# Patient Record
Sex: Male | Born: 1950
Health system: Southern US, Community
[De-identification: ages and names within clinical notes are randomized; demographics above are authoritative.]

## PROBLEM LIST (undated history)

## (undated) DIAGNOSIS — I10 Essential (primary) hypertension: Secondary | ICD-10-CM

## (undated) HISTORY — DX: Essential (primary) hypertension: I10

---

## 2015-04-20 ENCOUNTER — Other Ambulatory Visit: Payer: Self-pay | Admitting: Family Medicine

## 2015-04-21 LAB — CMP12+LP+TP+TSH+6AC+PSA+CBC…
A/G RATIO: 2 (ref 1.1–2.5)
ALBUMIN: 4.7 g/dL (ref 3.6–4.8)
ALT: 21 IU/L (ref 0–44)
AST: 13 IU/L (ref 0–40)
Alkaline Phosphatase: 62 IU/L (ref 39–117)
BASOS ABS: 0.1 10*3/uL (ref 0.0–0.2)
BILIRUBIN TOTAL: 0.5 mg/dL (ref 0.0–1.2)
BUN/Creatinine Ratio: 13 (ref 10–22)
BUN: 17 mg/dL (ref 8–27)
Basos: 0 %
CHOLESTEROL TOTAL: 229 mg/dL — AB (ref 100–199)
CREATININE: 1.29 mg/dL — AB (ref 0.76–1.27)
Calcium: 10 mg/dL (ref 8.6–10.2)
Chloride: 97 mmol/L (ref 97–108)
Chol/HDL Ratio: 7.2 ratio units — ABNORMAL HIGH (ref 0.0–5.0)
EOS (ABSOLUTE): 0.3 10*3/uL (ref 0.0–0.4)
Eos: 3 %
Estimated CHD Risk: 1.5 times avg. — ABNORMAL HIGH (ref 0.0–1.0)
FREE THYROXINE INDEX: 2.4 (ref 1.2–4.9)
GFR, EST AFRICAN AMERICAN: 67 mL/min/{1.73_m2} (ref >59)
GFR, EST NON AFRICAN AMERICAN: 58 mL/min/{1.73_m2} — AB (ref >59)
GGT: 39 IU/L (ref 0–65)
GLOBULIN, TOTAL: 2.4 g/dL (ref 1.5–4.5)
GLUCOSE: 114 mg/dL — AB (ref 65–99)
HDL: 32 mg/dL — AB (ref >39)
Hematocrit: 49.9 % (ref 37.5–51.0)
Hemoglobin: 17 g/dL (ref 12.6–17.7)
IMMATURE GRANS (ABS): 0 10*3/uL (ref 0.0–0.1)
IRON: 97 ug/dL (ref 38–169)
Immature Granulocytes: 0 %
LDH: 176 IU/L (ref 121–224)
LDL Calculated: 147 mg/dL — ABNORMAL HIGH (ref 0–99)
LYMPHS: 35 %
Lymphocytes Absolute: 4.2 10*3/uL — ABNORMAL HIGH (ref 0.7–3.1)
MCH: 31.5 pg (ref 26.6–33.0)
MCHC: 34.1 g/dL (ref 31.5–35.7)
MCV: 92 fL (ref 79–97)
MONOCYTES: 7 %
MONOS ABS: 0.8 10*3/uL (ref 0.1–0.9)
NEUTROS ABS: 6.6 10*3/uL (ref 1.4–7.0)
Neutrophils: 55 %
PHOSPHORUS: 3.4 mg/dL (ref 2.5–4.5)
PLATELETS: 380 10*3/uL — AB (ref 150–379)
POTASSIUM: 5.1 mmol/L (ref 3.5–5.2)
Prostate Specific Ag, Serum: 0.7 ng/mL (ref 0.0–4.0)
RBC: 5.4 x10E6/uL (ref 4.14–5.80)
RDW: 14 % (ref 12.3–15.4)
Sodium: 140 mmol/L (ref 134–144)
T3 UPTAKE RATIO: 26 % (ref 24–39)
T4 TOTAL: 9.4 ug/dL (ref 4.5–12.0)
TOTAL PROTEIN: 7.1 g/dL (ref 6.0–8.5)
TRIGLYCERIDES: 252 mg/dL — AB (ref 0–149)
TSH: 3.23 u[IU]/mL (ref 0.450–4.500)
Uric Acid: 7.6 mg/dL (ref 3.7–8.6)
VLDL CHOLESTEROL CAL: 50 mg/dL — AB (ref 5–40)
WBC: 12.1 10*3/uL — AB (ref 3.4–10.8)

## 2015-04-21 LAB — HGB A1C W/O EAG: HEMOGLOBIN A1C: 6.2 % — AB (ref 4.8–5.6)

## 2015-04-23 ENCOUNTER — Other Ambulatory Visit: Payer: Self-pay

## 2015-11-15 ENCOUNTER — Ambulatory Visit (INDEPENDENT_AMBULATORY_CARE_PROVIDER_SITE_OTHER): Payer: No Typology Code available for payment source

## 2015-11-15 ENCOUNTER — Ambulatory Visit (INDEPENDENT_AMBULATORY_CARE_PROVIDER_SITE_OTHER): Payer: No Typology Code available for payment source | Admitting: Emergency Medicine

## 2015-11-15 VITALS — BP 188/82 | HR 85 | Temp 98.0°F | Resp 18 | Ht 71.0 in | Wt 207.0 lb

## 2015-11-15 DIAGNOSIS — I10 Essential (primary) hypertension: Secondary | ICD-10-CM

## 2015-11-15 DIAGNOSIS — L293 Anogenital pruritus, unspecified: Secondary | ICD-10-CM | POA: Diagnosis not present

## 2015-11-15 DIAGNOSIS — R319 Hematuria, unspecified: Secondary | ICD-10-CM

## 2015-11-15 DIAGNOSIS — I152 Hypertension secondary to endocrine disorders: Secondary | ICD-10-CM | POA: Insufficient documentation

## 2015-11-15 DIAGNOSIS — K625 Hemorrhage of anus and rectum: Secondary | ICD-10-CM | POA: Diagnosis not present

## 2015-11-15 DIAGNOSIS — R030 Elevated blood-pressure reading, without diagnosis of hypertension: Secondary | ICD-10-CM

## 2015-11-15 DIAGNOSIS — IMO0001 Reserved for inherently not codable concepts without codable children: Secondary | ICD-10-CM

## 2015-11-15 DIAGNOSIS — E1159 Type 2 diabetes mellitus with other circulatory complications: Secondary | ICD-10-CM | POA: Insufficient documentation

## 2015-11-15 LAB — BASIC METABOLIC PANEL WITH GFR
BUN: 15 mg/dL (ref 7–25)
CO2: 27 mmol/L (ref 20–31)
CREATININE: 1.16 mg/dL (ref 0.70–1.25)
Calcium: 9.7 mg/dL (ref 8.6–10.3)
Chloride: 102 mmol/L (ref 98–110)
GFR, EST AFRICAN AMERICAN: 76 mL/min (ref >=60)
GFR, Est Non African American: 66 mL/min (ref >=60)
GLUCOSE: 116 mg/dL — AB (ref 65–99)
Potassium: 4.5 mmol/L (ref 3.5–5.3)
Sodium: 138 mmol/L (ref 135–146)

## 2015-11-15 LAB — POCT CBC
GRANULOCYTE PERCENT: 77.7 % (ref 37–80)
HEMATOCRIT: 45.7 % (ref 43.5–53.7)
HEMOGLOBIN: 16.4 g/dL (ref 14.1–18.1)
LYMPH, POC: 2.3 (ref 0.6–3.4)
MCH: 32.8 pg — AB (ref 27–31.2)
MCHC: 36 g/dL — AB (ref 31.8–35.4)
MCV: 91.1 fL (ref 80–97)
MID (cbc): 0.2 (ref 0–0.9)
MPV: 8.1 fL (ref 0–99.8)
POC GRANULOCYTE: 8.7 — AB (ref 2–6.9)
POC LYMPH PERCENT: 20.2 %L (ref 10–50)
POC MID %: 2.1 % (ref 0–12)
Platelet Count, POC: 307 10*3/uL (ref 142–424)
RBC: 5.01 M/uL (ref 4.69–6.13)
RDW, POC: 13.2 %
WBC: 11.2 10*3/uL — AB (ref 4.6–10.2)

## 2015-11-15 LAB — POCT URINALYSIS DIP (MANUAL ENTRY)
Bilirubin, UA: NEGATIVE
GLUCOSE UA: NEGATIVE
Ketones, POC UA: NEGATIVE
Leukocytes, UA: NEGATIVE
NITRITE UA: NEGATIVE
PROTEIN UA: NEGATIVE
RBC UA: NEGATIVE
SPEC GRAV UA: 1.02
Urobilinogen, UA: 0.2
pH, UA: 6.5

## 2015-11-15 LAB — POC MICROSCOPIC URINALYSIS (UMFC): MUCUS RE: ABSENT

## 2015-11-15 LAB — GLUCOSE, POCT (MANUAL RESULT ENTRY): POC Glucose: 114 mg/dl — AB (ref 70–99)

## 2015-11-15 MED ORDER — HYDROCORTISONE 1 % EX CREA: 1.0000 "application " | TOPICAL_CREAM | Freq: Two times a day (BID) | CUTANEOUS | Status: DC

## 2015-11-15 NOTE — Patient Instructions (Addendum)
   IF you received an x-ray today, you will receive an invoice from [facility]. Please contact [facility] at [phone] with questions or concerns regarding your invoice.   IF you received labwork today, you will receive an invoice from [facility]. Please contact [facility] at [phone] with questions or concerns regarding your invoice.   Our billing staff will not be able to assist you with questions regarding bills from these companies.  You will be contacted with the lab results as soon as they are available. The fastest way to get your results is to activate your My Chart account. Instructions are located on the last page of this paperwork. If you have not heard from us regarding the results in 2 weeks, please contact this office.     DASH Eating Plan DASH stands for "Dietary Approaches to Stop Hypertension." The DASH eating plan is a healthy eating plan that has been shown to reduce high blood pressure (hypertension). Additional health benefits may include reducing the risk of type 2 diabetes mellitus, heart disease, and stroke. The DASH eating plan may also help with weight loss. WHAT DO I NEED TO KNOW ABOUT THE DASH EATING PLAN? For the DASH eating plan, you will follow these general guidelines:  Choose foods with a percent daily value for sodium of less than 5% (as listed on the food label).  Use salt-free seasonings or herbs instead of table salt or sea salt.  Check with your health care provider or pharmacist before using salt substitutes.  Eat lower-sodium products, often labeled as "lower sodium" or "no salt added."  Eat fresh foods.  Eat more vegetables, fruits, and low-fat dairy products.  Choose whole grains. Look for the word "whole" as the first word in the ingredient list.  Choose fish and skinless chicken or turkey more often than red meat. Limit fish, poultry, and meat to 6 oz (170 g) each day.  Limit sweets,  desserts, sugars, and sugary drinks.  Choose heart-healthy fats.  Limit cheese to 1 oz (28 g) per day.  Eat more home-cooked food and less restaurant, buffet, and fast food.  Limit fried foods.  Cook foods using methods other than frying.  Limit canned vegetables. If you do use them, rinse them well to decrease the sodium.  When eating at a restaurant, ask that your food be prepared with less salt, or no salt if possible. WHAT FOODS CAN I EAT? Seek help from a dietitian for individual calorie needs. Grains Whole grain or whole wheat bread. Brown rice. Whole grain or whole wheat pasta. Quinoa, bulgur, and whole grain cereals. Low-sodium cereals. Corn or whole wheat flour tortillas. Whole grain cornbread. Whole grain crackers. Low-sodium crackers. Vegetables Fresh or frozen vegetables (raw, steamed, roasted, or grilled). Low-sodium or reduced-sodium tomato and vegetable juices. Low-sodium or reduced-sodium tomato sauce and paste. Low-sodium or reduced-sodium canned vegetables.  Fruits All fresh, canned (in natural juice), or frozen fruits. Meat and Other Protein Products Ground beef (85% or leaner), grass-fed beef, or beef trimmed of fat. Skinless chicken or turkey. Ground chicken or turkey. Pork trimmed of fat. All fish and seafood. Eggs. Dried beans, peas, or lentils. Unsalted nuts and seeds. Unsalted canned beans. Dairy Low-fat dairy products, such as skim or 1% milk, 2% or reduced-fat cheeses, low-fat ricotta or cottage cheese, or plain low-fat yogurt. Low-sodium or reduced-sodium cheeses. Fats and Oils Tub margarines without trans fats. Light or reduced-fat mayonnaise and salad dressings (reduced sodium). Avocado. Safflower, olive, or canola   oils. Natural peanut or almond butter. Other Unsalted popcorn and pretzels. The items listed above may not be a complete list of recommended foods or beverages. Contact your dietitian for more options. WHAT FOODS ARE NOT  RECOMMENDED? Grains White bread. White pasta. White rice. Refined cornbread. Bagels and croissants. Crackers that contain trans fat. Vegetables Creamed or fried vegetables. Vegetables in a cheese sauce. Regular canned vegetables. Regular canned tomato sauce and paste. Regular tomato and vegetable juices. Fruits Dried fruits. Canned fruit in light or heavy syrup. Fruit juice. Meat and Other Protein Products Fatty cuts of meat. Ribs, chicken wings, bacon, sausage, bologna, salami, chitterlings, fatback, hot dogs, bratwurst, and packaged luncheon meats. Salted nuts and seeds. Canned beans with salt. Dairy Whole or 2% milk, cream, half-and-half, and cream cheese. Whole-fat or sweetened yogurt. Full-fat cheeses or blue cheese. Nondairy creamers and whipped toppings. Processed cheese, cheese spreads, or cheese curds. Condiments Onion and garlic salt, seasoned salt, table salt, and sea salt. Canned and packaged gravies. Worcestershire sauce. Tartar sauce. Barbecue sauce. Teriyaki sauce. Soy sauce, including reduced sodium. Steak sauce. Fish sauce. Oyster sauce. Cocktail sauce. Horseradish. Ketchup and mustard. Meat flavorings and tenderizers. Bouillon cubes. Hot sauce. Tabasco sauce. Marinades. Taco seasonings. Relishes. Fats and Oils Butter, stick margarine, lard, shortening, ghee, and bacon fat. Coconut, palm kernel, or palm oils. Regular salad dressings. Other Pickles and olives. Salted popcorn and pretzels. The items listed above may not be a complete list of foods and beverages to avoid. Contact your dietitian for more information. WHERE CAN I FIND MORE INFORMATION? Place rectal bleeding patient instructions here.

## 2015-11-15 NOTE — Progress Notes (Addendum)
By signing my name below, I, Raven Small, attest that this documentation has been prepared under the direction and in the presence of Nena Jordan, MD.  Electronically Signed: Thea Alken, ED Scribe. 11/15/2015. 12:15 PM.  Chief Complaint:  Chief Complaint  Patient presents with  . Hematuria    x 1 month    HPI: David Jacobs is a 65 y.o. male who reports to [facility] today complaining of hematuria for 1 month. Pt has noticed a light red tint of blood in his urine for the past month. He also mentions his genital itch during the night. He has not had his prostate check. Pt denies urinary retention, dysuria, decreased appetite, dizziness, light headedness. Pt is a smoker. When went back in the room to go over results his stepdaughter said that he was having rectal bleeding rather than hematuria. He states that when he wipes after a bowel movement he does see some blood.  Past Medical History  Diagnosis Date  . Hypertension    History reviewed. No pertinent past surgical history. Social History   Social History  . Marital Status: Married    Spouse Name: N/A  . Number of Children: N/A  . Years of Education: N/A   Social History Main Topics  . Smoking status: Current Every Day Smoker  . Smokeless tobacco: None  . Alcohol Use: None  . Drug Use: None  . Sexual Activity: Not Asked   Other Topics Concern  . None   Social History Narrative  . None   Family History  Problem Relation Age of Onset  . Cancer Mother   . Hyperlipidemia Mother   . Diabetes Father    No Known Allergies Prior to Admission medications   Medication Sig Start Date End Date Taking? Authorizing Provider  losartan-hydrochlorothiazide (HYZAAR) 100-25 MG tablet Take 1 tablet by mouth daily.   Yes Historical Provider, MD     ROS: The patient denies fevers, chills, night sweats, unintentional weight loss, chest pain, palpitations, wheezing, dyspnea on exertion, nausea, vomiting, abdominal pain, dysuria,  hematuria, melena, numbness, weakness, or tingling.  All other systems have been reviewed and were otherwise negative with the exception of those mentioned in the HPI and as above.    PHYSICAL EXAM: Filed Vitals:   11/15/15 1148  BP: 188/82  Pulse: 85  Temp: 98 F (36.7 C)  Resp: 18   Body mass index is 28.88 kg/(m^2).   General: Alert, no acute distress HEENT:  Normocephalic, atraumatic, oropharynx patent. Eye: [facility]te M[facility] Cardiovascular:  Regular rate and rhythm, no rubs murmurs or gallops.  No Carotid bruits, radial pulse intact. No pedal edema.  Respiratory: Clear to auscultation bilaterally.  No wheezes, rales, or rhonchi.  No cyanosis, no use of accessory musculature Abdominal: No organomegaly, abdomen is soft and non-tender, positive bowel sounds.  No masses. Musculoskeletal: Gait intact. No edema, tenderness Skin: No rashes. Neurologic: Facial musculature symmetric. Psychiatric: Patient acts appropriately throughout our interaction. Lymphatic: No cervical or submandibular lymphadenopathy GU: normal uncircumcised male with whitish discharge beneath foreskin. Prostate normal size. No masses. There was no blood noted on the examining glove.   LABS: Results for orders placed or performed in visit on 11/15/15  POCT CBC  Result Value Ref Range   WBC 11.2 (A) 4.6 - 10.2 K/uL   Lymph, poc 2.3 0.6 - 3.4   POC LYMPH PERCENT 20.2 10 - 50 %L   MID (cbc) 0.2 0 - 0.9   POC MID % 2.1 0 -  12 %M   POC Granulocyte 8.7 (A) 2 - 6.9   Granulocyte percent 77.7 37 - 80 %G   RBC 5.01 4.69 - 6.13 M/uL   Hemoglobin 16.4 14.1 - 18.1 g/dL   HCT, POC 45.7 43.5 - 53.7 %   MCV 91.1 80 - 97 fL   MCH, POC 32.8 (A) 27 - 31.2 pg   MCHC 36.0 (A) 31.8 - 35.4 g/dL   RDW, POC 13.2 %   Platelet Count, POC 307 142 - 424 K/uL   MPV 8.1 0 - 99.8 fL  POCT glucose (manual entry)  Result Value Ref Range   POC Glucose 114 (A) 70 - 99 mg/dl  POCT urinalysis dipstick  Result Value Ref Range   Color,  UA yellow yellow   Clarity, UA clear clear   Glucose, UA negative negative   Bilirubin, UA negative negative   Ketones, POC UA negative negative   Spec Grav, UA 1.020    Blood, UA negative negative   pH, UA 6.5    Protein Ur, POC negative negative   Urobilinogen, UA 0.2    Nitrite, UA Negative Negative   Leukocytes, UA Negative Negative  POCT Microscopic Urinalysis ([facility])  Result Value Ref Range   WBC,UR,HPF,POC None None WBC/hpf   RBC,UR,HPF,POC None None RBC/hpf   Bacteria None None, Too numerous to count   Mucus Absent Absent   Epithelial Cells, UR Per Microscopy None None, Too numerous to count cells/hpf    Primary read interpreted by Dr. Everlene Farrier at [facility].  Dg Abd Acute W/chest  11/15/2015  CLINICAL DATA:  Hematuria. EXAM: DG ABDOMEN ACUTE W/ 1V CHEST COMPARISON:  None. FINDINGS: There is no evidence of dilated bowel loops or free intraperitoneal air. Pelvic vascular calcifications noted, but no definite radiopaque calculi identified. Heart size and mediastinal contours are within normal limits. Both lungs are clear. No evidence of pneumothorax or pleural effusion. IMPRESSION: No acute findings.  No active cardiopulmonary disease. Electronically Signed   By: Earle Gell M.D.   On: 11/15/2015 13:55     ASSESSMENT/PLAN: No blood in the urine today. He was referred to GI for colonoscopy. He has never had a colonoscopy.Marland Kitchen He was hypertensive today and started on amlodipine 5 mg a day repeat evaluation 1 month.I personally performed the services described in this documentation, which was scribed in my presence. The recorded information has been reviewed and is accurate. He states he has been taking his Hyzaar.   Gross sideeffects, risk and benefits, and alternatives of medications d/w patient. Patient is aware that all medications have potential sideeffects and we are unable to predict every sideeffect or drug-drug interaction that may occur.  Arlyss Queen MD 11/15/2015 12:11 PM

## 2015-11-16 ENCOUNTER — Telehealth: Payer: Self-pay

## 2015-11-16 LAB — PSA: PSA: 0.64 ng/mL (ref <=4.00)

## 2015-11-16 NOTE — Telephone Encounter (Signed)
Patient's daughter in law called on behalf of Mr. Aroyo. Patient is requesting another prescription for his blood pressure. [place] [facility] [place] [phone].

## 2015-11-17 MED ORDER — AMLODIPINE BESYLATE 5 MG PO TABS: 5.0000 mg | ORAL_TABLET | Freq: Every day | ORAL | Status: DC

## 2015-11-17 NOTE — Telephone Encounter (Signed)
Dr Everlene Farrier wrote in [place] plans that he wanted pt to start on amlodipine, copied from notes here: He was hypertensive today and started on amlodipine 5 mg a day   Sent in new Rx for just 30 day w/1 RF. Pt is supposed to f/up in 30 days.

## 2015-11-23 ENCOUNTER — Other Ambulatory Visit: Payer: Self-pay | Admitting: Gastroenterology

## 2015-11-23 DIAGNOSIS — C189 Malignant neoplasm of colon, unspecified: Secondary | ICD-10-CM

## 2015-11-25 ENCOUNTER — Ambulatory Visit
Admission: RE | Admit: 2015-11-25 | Discharge: 2015-11-25 | Disposition: A | Discharge status: Home / Self Care | Payer: PRIVATE HEALTH INSURANCE | Source: Ambulatory Visit | Attending: Gastroenterology | Admitting: Gastroenterology

## 2015-11-25 DIAGNOSIS — C189 Malignant neoplasm of colon, unspecified: Secondary | ICD-10-CM

## 2015-11-25 MED ORDER — IOPAMIDOL (ISOVUE-300) INJECTION 61%
125.0000 mL | Freq: Once | INTRAVENOUS | Status: AC | PRN
  Administered 2015-11-25: 125 mL via INTRAVENOUS

## 2015-11-30 ENCOUNTER — Telehealth: Payer: Self-pay | Admitting: *Deleted

## 2015-11-30 NOTE — Telephone Encounter (Signed)
Oncology Nurse Navigator Documentation  Oncology Nurse Navigator Flowsheets 11/30/2015  Navigator Location [facility]  Navigator Encounter Type Introductory phone call  Abnormal Finding Date 11/15/2015  Confirmed Diagnosis Date 11/27/2015   Spoke with daughter-in-law, David Jacobs and provided new patient appointment for 12/07/15 at 11:15 to see Dr. Burr Medico. Informed of location of [place], valet service, and registration process. Reminded to bring insurance cards and a current medication list, including supplements. She  verbalizes understanding. Patient speaks minimal English. David Jacobs will be going to [place] this summer so she will be adding her husband as the contact person in her absence. They are seeing another physician on 5/15 at 0930. Sent message to Dr. Ida Rogue scheduler to request he be seen in the afternoon of 12/07/15 if possible. David Jacobs is taking the day off from work to take her father-in-law to all his appointments.

## 2015-12-01 ENCOUNTER — Encounter: Payer: Self-pay | Admitting: Radiation Oncology

## 2015-12-03 NOTE — Progress Notes (Signed)
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 Debroah Loop: Colonoscopy with polypectomy  11/23/15  EUS  On Wednesday 12/09/15, chemo education next week   Past/Anticipated interventions by medical oncology, if any:Dr. Burr Medico seen today 12/07/15 Weight changes, if any:   Bowel/Bladder complaints, if any: yes rectal in the stool,no hematuria now, bowel movements daily  Nausea / Vomiting, if any: NO  Pain issues, if any: No  Any blood per rectum:  Tissue and in stool  SAFETY ISSUES:  Prior radiation? NO  Pacemaker/ICD? NO  Is the patient on methotrexate? NO  Current Complaints/Details:Married,Current cigarette smoker  1/2ppd, x 35 years, no smokeless tobacco, ,alcohol rare use,no illicit drug use   Mother cancer deceased,Thyroid, metastasized to liver,  Father deceased DM, Interpreter  [name]  Interpreter [name]   BP 150/74 mmHg  Pulse 70  Temp(Src) 98.4 F (36.9 C) (Oral)  Resp 20  Ht 5\' 11"  (1.803 m)  Wt 197 lb 6.4 oz (89.54 kg)  BMI 27.54 kg/m2  SpO2 100%  Wt Readings from Last 3 Encounters:  12/07/15 197 lb 6.4 oz (89.54 kg)  12/07/15 198 lb 3.2 oz (89.903 kg)  11/15/15 207 lb (93.895 kg)

## 2015-12-07 ENCOUNTER — Encounter: Payer: Self-pay | Admitting: *Deleted

## 2015-12-07 ENCOUNTER — Ambulatory Visit
Admission: RE | Admit: 2015-12-07 | Discharge: 2015-12-07 | Disposition: A | Discharge status: Home / Self Care | Payer: No Typology Code available for payment source | Source: Ambulatory Visit | Attending: Radiation Oncology | Admitting: Radiation Oncology

## 2015-12-07 ENCOUNTER — Encounter: Payer: Self-pay | Admitting: Hematology

## 2015-12-07 ENCOUNTER — Ambulatory Visit (HOSPITAL_BASED_OUTPATIENT_CLINIC_OR_DEPARTMENT_OTHER): Payer: PRIVATE HEALTH INSURANCE | Admitting: Hematology

## 2015-12-07 ENCOUNTER — Encounter: Payer: Self-pay | Admitting: Radiation Oncology

## 2015-12-07 VITALS — BP 167/64 | HR 77 | Temp 98.4°F | Ht 71.0 in | Wt 198.2 lb

## 2015-12-07 VITALS — BP 150/74 | HR 70 | Temp 98.4°F | Resp 20 | Ht 71.0 in | Wt 197.4 lb

## 2015-12-07 DIAGNOSIS — C20 Malignant neoplasm of rectum: Secondary | ICD-10-CM | POA: Diagnosis not present

## 2015-12-07 DIAGNOSIS — I10 Essential (primary) hypertension: Secondary | ICD-10-CM | POA: Insufficient documentation

## 2015-12-07 DIAGNOSIS — Z510 Encounter for antineoplastic radiation therapy: Secondary | ICD-10-CM | POA: Insufficient documentation

## 2015-12-07 DIAGNOSIS — Z809 Family history of malignant neoplasm, unspecified: Secondary | ICD-10-CM | POA: Insufficient documentation

## 2015-12-07 NOTE — Progress Notes (Signed)
Oncology Nurse Navigator Documentation  Oncology Nurse Navigator Flowsheets 12/07/2015  Navigator Location [facility]  Navigator Encounter Type Initial MedOnc  Abnormal Finding Date -  Confirmed Diagnosis Date -  Patient Visit Type MedOnc;Initial  Treatment Phase Pre-Tx/Tx Discussion  Barriers/Navigation Needs Family concerns;Education  Education Understanding Cancer/ Treatment Options;Coping with Diagnosis/ Prognosis;Newly Diagnosed Cancer Education;Preparing for Upcoming Surgery/ Treatment;Other  Interventions Coordination of Care--escorted patient to registration for radiation oncology  Referrals   Support Groups/Services [place]  Acuity Level 2  Time Spent with Patient 75  Met with patient, daughter-in-law [name]  and interpreter during new patient visit. Explained the role of the GI Nurse Navigator and provided New Patient Packet with information on: 1. Colorectal cancer--provided handouts for daughter to read and interpret for him on nutrition, exercise/staying active and anatomy of colon and rectum and where his tumor is located 2. Support groups 3. Advanced Directives 4. Fall Safety Plan Answered questions, reviewed current treatment plan using TEACH back and provided emotional support. Provided copy of current treatment plan. Family reports he does not drive, but they have at least three people in family that will provide transportation. He will bring in his disability paperwork next week and family will provide FMLA forms also. He verbalizes understanding of the treatment plan and wants to pursue treatment to be well again. He does continue to be a 1/2 ppd smoker with no plans to stop. He is aware of harm cigarettes cause his lungs, BP and healing. He does not have a PCP, but goes to [facility] for his primary care needs, and they do follow his blood pressure.   Merceda Elks, RN, BSN GI Oncology [place]

## 2015-12-07 NOTE — Progress Notes (Signed)
Radiation Oncology         [phone] ________________________________  Name: David Jacobs MRN: [id number]  Date: 12/07/2015  DOB: [birth year]  CC:No primary care provider on file.  [name], MD     REFERRING PHYSICIAN: [name], MD   DIAGNOSIS: The encounter diagnosis was Rectal cancer [facility]).  Adenocarcinoma of the rectum, staging in process.    HISTORY OF PRESENT ILLNESS:Hektor Verville is a 65 y.o. male who is seen for an initial consultation visit. He presented with rectal bleeding for three months as well as hematuria. He had a colonoscopy with polypectomy 11/23/2015. The pathology revealed invasive adenocarcinoma. He had a CT of the chest, abdomen, and pelvis and showed focal wall thickening involving the low rectum, no findings suspicious for metastatic disease. He is scheduled to undergo EUS with Dr. Ardis Hughs in the next week. He also met with Dr. Burr Medico who discussed the role of Xeloda with radiotherapy if he has nodal involvement. He is here to discuss the possible role for radiation therapy.    PREVIOUS RADIATION THERAPY: No   PAST MEDICAL HISTORY:  Past Medical History  Diagnosis Date  . Hypertension     PAST SURGICAL HISTORY:No past surgical history on file.   FAMILY HISTORY:  Family History  Problem Relation Age of Onset  . Cancer Mother 62    throid cancer   . Hyperlipidemia Mother   . Diabetes Father     SOCIAL HISTORY:  reports that he has been smoking.  He does not have any smokeless tobacco history on file. He reports that he drinks about 1.2 - 1.8 oz of alcohol per week. He is married and resides in [place]. 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   ALLERGIES: Review of patient's allergies indicates no known allergies.   MEDICATIONS:  Current Outpatient Prescriptions  Medication Sig Dispense Refill  . amLODipine (NORVASC) 5 MG tablet Take 1 tablet (5 mg total) by mouth daily. 30 tablet 1  . hydrocortisone cream 1 % Apply 1  application topically 2 (two) times daily. 30 g 0  . losartan-hydrochlorothiazide (HYZAAR) 100-25 MG tablet Take 1 tablet by mouth daily.     No current facility-administered medications for this encounter.     REVIEW OF SYSTEMS: On review of systems, the patient reports that he is doing well overall. He denies any chest pain, shortness of breath, cough, fevers, chills, night sweats, unintended weight changes. He mentions he has blood in his stool and has a bowel movement daily. He had hematuria but is not having any currently. He denies any bladder disturbances, and denies abdominal pain, nausea or vomiting. He denies any new musculoskeletal or joint aches or pains. A complete review of systems is obtained and is otherwise negative.    PHYSICAL EXAM:  height is 5' 11"  (1.803 m) and weight is 197 lb 6.4 oz (89.54 kg). His oral temperature is 98.4 F (36.9 C). His blood pressure is 150/74 and his pulse is 70. His respiration is 20 and oxygen saturation is 100%.   Pain scale 0/10 In general this is a well appearing [place] in no acute distress. He is alert and oriented x4 and appropriate throughout the examination. HEENT reveals that the patient is normocephalic, atraumatic. EOMs are intact. PERRLA. Skin is intact without any evidence of gross lesions. Cardiovascular exam reveals a regular rate and rhythm, no clicks rubs or murmurs are auscultated. Chest is clear to auscultation bilaterally. Lymphatic assessment is performed and does not reveal any  adenopathy in the cervical, supraclavicular, axillary, or inguinal chains. Abdomen has active bowel sounds in all quadrants and is intact. The abdomen is soft, non tender, non distended. Lower extremities are negative for pretibial pitting edema, deep calf tenderness, cyanosis or clubbing.   ECOG = 1  0 - Asymptomatic (Fully active, able to carry on all predisease activities without restriction)  1 - Symptomatic but completely ambulatory  (Restricted in physically strenuous activity but ambulatory and able to carry out work of a light or sedentary nature. For example, light housework, office work)  2 - Symptomatic, <50% in bed during the day (Ambulatory and capable of all self care but unable to carry out any work activities. Up and about more than 50% of waking hours)  3 - Symptomatic, >50% in bed, but not bedbound (Capable of only limited self-care, confined to bed or chair 50% or more of waking hours)  4 - Bedbound (Completely disabled. Cannot carry on any self-care. Totally confined to bed or chair)  5 - Death   Eustace Pen MM, Creech RH, Tormey DC, et al. [id number]). "Toxicity and response criteria of the [facility] Group". [place] Oncol. 5 (6): 649-55   LABORATORY DATA:  Lab Results  Component Value Date   WBC 11.2* 11/15/2015   HGB 16.4 11/15/2015   HCT 45.7 11/15/2015   MCV 91.1 11/15/2015   PLT 380* 04/20/2015   Lab Results  Component Value Date   NA 138 11/15/2015   K 4.5 11/15/2015   CL 102 11/15/2015   CO2 27 11/15/2015   Lab Results  Component Value Date   ALT 21 04/20/2015   AST 13 04/20/2015   GGT 39 04/20/2015   ALKPHOS 62 04/20/2015   BILITOT 0.5 04/20/2015      RADIOGRAPHY: Ct Chest W Contrast  11/25/2015  CLINICAL DATA:  Newly diagnosed colon cancer, evaluate for metastatic disease EXAM: CT CHEST, ABDOMEN, AND PELVIS WITH CONTRAST TECHNIQUE: Multidetector CT imaging of the chest, abdomen and pelvis was performed following the standard protocol during bolus administration of intravenous contrast. CONTRAST:  133m ISOVUE-300 IOPAMIDOL (ISOVUE-300) INJECTION 61% COMPARISON:  None. FINDINGS: CT CHEST FINDINGS Mediastinum/Nodes: Heart is normal in size. No pericardial effusion. Coronary atherosclerosis in the LAD. No evidence of thoracic aortic aneurysm. No suspicious mediastinal lymphadenopathy. Visualized thyroid is unremarkable. Lungs/Pleura: Evaluation of the lung parenchyma is  constrained by respiratory motion. 3-4 mm nodule along the right major fissure (series 6/image 29), likely reflecting a subpleural lymph node. No suspicious pulmonary nodules. Mild dependent atelectasis in the bilateral lower lobes. No focal consolidation. No pleural effusion or pneumothorax. Musculoskeletal: Degenerative changes of the thoracic spine. CT ABDOMEN PELVIS FINDINGS Motion degraded images. Hepatobiliary: 12 mm cyst in the central right hepatic lobe (series 2/ image 52). Liver is otherwise within normal limits. No suspicious/enhancing hepatic lesions. Gallbladder is unremarkable. No intrahepatic or extrahepatic ductal dilatation. Pancreas: Within normal limits. Spleen: Within normal limits. Adrenals/Urinary Tract: Adrenal glands within normal limits. Kidneys are within normal limits.  No hydronephrosis. Bladder is within normal limits. Stomach/Bowel: Stomach is within normal limits. No evidence of bowel obstruction. Normal appendix (series 2/ image 96). Focal wall thickening/mucosal hyperenhancement involving the lower rectum (series 2/images 112-117), worrisome for rectal adenocarcinoma. Vascular/Lymphatic: Atherosclerotic calcifications of the abdominal aorta and branch vessels. No evidence of abdominal aortic aneurysm. No suspicious abdominopelvic lymphadenopathy. Specifically, no suspicious perirectal lymphadenopathy. Reproductive: Prostate is unremarkable. Other: No abdominopelvic ascites. Musculoskeletal: Degenerative changes of the lumbar spine. Grade 1 spondylolisthesis at L5-S1. IMPRESSION:  Focal wall thickening involving the lower rectum, worrisome for rectal adenocarcinoma. No findings suspicious for metastatic disease. Electronically Signed   By: Julian Hy M.D.   On: 11/25/2015 16:48   Ct Abdomen Pelvis W Contrast  11/25/2015  CLINICAL DATA:  Newly diagnosed colon cancer, evaluate for metastatic disease EXAM: CT CHEST, ABDOMEN, AND PELVIS WITH CONTRAST TECHNIQUE: Multidetector CT  imaging of the chest, abdomen and pelvis was performed following the standard protocol during bolus administration of intravenous contrast. CONTRAST:  180m ISOVUE-300 IOPAMIDOL (ISOVUE-300) INJECTION 61% COMPARISON:  None. FINDINGS: CT CHEST FINDINGS Mediastinum/Nodes: Heart is normal in size. No pericardial effusion. Coronary atherosclerosis in the LAD. No evidence of thoracic aortic aneurysm. No suspicious mediastinal lymphadenopathy. Visualized thyroid is unremarkable. Lungs/Pleura: Evaluation of the lung parenchyma is constrained by respiratory motion. 3-4 mm nodule along the right major fissure (series 6/image 29), likely reflecting a subpleural lymph node. No suspicious pulmonary nodules. Mild dependent atelectasis in the bilateral lower lobes. No focal consolidation. No pleural effusion or pneumothorax. Musculoskeletal: Degenerative changes of the thoracic spine. CT ABDOMEN PELVIS FINDINGS Motion degraded images. Hepatobiliary: 12 mm cyst in the central right hepatic lobe (series 2/ image 52). Liver is otherwise within normal limits. No suspicious/enhancing hepatic lesions. Gallbladder is unremarkable. No intrahepatic or extrahepatic ductal dilatation. Pancreas: Within normal limits. Spleen: Within normal limits. Adrenals/Urinary Tract: Adrenal glands within normal limits. Kidneys are within normal limits.  No hydronephrosis. Bladder is within normal limits. Stomach/Bowel: Stomach is within normal limits. No evidence of bowel obstruction. Normal appendix (series 2/ image 96). Focal wall thickening/mucosal hyperenhancement involving the lower rectum (series 2/images 112-117), worrisome for rectal adenocarcinoma. Vascular/Lymphatic: Atherosclerotic calcifications of the abdominal aorta and branch vessels. No evidence of abdominal aortic aneurysm. No suspicious abdominopelvic lymphadenopathy. Specifically, no suspicious perirectal lymphadenopathy. Reproductive: Prostate is unremarkable. Other: No abdominopelvic  ascites. Musculoskeletal: Degenerative changes of the lumbar spine. Grade 1 spondylolisthesis at L5-S1. IMPRESSION: Focal wall thickening involving the lower rectum, worrisome for rectal adenocarcinoma. No findings suspicious for metastatic disease. Electronically Signed   By: SJulian HyM.D.   On: 11/25/2015 16:48   Dg Abd Acute W/chest  11/15/2015  CLINICAL DATA:  Hematuria. EXAM: DG ABDOMEN ACUTE W/ 1V CHEST COMPARISON:  None. FINDINGS: There is no evidence of dilated bowel loops or free intraperitoneal air. Pelvic vascular calcifications noted, but no definite radiopaque calculi identified. Heart size and mediastinal contours are within normal limits. Both lungs are clear. No evidence of pneumothorax or pleural effusion. IMPRESSION: No acute findings.  No active cardiopulmonary disease. Electronically Signed   By: JEarle GellM.D.   On: 11/15/2015 13:55       IMPRESSION: Mr. MMacauleyis a 65yo male with adenocarcinoma of the rectum which is currently being formally worked up with staging EUS.    PLAN: The patient is currently undergoing staging workup and Dr. MLisbeth Renshawhas discussed what if he had a T3 lesion or nodal disease that there would be a role for radiotherapy. He discusses that if radiation were given, it would be in the preoperative setting for approximately  5 1/2 weeks followed by subsequent surgical resection. He has already met with Dr. FBurr Medicoand if radiotherapy is performed, this would be in conjuction with oral Xeloda. We discussed the risks, benefits, long and shorterm effects of radiotherapy. We will follow up with the results of the EUS and move forward as appropriate, and have  him scheduled tentatively for his simulation appointment 12/14/2015 at 11 am.   The above  documentation reflects my direct findings during this shared patient visit. Please see the separate note by Dr. Lisbeth Renshaw on this date for the remainder of the patient's plan of care.    Carola Rhine,  PAC   This document serves as a record of services personally performed by Kyung Rudd, MD and Shona Simpson, PAC. It was created on their behalf by  Lendon Collar, a trained medical scribe. The creation of this record is based on the scribe's personal observations and the provider's statements to them. This document has been checked and approved by the attending provider.

## 2015-12-07 NOTE — Patient Instructions (Signed)
    Care Plan Summary- 12/07/2015 Name: David Jacobs    DOB: 11-Jan-1951   Your Medical Team: Medical Oncologist:  Dr. Truitt Merle Radiation Oncologist:  Dr. Kyung Rudd  Surgeon:    Dr. Leighton Ruff Type of Cancer: Adenocarcinoma of rectum  Stage/Grade: unknown  *Exact staging of your cancer is based on size of the tumor, depth of invasion, involvement of lymph nodes or not, and whether or not the cancer has spread beyond the primary site.   Recommendations: Based on information available as of today's consult. Recommendations may change depending on the results of further tests or exams. 1) EUS on Wednesday w/Dr. Paulita Fujita for staging 2) Chemotherapy (Xeloda) with radiation therapy M-F X 6 weeks, followed by surgery (in 6 weeks); may need chemotherapy after surgery 3)  Next Steps: 1) See Dr. Lisbeth Renshaw as scheduled today and Dr. Paulita Fujita on Wednesday 2) Labs with chemo teaching class next week  3) Bring in your disability and FMLA forms next week Questions? Merceda Elks, RN, BSN at [phone]. Manuela Schwartz is your Oncology Nurse Navigator and is available to assist you while you're receiving your medical care at [facility]. If cancer is stage II: 70-80 % chance of cure You will be followed every 3-6 months for 5 years You will need a colonoscopy in 1 year

## 2015-12-07 NOTE — Progress Notes (Signed)
[place]  [phone] Fax:([phone]  Clinic New Consult Note   No care team member to display 12/07/2015  Referring physician:Dr. Outlaw  CHIEF COMPLAINTS/PURPOSE OF CONSULTATION:  Newly diagnosed rectal cancer    Rectal cancer ([place])   11/23/2015 Initial Diagnosis Rectal cancer ([place])   11/23/2015 Procedure Colonoscopy showed a large fungating ulcerated partially obstructing mass in the proximal rectum. This was biopsied.   11/23/2015 Initial Biopsy Rectal mass biopsy showed adenocarcinoma   11/25/2015 Imaging CT chest, abdomen and pelvis with contrast showed focal wall thickening involving the low rectum, no findings suspicious for metastatic disease.    HISTORY OF PRESENTING ILLNESS:  David Jacobs 65 y.o. male is here because of His recently diagnosed rectal cancer. He is accompanied by an interpreter and daughter-in-law [name],  to the clinic today.  He has been having rectal bleeding for 3 months, stable overall, small amount, usually once a day, no pain, he otherwise feels well, no fatigue or weight change lately. He was referred by his primary care physician to gastroenterologist Dr. Paulita Fujita, and underwent colonoscopy on 11/23/2015. It showed a fungating ulcerated partially obstructing mass in the proximal rectum, They showed adenocarcinoma. CT scan revealed no evidence of distant metastasis.He was referred to [place] for further management.  He works for the [employer]still works full-time. He livers with his wife, one of his son and his family. He never had a colonoscopy before he reason one. No family history of colon cancer.    MEDICAL HISTORY:  Past Medical History  Diagnosis Date  . Hypertension     SURGICAL HISTORY: Past Surgical History  Procedure Laterality Date  . Eus N/A 12/09/2015    Procedure: LOWER ENDOSCOPIC ULTRASOUND (EUS);  Surgeon: [name], MD;  Location: [facility];  Service: Endoscopy;  Laterality: N/A;     SOCIAL HISTORY: Social History   Social History  . Marital Status: Married    Spouse Name: N/A  . Number of Children: 2  . Years of Education: N/A   Occupational History  . Not on file.   Social History Main Topics  . Smoking status: Current Every Day Smoker -- 0.50 packs/day for 35 years  . Smokeless tobacco: Not on file  . Alcohol Use: 1.2 - 1.8 oz/week    2-3 Cans of beer, 0 Standard drinks or equivalent per week     Comment: a few beers a week   . Drug Use: Not on file  . Sexual Activity: Not on file   Other Topics Concern  . Not on file   Social History Narrative   Married, lives w/his wife and a son--minimal English   Works full time at [employer]   Current everyday smoker, with no plans to quit   Primary contact is daughter-in-law, Witt Plitt (can interpret for him)    FAMILY HISTORY: Family History  Problem Relation Age of Onset  . Cancer Mother 63    throid cancer   . Hyperlipidemia Mother   . Diabetes Father     ALLERGIES:  has No Known Allergies.  MEDICATIONS:  Current Outpatient Prescriptions  Medication Sig Dispense Refill  . amLODipine (NORVASC) 5 MG tablet Take 1 tablet (5 mg total) by mouth daily. 30 tablet 1  . hydrocortisone cream 1 % Apply 1 application topically 2 (two) times daily. 30 g 0  . losartan-hydrochlorothiazide (HYZAAR) 100-25 MG tablet Take 1 tablet by mouth daily.    . capecitabine (XELODA) 500 MG tablet Take 4 tabs (2013m)  by mouth in the morning & 3 tabs (1541m) by mouth in the evening on the days of radiation only. Take with food. 105 tablet 1   No current facility-administered medications for this visit.    REVIEW OF SYSTEMS:   Constitutional: Denies fevers, chills or abnormal night sweats Eyes: Denies blurriness of vision, double vision or watery eyes Ears, nose, mouth, throat, and face: Denies mucositis or sore throat Respiratory: Denies cough, dyspnea or wheezes Cardiovascular: Denies palpitation,  chest discomfort or lower extremity swelling Gastrointestinal:  Denies nausea, heartburn or change in bowel habits Skin: Denies abnormal skin rashes Lymphatics: Denies new lymphadenopathy or easy bruising Neurological:Denies numbness, tingling or new weaknesses Behavioral/Psych: Mood is stable, no new changes  All other systems were reviewed with the patient and are negative.  PHYSICAL EXAMINATION: ECOG PERFORMANCE STATUS: 0 - Asymptomatic  Filed Vitals:   12/07/15 1128  BP: 167/64  Pulse: 77  Temp: 98.4 F (36.9 C)   Filed Weights   12/07/15 1128  Weight: 198 lb 3.2 oz (89.903 kg)    GENERAL:alert, no distress and comfortable SKIN: skin color, texture, turgor are normal, no rashes or significant lesions EYES: normal, conjunctiva are pink and non-injected, sclera clear OROPHARYNX:no exudate, no erythema and lips, buccal mucosa, and tongue normal  NECK: supple, thyroid normal size, non-tender, without nodularity LYMPH:  no palpable lymphadenopathy in the cervical, axillary or inguinal LUNGS: clear to auscultation and percussion with normal breathing effort HEART: regular rate & rhythm and no murmurs and no lower extremity edema ABDOMEN:abdomen soft, non-tender and normal bowel sounds, no organomegaly, rectal exam was deferred due to his multiple exam lately.  Musculoskeletal:no cyanosis of digits and no clubbing  PSYCH: alert & oriented x 3 with fluent speech NEURO: no focal motor/sensory deficits  LABORATORY DATA:  I have reviewed the data as listed Lab Results  Component Value Date   WBC 11.2* 11/15/2015   HGB 16.4 11/15/2015   HCT 45.7 11/15/2015   MCV 91.1 11/15/2015   PLT 380* 04/20/2015    Recent Labs  04/20/15 0945 11/15/15 1249  NA 140 138  K 5.1 4.5  CL 97 102  CO2  --  27  GLUCOSE 114* 116*  BUN 17 15  CREATININE 1.29* 1.16  CALCIUM 10.0 9.7  GFRNONAA 58* 66  GFRAA 67 76  PROT 7.1  --   ALBUMIN 4.7  --   AST 13  --   ALT 21  --   ALKPHOS 62   --   BILITOT 0.5  --    PATHOLOGY REPORT: Rectum biopsy 11/23/2015 -Invasive adenocarcinoma  Microscopic comment Interval Departmental review obtained (Dr. KLyndon Code with agreement.  RADIOGRAPHIC STUDIES: I have personally reviewed the radiological images as listed and agreed with the findings in the report. Ct Chest W Contrast  11/25/2015  CLINICAL DATA:  Newly diagnosed colon cancer, evaluate for metastatic disease EXAM: CT CHEST, ABDOMEN, AND PELVIS WITH CONTRAST TECHNIQUE: Multidetector CT imaging of the chest, abdomen and pelvis was performed following the standard protocol during bolus administration of intravenous contrast. CONTRAST:  1255mISOVUE-300 IOPAMIDOL (ISOVUE-300) INJECTION 61% COMPARISON:  None. FINDINGS: CT CHEST FINDINGS Mediastinum/Nodes: Heart is normal in size. No pericardial effusion. Coronary atherosclerosis in the LAD. No evidence of thoracic aortic aneurysm. No suspicious mediastinal lymphadenopathy. Visualized thyroid is unremarkable. Lungs/Pleura: Evaluation of the lung parenchyma is constrained by respiratory motion. 3-4 mm nodule along the right major fissure (series 6/image 29), likely reflecting a subpleural lymph node. No suspicious pulmonary nodules. Mild dependent  atelectasis in the bilateral lower lobes. No focal consolidation. No pleural effusion or pneumothorax. Musculoskeletal: Degenerative changes of the thoracic spine. CT ABDOMEN PELVIS FINDINGS Motion degraded images. Hepatobiliary: 12 mm cyst in the central right hepatic lobe (series 2/ image 52). Liver is otherwise within normal limits. No suspicious/enhancing hepatic lesions. Gallbladder is unremarkable. No intrahepatic or extrahepatic ductal dilatation. Pancreas: Within normal limits. Spleen: Within normal limits. Adrenals/Urinary Tract: Adrenal glands within normal limits. Kidneys are within normal limits.  No hydronephrosis. Bladder is within normal limits. Stomach/Bowel: Stomach is within normal limits. No  evidence of bowel obstruction. Normal appendix (series 2/ image 96). Focal wall thickening/mucosal hyperenhancement involving the lower rectum (series 2/images 112-117), worrisome for rectal adenocarcinoma. Vascular/Lymphatic: Atherosclerotic calcifications of the abdominal aorta and branch vessels. No evidence of abdominal aortic aneurysm. No suspicious abdominopelvic lymphadenopathy. Specifically, no suspicious perirectal lymphadenopathy. Reproductive: Prostate is unremarkable. Other: No abdominopelvic ascites. Musculoskeletal: Degenerative changes of the lumbar spine. Grade 1 spondylolisthesis at L5-S1. IMPRESSION: Focal wall thickening involving the lower rectum, worrisome for rectal adenocarcinoma. No findings suspicious for metastatic disease. Electronically Signed   By: Julian Hy M.D.   On: 11/25/2015 16:48   Ct Abdomen Pelvis W Contrast  11/25/2015  CLINICAL DATA:  Newly diagnosed colon cancer, evaluate for metastatic disease EXAM: CT CHEST, ABDOMEN, AND PELVIS WITH CONTRAST TECHNIQUE: Multidetector CT imaging of the chest, abdomen and pelvis was performed following the standard protocol during bolus administration of intravenous contrast. CONTRAST:  164m ISOVUE-300 IOPAMIDOL (ISOVUE-300) INJECTION 61% COMPARISON:  None. FINDINGS: CT CHEST FINDINGS Mediastinum/Nodes: Heart is normal in size. No pericardial effusion. Coronary atherosclerosis in the LAD. No evidence of thoracic aortic aneurysm. No suspicious mediastinal lymphadenopathy. Visualized thyroid is unremarkable. Lungs/Pleura: Evaluation of the lung parenchyma is constrained by respiratory motion. 3-4 mm nodule along the right major fissure (series 6/image 29), likely reflecting a subpleural lymph node. No suspicious pulmonary nodules. Mild dependent atelectasis in the bilateral lower lobes. No focal consolidation. No pleural effusion or pneumothorax. Musculoskeletal: Degenerative changes of the thoracic spine. CT ABDOMEN PELVIS FINDINGS  Motion degraded images. Hepatobiliary: 12 mm cyst in the central right hepatic lobe (series 2/ image 52). Liver is otherwise within normal limits. No suspicious/enhancing hepatic lesions. Gallbladder is unremarkable. No intrahepatic or extrahepatic ductal dilatation. Pancreas: Within normal limits. Spleen: Within normal limits. Adrenals/Urinary Tract: Adrenal glands within normal limits. Kidneys are within normal limits.  No hydronephrosis. Bladder is within normal limits. Stomach/Bowel: Stomach is within normal limits. No evidence of bowel obstruction. Normal appendix (series 2/ image 96). Focal wall thickening/mucosal hyperenhancement involving the lower rectum (series 2/images 112-117), worrisome for rectal adenocarcinoma. Vascular/Lymphatic: Atherosclerotic calcifications of the abdominal aorta and branch vessels. No evidence of abdominal aortic aneurysm. No suspicious abdominopelvic lymphadenopathy. Specifically, no suspicious perirectal lymphadenopathy. Reproductive: Prostate is unremarkable. Other: No abdominopelvic ascites. Musculoskeletal: Degenerative changes of the lumbar spine. Grade 1 spondylolisthesis at L5-S1. IMPRESSION: Focal wall thickening involving the lower rectum, worrisome for rectal adenocarcinoma. No findings suspicious for metastatic disease. Electronically Signed   By: SJulian HyM.D.   On: 11/25/2015 16:48   Dg Abd Acute W/chest  11/15/2015  CLINICAL DATA:  Hematuria. EXAM: DG ABDOMEN ACUTE W/ 1V CHEST COMPARISON:  None. FINDINGS: There is no evidence of dilated bowel loops or free intraperitoneal air. Pelvic vascular calcifications noted, but no definite radiopaque calculi identified. Heart size and mediastinal contours are within normal limits. Both lungs are clear. No evidence of pneumothorax or pleural effusion. IMPRESSION: No acute findings.  No active cardiopulmonary  disease. Electronically Signed   By: Earle Gell M.D.   On: 11/15/2015 13:55   COLONOSCOPY Dr. Paulita Fujita  11/23/2015  -A front-like villous fungating polypoid sessile and ulcerated partially obstructing large mass was found in the proximal rectum. The mass was partially circumferential (involving one half of the lumen circumference). Bruising was present. This was biopsied. -Non-thrombosed external hemorrhoids -A single nonbleeding clonic angiodysplastic lesion in the hepatic flexure and descending colon  ASSESSMENT & PLAN: 65 year old male with past medical history of hypertension, presented with intermittent rectal bleeding for 3 months.   1. Proximal rectal adenocarcinoma, [id number] --I discussed his CT scan finding, colonoscopy and biopsy results in great details with patient and his family members. -I'll request an MMR to be done on his biopsy for prognosis. -He is scheduled for EUS by Dr. Paulita Fujita in 2 days for staging. From the CT scan, the tumor appears likely a T3 N0 disease. -We discussed the risk of cancer recurrence with surgery alone, and a multidisciplinary approach is needed for locally advanced disease. -If the EUS confirms a T3 or T4 lesion, or positive lymph nodes, then the standard care is neoadjuvant chemotherapy and radiation, followed by surgery and adjuvant chemotherapy. This was discussed with him in great details -I recommend oral capecitabine 8 25 mg/m twice daily with concurrent radiation. The alternative option of intravenous 5-FU continuous infusion were discussed with him also. He prefers capecitabine due to the convenience. --Chemotherapy consent: Side effects including but does not not limited to, fatigue, nausea, vomiting, diarrhea, hair loss, neuropathy, fluid retention, renal and kidney dysfunction, neutropenic fever, needed for blood transfusion, bleeding, coronary artery spasm and heart attack, were discussed with patient in great detail. He agrees to proceed. -I'll give him a call after his EUS, and arrange chemotherapy class, and Xeloda order.   Recommendations: Based  on information available as of today's consult. Recommendations may change depending on the results of further tests or exams. 1) EUS on Wednesday w/Dr. Paulita Fujita for staging 2) Chemotherapy (Xeloda) with radiation therapy M-F X 6 weeks, followed by surgery (in 6 weeks); may need chemotherapy after surgery 3)  Next Steps: 1) See Dr. Lisbeth Renshaw as scheduled today and Dr. Paulita Fujita on Wednesday 2) Labs with chemo teaching class next week  3) Bring in your disability and FMLA forms next week*   All questions were answered. The patient knows to call the clinic with any problems, questions or concerns. I spent 55 minutes counseling the patient face to face. The total time spent in the appointment was 60 minutes and more than 50% was on counseling.     Truitt Merle, MD 12/07/2015 12:01 PM   Addendum His EUS showed uT3N0 disease. I called patient's daughter-in-law [name] and discussed the result. I recommend concurrent chemoradiation, I'll send a prescription of Xeloda to [facility] today. She voiced good understanding and agreed with the plan.  I'll see patient back on the first day of concurrent chemoradiation.  Truitt Merle 12/10/2015

## 2015-12-08 ENCOUNTER — Telehealth: Payer: Self-pay | Admitting: Hematology

## 2015-12-08 ENCOUNTER — Other Ambulatory Visit: Payer: Self-pay | Admitting: *Deleted

## 2015-12-08 NOTE — Telephone Encounter (Signed)
cld & adv of pt appt on 5/22 after CT stimulatuion @12  for chemo class & lab afterwards

## 2015-12-09 ENCOUNTER — Other Ambulatory Visit: Payer: Self-pay | Admitting: Gastroenterology

## 2015-12-09 ENCOUNTER — Encounter (HOSPITAL_COMMUNITY)
Admission: RE | Disposition: A | Discharge status: Home / Self Care | Payer: Self-pay | Source: Ambulatory Visit | Attending: Gastroenterology

## 2015-12-09 ENCOUNTER — Encounter (HOSPITAL_COMMUNITY): Payer: Self-pay | Admitting: *Deleted

## 2015-12-09 ENCOUNTER — Ambulatory Visit (HOSPITAL_COMMUNITY)
Admission: RE | Admit: 2015-12-09 | Discharge: 2015-12-09 | Disposition: A | Discharge status: Home / Self Care | Payer: No Typology Code available for payment source | Source: Ambulatory Visit | Attending: Gastroenterology | Admitting: Gastroenterology

## 2015-12-09 DIAGNOSIS — F17200 Nicotine dependence, unspecified, uncomplicated: Secondary | ICD-10-CM | POA: Insufficient documentation

## 2015-12-09 DIAGNOSIS — C20 Malignant neoplasm of rectum: Secondary | ICD-10-CM | POA: Insufficient documentation

## 2015-12-09 DIAGNOSIS — K644 Residual hemorrhoidal skin tags: Secondary | ICD-10-CM | POA: Diagnosis not present

## 2015-12-09 HISTORY — PX: EUS: SHX5427

## 2015-12-09 SURGERY — ULTRASOUND, LOWER GI TRACT, ENDOSCOPIC
Anesthesia: Moderate Sedation

## 2015-12-09 MED ORDER — FENTANYL CITRATE (PF) 100 MCG/2ML IJ SOLN
INTRAMUSCULAR | Status: AC
  Filled 2015-12-09: qty 2

## 2015-12-09 MED ORDER — SODIUM CHLORIDE 0.9 % IV SOLN
INTRAVENOUS | Status: DC
  Administered 2015-12-09: 500 mL via INTRAVENOUS

## 2015-12-09 MED ORDER — FENTANYL CITRATE (PF) 100 MCG/2ML IJ SOLN
INTRAMUSCULAR | Status: DC | PRN
  Administered 2015-12-09: 25 ug via INTRAVENOUS

## 2015-12-09 MED ORDER — MIDAZOLAM HCL 5 MG/ML IJ SOLN
INTRAMUSCULAR | Status: AC
  Filled 2015-12-09: qty 2

## 2015-12-09 MED ORDER — MIDAZOLAM HCL 10 MG/2ML IJ SOLN
INTRAMUSCULAR | Status: DC | PRN
  Administered 2015-12-09: 2 mg via INTRAVENOUS

## 2015-12-09 NOTE — Op Note (Signed)
[facility] Patient Name: David Jacobs Procedure Date: 12/09/2015 MRN: [id number] Attending MD: [name] , MD Date of Birth: 1950-08-27 CSN: [id number] Age: 65 Admit Type: Outpatient Procedure:                Lower EUS Indications:              Wall thickening in rectum seen on CT scan, Rectal                            mucosal mass found on flex sig/colonoscopy                            (biopsy-proven adenocarcinoma) Providers:                [name], MD, Hilma Favors, RN, Zenon Mayo, RN, Ralene Bathe, Technician Referring MD:             Leighton Ruff, MD Medicines:                Midazolam 2 mg IV, Fentanyl 25 micrograms IV Complications:            No immediate complications. Estimated Blood Loss:     Estimated blood loss was minimal. Procedure:                Pre-Anesthesia Assessment:                           - Prior to the procedure, a History and Physical                            was performed, and patient medications and                            allergies were reviewed. The patient's tolerance of                            previous anesthesia was also reviewed. The risks                            and benefits of the procedure and the sedation                            options and risks were discussed with the patient.                            All questions were answered, and informed consent                            was obtained. Prior Anticoagulants: The patient has                            taken no previous anticoagulant or antiplatelet  agents. ASA Grade Assessment: II - A patient with                            mild systemic disease. After reviewing the risks                            and benefits, the patient was deemed in                            satisfactory condition to undergo the procedure.                           After obtaining informed consent, the endoscope was                             passed under direct vision. Throughout the                            procedure, the patient's blood pressure, pulse, and                            oxygen saturations were monitored continuously. The                            [id number] ([id number]) scope was introduced through                            the anus and advanced to the the sigmoid colon for                            ultrasound. The lower EUS was accomplished without                            difficulty. The patient tolerated the procedure                            well. The quality of the bowel preparation was                            adequate. Scope In: Scope Out: Findings:      The perianal exam findings include non-thrombosed external hemorrhoids.      Endoscopic Finding :      A fungating, infiltrative, sessile and ulcerated partially obstructing       large mass was found in the proximal rectum. The mass was partially       circumferential (involving one-third of the lumen circumference). Oozing       was present.      Endosonographic Finding :      The perirectal space was normal.      No lymph nodes were seen in the perirectal region and in a peritumoral       location.      A hypoechoic mass was found in the rectum. The mass was partially       circumferential (involving 30% of the lumen). The endosonographic  borders were poorly-defined, irregular and consistent with intraluminal       growth. There was sonographic evidence suggesting breakthrough of the       muscularis propria with invasion into the perirectal fat (Layer 5,       manifested by an irregular outer surface). There was no sonographic       evidence of invasion into the adjacent structures, urinary bladder or       prostate. Impression:               - Non-thrombosed external hemorrhoids found on                            perianal exam.                           - Malignant partially obstructing tumor in the                             proximal rectum.                           - Endosonographic images of the perirectal space                            were unremarkable.                           - No lymph nodes were seen in the perirectal region                            and in a peritumoral location during                            endosonographic examination.                           - Rectal mass was visualized endosonographically. A                            tissue diagnosis was obtained prior to this exam.                            This is of adenocarcinoma. This was staged uT3 uN0. Moderate Sedation:      Moderate (conscious) sedation was administered by the endoscopy nurse       and supervised by the endoscopist. The following parameters were       monitored: oxygen saturation, heart rate, blood pressure, and response       to care. Recommendation:           - The patient will be observed post-procedure,                            until all discharge criteria are met.                           - Discharge patient to home (via wheelchair).                           -  Resume previous diet today.                           - Return to referring physician at appointment to                            be scheduled.                           - Based on today's findings, patient would benefit                            from pre-operative neoadjuvant chemotherapy and                            radiation therapy prior to surgical resection.                           - Return to GI clinic PRN. Procedure Code(s):        --- Professional ---                           [phone], Sigmoidoscopy, flexible; with endoscopic                            ultrasound examination Diagnosis Code(s):        --- Professional ---                           C20, Malignant neoplasm of rectum                           K64.4, Residual hemorrhoidal skin tags                           K62.89, Other specified diseases of  anus and rectum                           R93.3, Abnormal findings on diagnostic imaging of                            other parts of digestive tract CPT copyright 2016 [employer]. All rights reserved. The codes documented in this report are preliminary and upon coder review may  be revised to meet current compliance requirements. [name], MD 12/09/2015 11:39:33 AM This report has been signed electronically. Number of Addenda: 0

## 2015-12-09 NOTE — H&P (Signed)
Patient interval history reviewed.  Patient examined again.  There has been no change from documented H/P dated 11/19/15 (scanned into chart from our office) except as documented above.  Assessment:  1.  Rectal cancer.  Plan:  1.  Endorectal ultrasound. 2.  Risks (bleeding, infection, bowel perforation that could require surgery, sedation-related changes in cardiopulmonary systems), benefits (identification and possible treatment of source of symptoms, exclusion of certain causes of symptoms), and alternatives (watchful waiting, radiographic imaging studies, empiric medical treatment) of endorectal ultrasound (rectal ultrasound = RUS) were explained to patient/family in detail and patient wishes to proceed.

## 2015-12-09 NOTE — Discharge Instructions (Signed)
Endorectal ultrasound (Rectal Ultrasound)  Post procedure instructions:  Read the instructions outlined below and refer to this sheet in the next few weeks. These discharge instructions provide you with general information on caring for yourself after you leave the hospital. Your doctor may also give you specific instructions. While your treatment has been planned according to the most current medical practices available, unavoidable complications occasionally occur. If you have any problems or questions after discharge, call Dr. Paulita Fujita at [facility] [phone]).  HOME CARE INSTRUCTIONS  ACTIVITY:  You may resume your regular activity, but move at a slower pace for the next 24 hours.   Take frequent rest periods for the next 24 hours.   Walking will help get rid of the air and reduce the bloated feeling in your belly (abdomen).   No driving for 24 hours (because of the medicine (anesthesia) used during the test).   You may shower.   Do not sign any important legal documents or operate any machinery for 24 hours (because of the anesthesia used during the test).  NUTRITION:  Drink plenty of fluids.   You may resume your normal diet as instructed by your doctor.   Begin with a light meal and progress to your normal diet. Heavy or fried foods are harder to digest and may make you feel sick to your stomach (nauseated).   Avoid alcoholic beverages for 24 hours or as instructed.  MEDICATIONS:  You may resume your normal medications unless your doctor tells you otherwise.  WHAT TO EXPECT TODAY:  Some feelings of bloating in the abdomen.   Passage of more gas than usual.   Spotting of blood in your stool or on the toilet paper.  IF YOU HAD POLYPS REMOVED DURING THE COLONOSCOPY:  No aspirin products for 7 days or as instructed.   No alcohol for 7 days or as instructed.   Eat a soft diet for the next 24 hours.   FINDING OUT THE RESULTS OF YOUR TEST  Not all test  results are available during your visit. If your test results are not back during the visit, make an appointment with your caregiver to find out the results. Do not assume everything is normal if you have not heard from your caregiver or the medical facility. It is important for you to follow up on all of your test results.     SEEK IMMEDIATE MEDICAL CARE IF:   You have more than a spotting of blood in your stool.   Your belly is swollen (abdominal distention).   You are nauseated or vomiting.   You have a fever.   You have abdominal pain or discomfort that is severe or gets worse throughout the day.    Document Released: 02/23/2004 Document Revised: 03/23/2011 Document Reviewed: 02/21/2008 [facility] Patient Information 2012 [place].

## 2015-12-10 ENCOUNTER — Other Ambulatory Visit: Payer: Self-pay | Admitting: Pharmacist

## 2015-12-10 ENCOUNTER — Other Ambulatory Visit: Payer: Self-pay | Admitting: *Deleted

## 2015-12-10 ENCOUNTER — Encounter: Payer: Self-pay | Admitting: Pharmacist

## 2015-12-10 ENCOUNTER — Encounter (HOSPITAL_COMMUNITY): Payer: Self-pay | Admitting: Gastroenterology

## 2015-12-10 DIAGNOSIS — C20 Malignant neoplasm of rectum: Secondary | ICD-10-CM

## 2015-12-10 MED ORDER — CAPECITABINE 500 MG PO TABS: ORAL_TABLET | ORAL | Status: DC

## 2015-12-10 MED ORDER — CAPECITABINE 500 MG PO TABS: 825.0000 mg/m2 | ORAL_TABLET | Freq: Two times a day (BID) | ORAL | Status: DC

## 2015-12-10 NOTE — Progress Notes (Signed)
12/10/15 E-scribed new Xeloda prescription to [facility], per insurance cannot be filled with them, prescription then e-scribed to [facility], also faxed copy of latest office note and insurance card to [facility]. Johny Drilling, PharmD

## 2015-12-11 ENCOUNTER — Encounter: Payer: Self-pay | Admitting: Radiation Oncology

## 2015-12-11 ENCOUNTER — Other Ambulatory Visit: Payer: Self-pay | Admitting: Hematology

## 2015-12-11 NOTE — Addendum Note (Signed)
Encounter addended by: Kyung Rudd, MD on: 12/11/2015 10:09 PM<BR>     Documentation filed: Problem List

## 2015-12-12 ENCOUNTER — Encounter: Payer: Self-pay | Admitting: Hematology

## 2015-12-14 ENCOUNTER — Other Ambulatory Visit: Payer: Self-pay | Admitting: Hematology

## 2015-12-14 ENCOUNTER — Ambulatory Visit
Admission: RE | Admit: 2015-12-14 | Discharge: 2015-12-14 | Disposition: A | Discharge status: Home / Self Care | Payer: No Typology Code available for payment source | Source: Ambulatory Visit | Attending: Radiation Oncology | Admitting: Radiation Oncology

## 2015-12-14 ENCOUNTER — Other Ambulatory Visit (HOSPITAL_BASED_OUTPATIENT_CLINIC_OR_DEPARTMENT_OTHER): Payer: PRIVATE HEALTH INSURANCE

## 2015-12-14 ENCOUNTER — Other Ambulatory Visit: Payer: PRIVATE HEALTH INSURANCE

## 2015-12-14 ENCOUNTER — Encounter: Payer: Self-pay | Admitting: Pharmacist

## 2015-12-14 ENCOUNTER — Other Ambulatory Visit: Payer: Self-pay

## 2015-12-14 DIAGNOSIS — C20 Malignant neoplasm of rectum: Secondary | ICD-10-CM

## 2015-12-14 DIAGNOSIS — Z510 Encounter for antineoplastic radiation therapy: Secondary | ICD-10-CM | POA: Diagnosis not present

## 2015-12-14 LAB — COMPREHENSIVE METABOLIC PANEL
ALT: 18 U/L (ref 0–55)
ANION GAP: 9 meq/L (ref 3–11)
AST: 13 U/L (ref 5–34)
Albumin: 4.1 g/dL (ref 3.5–5.0)
Alkaline Phosphatase: 57 U/L (ref 40–150)
BUN: 19.5 mg/dL (ref 7.0–26.0)
CHLORIDE: 104 meq/L (ref 98–109)
CO2: 26 meq/L (ref 22–29)
Calcium: 9.9 mg/dL (ref 8.4–10.4)
Creatinine: 1.1 mg/dL (ref 0.7–1.3)
EGFR: 73 mL/min/{1.73_m2} — AB (ref >90)
Glucose: 100 mg/dl (ref 70–140)
POTASSIUM: 4.1 meq/L (ref 3.5–5.1)
Sodium: 139 mEq/L (ref 136–145)
Total Bilirubin: 0.37 mg/dL (ref 0.20–1.20)
Total Protein: 7.2 g/dL (ref 6.4–8.3)

## 2015-12-14 LAB — CBC WITH DIFFERENTIAL/PLATELET
BASO%: 0.9 % (ref 0.0–2.0)
Basophils Absolute: 0.1 10*3/uL (ref 0.0–0.1)
EOS ABS: 0.1 10*3/uL (ref 0.0–0.5)
EOS%: 0.7 % (ref 0.0–7.0)
HCT: 45.6 % (ref 38.4–49.9)
HGB: 15.5 g/dL (ref 13.0–17.1)
LYMPH#: 1.5 10*3/uL (ref 0.9–3.3)
LYMPH%: 16.6 % (ref 14.0–49.0)
MCH: 30.8 pg (ref 27.2–33.4)
MCHC: 33.9 g/dL (ref 32.0–36.0)
MCV: 91 fL (ref 79.3–98.0)
MONO#: 0.6 10*3/uL (ref 0.1–0.9)
MONO%: 6.9 % (ref 0.0–14.0)
NEUT#: 6.9 10*3/uL — ABNORMAL HIGH (ref 1.5–6.5)
NEUT%: 74.9 % (ref 39.0–75.0)
PLATELETS: 283 10*3/uL (ref 140–400)
RBC: 5.01 10*6/uL (ref 4.20–5.82)
RDW: 13 % (ref 11.0–14.6)
WBC: 9.2 10*3/uL (ref 4.0–10.3)

## 2015-12-14 LAB — IRON AND TIBC
%SAT: 16 % — ABNORMAL LOW (ref 20–55)
Iron: 42 ug/dL (ref 42–163)
TIBC: 255 ug/dL (ref 202–409)
UIBC: 213 ug/dL (ref 117–376)

## 2015-12-14 LAB — FERRITIN: Ferritin: 208 ng/ml (ref 22–316)

## 2015-12-14 NOTE — Progress Notes (Signed)
Oral Chemotherapy Pharmacist Encounter         Pt does not speak English.    I spoke with patient, his dtr [name] and an interpreter for overview of new oral chemotherapy medication: Xeloda. The prescription was sent to [facility] and pt has $10 copay. They are shipping supply of Xeloda (105 tabs w/ 1 RF) to pts home.  Pt will call our office when drug arrives.  Xeloda dose is 500 mg tabs - take 4 tabs in AM and 3 tab in PM on XRT days w/ food x 6 weeks.  Expected start date is 12/22/15, his 1st day of XRT.  Reinforced counseling provided initially by chemo ed nurse, Royston Sinner, RN.  She had educated patient on administration, dosing, side effects, safe handling, and monitoring. Side effects include but not limited to: diarrhea, fatigue, hand/foot, n/v.  Per Royston Sinner, RN, pt has been educated on specific skin protocol by XRT staff. Pt and dtr voiced understanding and appreciation.   All questions answered.  Will follow up in 1-2 weeks for adherence and toxicity management. Will need to contact pts dtr [name] [phone] through Jun 10. She will be out of the country for 1 month. During that time, we can contact son-in-law Vilinda Flake; (pronounced E-gor) [phone]. I provided pt w/ our contact information for Oral Chemo Clinic at [facility].  Thank you, Kennith Center, Pharm.D., CPP 12/14/2015@1 :31 PM Oral Chemotherapy Clinic

## 2015-12-15 LAB — CEA: CEA: 2.2 ng/mL (ref 0.0–4.7)

## 2015-12-15 NOTE — Progress Notes (Signed)
  Radiation Oncology         [phone] ________________________________  Name: David Jacobs MRN: [id number]  Date: 12/14/2015  DOB: 1951/02/12   SIMULATION AND TREATMENT PLANNING NOTE  DIAGNOSIS:     ICD-9-CM ICD-10-CM   1. Rectal cancer ([place]) 154.1 C20      The patient presented for simulation for the patient's upcoming course of radiation for the diagnosis of rectal cancer. The patient was placed in a supine position. A customized vac-lock bag was constructed to aid in patient immobilization on. This complex treatment device will be used on a daily basis during the treatment. In this fashion a CT scan was obtained through the pelvic region and the isocenter was placed near midline within the pelvis. Surface markings were placed.  The patient's imaging was loaded into the radiation treatment planning system. The patient will initially be planned to receive a course of radiation to a dose of 45 Gy. This will be accomplished in 25 fractions at 1.8 gray per fraction. This initial treatment will correspond to a 3-D conformal technique. The target has been contoured in addition to the rectum, bladder and femoral heads. Dose volume histograms of each of these structures have been requested and these will be carefully reviewed as part of the 3-D conformal treatment planning process. To accomplish this initial treatment, 4 customized blocks have been designed for this purpose. Each of these 4 complex treatment devices will be used on a daily basis during the initial course of the treatment. It is anticipated that the patient will then receive a boost for an additional 5.4 Gy. The anticipated total dose therefore will be 50.4 Gy.    Special treatment procedure The patient will receive chemotherapy during the course of radiation treatment. The patient may experience increased or overlapping toxicity due to this combined-modality approach and the patient will be monitored for such problems. This may  include extra lab work as necessary. This therefore constitutes a special treatment procedure.    ________________________________  Jodelle Gross, MD, PhD

## 2015-12-15 NOTE — Progress Notes (Signed)
  Radiation Oncology         [phone] ________________________________  Name: David Jacobs MRN: [id number]  Date: 12/14/2015  DOB: 12-Nov-1950  Optical Surface Tracking Plan:  Since intensity modulated radiotherapy (IMRT) and 3D conformal radiation treatment methods are predicated on accurate and precise positioning for treatment, intrafraction motion monitoring is medically necessary to ensure accurate and safe treatment delivery.  The ability to quantify intrafraction motion without excessive ionizing radiation dose can only be performed with optical surface tracking. Accordingly, surface imaging offers the opportunity to obtain 3D measurements of patient position throughout IMRT and 3D treatments without excessive radiation exposure.  I am ordering optical surface tracking for this patient's upcoming course of radiotherapy. ________________________________  Kyung Rudd, MD 12/15/2015 10:55 AM    Reference:   Ursula Alert, J, et al. Surface imaging-based analysis of intrafraction motion for breast radiotherapy patients.Journal of [place], n. 6, nov. 2014. ISSN [id number].   Available at: <http://www.jacmp.org/index.php/jacmp/article/view/4957>.

## 2015-12-16 ENCOUNTER — Encounter: Payer: Self-pay | Admitting: Hematology

## 2015-12-16 ENCOUNTER — Telehealth: Payer: Self-pay | Admitting: *Deleted

## 2015-12-16 DIAGNOSIS — Z510 Encounter for antineoplastic radiation therapy: Secondary | ICD-10-CM | POA: Diagnosis not present

## 2015-12-16 NOTE — Telephone Encounter (Signed)
Daughter-n-law Erasmo Leventhal called inquiring about "FMLA form left last week.  Today I received call from [facility] about a [place] for him.  [place] instructed me to notify your office to expect a call from [place] because they need records for his disability claim.  I'll call back tomorrow due to the three to five business day policy."

## 2015-12-16 NOTE — Progress Notes (Signed)
forms left in box 12/14/15 °

## 2015-12-17 ENCOUNTER — Encounter: Payer: Self-pay | Admitting: Hematology

## 2015-12-17 NOTE — Progress Notes (Signed)
forms are ready- [name] will pick up and she said to fax [phone]-copy sent to medical records also

## 2015-12-17 NOTE — Progress Notes (Signed)
forms left in box 12/14/15- left forms for patient/wife for dr Burr Medico to sign

## 2015-12-22 ENCOUNTER — Ambulatory Visit
Admission: RE | Admit: 2015-12-22 | Discharge: 2015-12-22 | Disposition: A | Discharge status: Home / Self Care | Payer: No Typology Code available for payment source | Source: Ambulatory Visit | Attending: Radiation Oncology | Admitting: Radiation Oncology

## 2015-12-22 ENCOUNTER — Ambulatory Visit (HOSPITAL_BASED_OUTPATIENT_CLINIC_OR_DEPARTMENT_OTHER): Payer: PRIVATE HEALTH INSURANCE | Admitting: Hematology

## 2015-12-22 ENCOUNTER — Encounter: Payer: Self-pay | Admitting: Hematology

## 2015-12-22 ENCOUNTER — Telehealth: Payer: Self-pay | Admitting: Hematology

## 2015-12-22 VITALS — BP 146/63 | HR 78 | Temp 98.2°F | Resp 18 | Ht 71.0 in | Wt 197.7 lb

## 2015-12-22 DIAGNOSIS — Z720 Tobacco use: Secondary | ICD-10-CM

## 2015-12-22 DIAGNOSIS — I10 Essential (primary) hypertension: Secondary | ICD-10-CM

## 2015-12-22 DIAGNOSIS — C20 Malignant neoplasm of rectum: Secondary | ICD-10-CM | POA: Diagnosis not present

## 2015-12-22 DIAGNOSIS — Z510 Encounter for antineoplastic radiation therapy: Secondary | ICD-10-CM | POA: Diagnosis not present

## 2015-12-22 MED ORDER — ONDANSETRON HCL 8 MG PO TABS: 8.0000 mg | ORAL_TABLET | Freq: Three times a day (TID) | ORAL | Status: DC | PRN

## 2015-12-22 NOTE — Telephone Encounter (Signed)
Gave and printed appt sched and avs fo rpt for May thru July  °

## 2015-12-22 NOTE — Progress Notes (Signed)
[name]  [phone] [phone]  Clinic Follow Up Note   Patient Care Team: No Pcp Per Patient as PCP - General (General Practice) 12/22/2015   CHIEF COMPLAINTS:  Follow Up rectal cancer  Oncology History   Rectal cancer [facility])   Staging form: Colon and Rectum, AJCC 7th Edition     Clinical stage from 12/09/2015: Stage IIA (T3, N0, M0) - Signed by Truitt Merle, MD on 12/22/2015       Rectal cancer ([place])   11/23/2015 Initial Diagnosis Rectal cancer ([place])   11/23/2015 Procedure Colonoscopy showed a large fungating ulcerated partially obstructing mass in the proximal rectum. This was biopsied.   11/23/2015 Initial Biopsy Rectal mass biopsy showed adenocarcinoma   11/25/2015 Imaging CT chest, abdomen and pelvis with contrast showed focal wall thickening involving the low rectum, no findings suspicious for metastatic disease.   12/09/2015 Procedure EUS showed a T3 N0 lesion in proximal rectum.   12/22/2015 -  Radiation Therapy New adjuvant irradiation to the rectal cancer   12/22/2015 -  Chemotherapy Neoadjuvant Xeloda 2000mg  in AM, and 1500mg  in PM with concurrent radiation     HISTORY OF PRESENTING ILLNESS (12/07/2015):  David Jacobs 65 y.o. male is here because of His recently diagnosed rectal cancer. He is accompanied by an interpreter and daughter-in-law [name],  to the clinic today.  He has been having rectal bleeding for 3 months, stable overall, small amount, usually once a day, no pain, he otherwise feels well, no fatigue or weight change lately. He was referred by his primary care physician to gastroenterologist Dr. Paulita Fujita, and underwent colonoscopy on 11/23/2015. It showed a fungating ulcerated partially obstructing mass in the proximal rectum, They showed adenocarcinoma. CT scan revealed no evidence of distant metastasis.He was referred to [place] for further management.  He works for the [employer]still works full-time. He livers with his wife, one  of his son and his family. He never had a colonoscopy before he reason one. No family history of colon cancer.    CURRENT THERAPY:  Concurrent radiation and chemotherapy with  Xeloa  200 m in the morning and 1500 mg in the evening  INTERIM HISTORY: Mr. Toelle returns for follow-up. He is starting radiation and chemotherapy today. He feels well overall,mild rectal bleeding stable, no other new complaints.He denies significant pain, abdominal discomfort,or other symptoms.  MEDICAL HISTORY:  Past Medical History  Diagnosis Date  . Hypertension     SURGICAL HISTORY: Past Surgical History  Procedure Laterality Date  . Eus N/A 12/09/2015    Procedure: LOWER ENDOSCOPIC ULTRASOUND (EUS);  Surgeon: [name], MD;  Location: [facility];  Service: Endoscopy;  Laterality: N/A;    SOCIAL HISTORY: Social History   Social History  . Marital Status: Married    Spouse Name: N/A  . Number of Children: 2  . Years of Education: N/A   Occupational History  . Not on file.   Social History Main Topics  . Smoking status: Current Every Day Smoker -- 0.50 packs/day for 35 years  . Smokeless tobacco: Not on file  . Alcohol Use: 1.2 - 1.8 oz/week    2-3 Cans of beer, 0 Standard drinks or equivalent per week     Comment: a few beers a week   . Drug Use: Not on file  . Sexual Activity: Not on file   Other Topics Concern  . Not on file   Social History Narrative   Married, lives w/his wife and a son--minimal  English   Works full time at [employer]   Current everyday smoker, with no plans to quit   Primary contact is daughter-in-law, [name] (can interpret for him)    FAMILY HISTORY: Family History  Problem Relation Age of Onset  . Cancer Mother 66    throid cancer   . Hyperlipidemia Mother   . Diabetes Father     ALLERGIES:  has No Known Allergies.  MEDICATIONS:  Current Outpatient Prescriptions  Medication Sig Dispense Refill  . amLODipine (NORVASC) 5 MG tablet  Take 1 tablet (5 mg total) by mouth daily. 30 tablet 1  . capecitabine (XELODA) 500 MG tablet Take 4 tabs (2000mg ) by mouth in the morning & 3 tabs (1500mg ) by mouth in the evening on the days of radiation only. Take with food. 105 tablet 1  . losartan-hydrochlorothiazide (HYZAAR) 100-25 MG tablet Take 1 tablet by mouth daily.    . hydrocortisone cream 1 % Apply 1 application topically 2 (two) times daily. (Patient not taking: Reported on 12/22/2015) 30 g 0  . ondansetron (ZOFRAN) 8 MG tablet Take 1 tablet (8 mg total) by mouth every 8 (eight) hours as needed for nausea. 30 tablet 2   No current facility-administered medications for this visit.    REVIEW OF SYSTEMS:   Constitutional: Denies fevers, chills or abnormal night sweats Eyes: Denies blurriness of vision, double vision or watery eyes Ears, nose, mouth, throat, and face: Denies mucositis or sore throat Respiratory: Denies cough, dyspnea or wheezes Cardiovascular: Denies palpitation, chest discomfort or lower extremity swelling Gastrointestinal:  Denies nausea, heartburn or change in bowel habits Skin: Denies abnormal skin rashes Lymphatics: Denies new lymphadenopathy or easy bruising Neurological:Denies numbness, tingling or new weaknesses Behavioral/Psych: Mood is stable, no new changes  All other systems were reviewed with the patient and are negative.  PHYSICAL EXAMINATION: ECOG PERFORMANCE STATUS: 0 - Asymptomatic  Filed Vitals:   12/22/15 1427  BP: 146/63  Pulse: 78  Temp: 98.2 F (36.8 C)  Resp: 18   Filed Weights   12/22/15 1427  Weight: 197 lb 11.2 oz (89.676 kg)    GENERAL:alert, no distress and comfortable SKIN: skin color, texture, turgor are normal, no rashes or significant lesions EYES: normal, conjunctiva are pink and non-injected, sclera clear OROPHARYNX:no exudate, no erythema and lips, buccal mucosa, and tongue normal  NECK: supple, thyroid normal size, non-tender, without nodularity LYMPH:  no  palpable lymphadenopathy in the cervical, axillary or inguinal LUNGS: clear to auscultation and percussion with normal breathing effort HEART: regular rate & rhythm and no murmurs and no lower extremity edema ABDOMEN:abdomen soft, non-tender and normal bowel sounds, no organomegaly, rectal exam was deferred due to his multiple exam lately.  Musculoskeletal:no cyanosis of digits and no clubbing  PSYCH: alert & oriented x 3 with fluent speech NEURO: no focal motor/sensory deficits  LABORATORY DATA:  I have reviewed the data as listed CBC Latest Ref Rng 12/14/2015 11/15/2015 04/20/2015  WBC 4.0 - 10.3 10e3/uL 9.2 11.2(A) 12.1(H)  Hemoglobin 13.0 - 17.1 g/dL 15.5 16.4 -  Hematocrit 38.4 - 49.9 % 45.6 45.7 49.9  Platelets 140 - 400 10e3/uL 283 - 380(H)    CMP Latest Ref Rng 12/14/2015 11/15/2015 04/20/2015  Glucose 70 - 140 mg/dl 100 116(H) 114(H)  BUN 7.0 - 26.0 mg/dL 19.5 15 17   Creatinine 0.7 - 1.3 mg/dL 1.1 1.16 1.29(H)  Sodium 136 - 145 mEq/L 139 138 140  Potassium 3.5 - 5.1 mEq/L 4.1 4.5 5.1  Chloride 98 -  110 mmol/L - 102 97  CO2 22 - 29 mEq/L 26 27 -  Calcium 8.4 - 10.4 mg/dL 9.9 9.7 10.0  Total Protein 6.4 - 8.3 g/dL 7.2 - 7.1  Total Bilirubin 0.20 - 1.20 mg/dL 0.37 - 0.5  Alkaline Phos 40 - 150 U/L 57 - 62  AST 5 - 34 U/L 13 - 13  ALT 0 - 55 U/L 18 - 21     PATHOLOGY REPORT: Rectum biopsy 11/23/2015 -Invasive adenocarcinoma  Microscopic comment Interval Departmental review obtained (Dr. Lyndon Code) with agreement.  RADIOGRAPHIC STUDIES: I have personally reviewed the radiological images as listed and agreed with the findings in the report. Ct Chest W Contrast  11/25/2015  CLINICAL DATA:  Newly diagnosed colon cancer, evaluate for metastatic disease EXAM: CT CHEST, ABDOMEN, AND PELVIS WITH CONTRAST TECHNIQUE: Multidetector CT imaging of the chest, abdomen and pelvis was performed following the standard protocol during bolus administration of intravenous contrast. CONTRAST:  113mL  ISOVUE-300 IOPAMIDOL (ISOVUE-300) INJECTION 61% COMPARISON:  None. FINDINGS: CT CHEST FINDINGS Mediastinum/Nodes: Heart is normal in size. No pericardial effusion. Coronary atherosclerosis in the LAD. No evidence of thoracic aortic aneurysm. No suspicious mediastinal lymphadenopathy. Visualized thyroid is unremarkable. Lungs/Pleura: Evaluation of the lung parenchyma is constrained by respiratory motion. 3-4 mm nodule along the right major fissure (series 6/image 29), likely reflecting a subpleural lymph node. No suspicious pulmonary nodules. Mild dependent atelectasis in the bilateral lower lobes. No focal consolidation. No pleural effusion or pneumothorax. Musculoskeletal: Degenerative changes of the thoracic spine. CT ABDOMEN PELVIS FINDINGS Motion degraded images. Hepatobiliary: 12 mm cyst in the central right hepatic lobe (series 2/ image 52). Liver is otherwise within normal limits. No suspicious/enhancing hepatic lesions. Gallbladder is unremarkable. No intrahepatic or extrahepatic ductal dilatation. Pancreas: Within normal limits. Spleen: Within normal limits. Adrenals/Urinary Tract: Adrenal glands within normal limits. Kidneys are within normal limits.  No hydronephrosis. Bladder is within normal limits. Stomach/Bowel: Stomach is within normal limits. No evidence of bowel obstruction. Normal appendix (series 2/ image 96). Focal wall thickening/mucosal hyperenhancement involving the lower rectum (series 2/images 112-117), worrisome for rectal adenocarcinoma. Vascular/Lymphatic: Atherosclerotic calcifications of the abdominal aorta and branch vessels. No evidence of abdominal aortic aneurysm. No suspicious abdominopelvic lymphadenopathy. Specifically, no suspicious perirectal lymphadenopathy. Reproductive: Prostate is unremarkable. Other: No abdominopelvic ascites. Musculoskeletal: Degenerative changes of the lumbar spine. Grade 1 spondylolisthesis at L5-S1. IMPRESSION: Focal wall thickening involving the  lower rectum, worrisome for rectal adenocarcinoma. No findings suspicious for metastatic disease. Electronically Signed   By: Julian Hy M.D.   On: 11/25/2015 16:48   Ct Abdomen Pelvis W Contrast  11/25/2015  CLINICAL DATA:  Newly diagnosed colon cancer, evaluate for metastatic disease EXAM: CT CHEST, ABDOMEN, AND PELVIS WITH CONTRAST TECHNIQUE: Multidetector CT imaging of the chest, abdomen and pelvis was performed following the standard protocol during bolus administration of intravenous contrast. CONTRAST:  120mL ISOVUE-300 IOPAMIDOL (ISOVUE-300) INJECTION 61% COMPARISON:  None. FINDINGS: CT CHEST FINDINGS Mediastinum/Nodes: Heart is normal in size. No pericardial effusion. Coronary atherosclerosis in the LAD. No evidence of thoracic aortic aneurysm. No suspicious mediastinal lymphadenopathy. Visualized thyroid is unremarkable. Lungs/Pleura: Evaluation of the lung parenchyma is constrained by respiratory motion. 3-4 mm nodule along the right major fissure (series 6/image 29), likely reflecting a subpleural lymph node. No suspicious pulmonary nodules. Mild dependent atelectasis in the bilateral lower lobes. No focal consolidation. No pleural effusion or pneumothorax. Musculoskeletal: Degenerative changes of the thoracic spine. CT ABDOMEN PELVIS FINDINGS Motion degraded images. Hepatobiliary: 12 mm cyst in the  central right hepatic lobe (series 2/ image 52). Liver is otherwise within normal limits. No suspicious/enhancing hepatic lesions. Gallbladder is unremarkable. No intrahepatic or extrahepatic ductal dilatation. Pancreas: Within normal limits. Spleen: Within normal limits. Adrenals/Urinary Tract: Adrenal glands within normal limits. Kidneys are within normal limits.  No hydronephrosis. Bladder is within normal limits. Stomach/Bowel: Stomach is within normal limits. No evidence of bowel obstruction. Normal appendix (series 2/ image 96). Focal wall thickening/mucosal hyperenhancement involving the lower  rectum (series 2/images 112-117), worrisome for rectal adenocarcinoma. Vascular/Lymphatic: Atherosclerotic calcifications of the abdominal aorta and branch vessels. No evidence of abdominal aortic aneurysm. No suspicious abdominopelvic lymphadenopathy. Specifically, no suspicious perirectal lymphadenopathy. Reproductive: Prostate is unremarkable. Other: No abdominopelvic ascites. Musculoskeletal: Degenerative changes of the lumbar spine. Grade 1 spondylolisthesis at L5-S1. IMPRESSION: Focal wall thickening involving the lower rectum, worrisome for rectal adenocarcinoma. No findings suspicious for metastatic disease. Electronically Signed   By: Julian Hy M.D.   On: 11/25/2015 16:48   COLONOSCOPY Dr. Paulita Fujita 11/23/2015  -A front-like villous fungating polypoid sessile and ulcerated partially obstructing large mass was found in the proximal rectum. The mass was partially circumferential (involving one half of the lumen circumference). Bruising was present. This was biopsied. -Non-thrombosed external hemorrhoids -A single nonbleeding clonic angiodysplastic lesion in the hepatic flexure and descending colon  EUS 12/09/2015 - Non-thrombosed external hemorrhoids found on perianal exam. - Malignant partially obstructing tumor in the proximal rectum. - Endosonographic images of the perirectal space were unremarkable. - No lymph nodes were seen in the perirectal region and in a peritumoral location during endosonographic examination. - Rectal mass was visualized endosonographically. A tissue diagnosis was obtained prior to this exam. This is of adenocarcinoma. This was staged uT3 uN0.   ASSESSMENT & PLAN: 65 year old male with past medical history of hypertension, presented with intermittent rectal bleeding for 3 months.   1. Proximal rectal adenocarcinoma, uT3N0M0, stage IIA --I discussed his CT scan finding, colonoscopy and biopsy results in great details with patient and his family members. -his  EUS showed a T3 N0 disease -We discussed the risk of cancer recurrence with surgery alone, and multidisciplinary approach is needed for locally advanced disease. -is starting concurrent chemotherapy and irradiation, with Xeloda 2000 mg in the a.m., 1500 mg in the evening today. -We reviewed the potential side effects from chemotherapy and radiation, especially nausea, rectal pain and diarrhea. Neutropenia fever percussion were reviewed with patient again. -I called in Zofran to his pharmacy today.He has Imodium at home and will use as needed for diarrhea. -We'll monitor his CBC and CMP weekly, who will see him every other week if he is doing well, or more frequently if needed. -I reviewed his lab results from his previous visit, no significant anemia or iron deficiency. His CEA was normal.  2. HTN -He is losartan-HCTZ, we discussed potential impact of chemotherapy radiation on his blood pressure, and we may need hold hydrochlorothiazide as needed if his blood pressure goes low.  3. Smoking and alcohol cessation -He smokes half pack a day,I strongly encouraged him to cut back and quit -I also recommend him not to drink any alcohol when he is on chemotherapy and radiation. He agrees  Plan  -Continue Xeloda daily Monday through Friday hen he is on radiation -Lab CBC and CMP every Tuesday for the next 5 weeks -I called in zofran for him  -He will see a APP in 2 weeks and me in 4 weeks.  All questions were answered. The patient knows to call  the clinic with any problems, questions or concerns. I spent 20 minutes counseling the patient face to face. The total time spent in the appointment was 25 minutes and more than 50% was on counseling.     Truitt Merle, MD 12/22/2015 3:43 PM

## 2015-12-23 ENCOUNTER — Ambulatory Visit
Admission: RE | Admit: 2015-12-23 | Discharge: 2015-12-23 | Disposition: A | Discharge status: Home / Self Care | Payer: No Typology Code available for payment source | Source: Ambulatory Visit | Attending: Radiation Oncology | Admitting: Radiation Oncology

## 2015-12-23 DIAGNOSIS — Z510 Encounter for antineoplastic radiation therapy: Secondary | ICD-10-CM | POA: Diagnosis not present

## 2015-12-24 ENCOUNTER — Ambulatory Visit
Admission: RE | Admit: 2015-12-24 | Discharge: 2015-12-24 | Disposition: A | Discharge status: Home / Self Care | Payer: No Typology Code available for payment source | Source: Ambulatory Visit | Attending: Radiation Oncology | Admitting: Radiation Oncology

## 2015-12-24 ENCOUNTER — Ambulatory Visit: Payer: No Typology Code available for payment source | Admitting: Radiation Oncology

## 2015-12-24 DIAGNOSIS — Z510 Encounter for antineoplastic radiation therapy: Secondary | ICD-10-CM | POA: Diagnosis not present

## 2015-12-25 ENCOUNTER — Ambulatory Visit
Admission: RE | Admit: 2015-12-25 | Discharge: 2015-12-25 | Disposition: A | Discharge status: Home / Self Care | Payer: PRIVATE HEALTH INSURANCE | Source: Ambulatory Visit | Attending: Radiation Oncology | Admitting: Radiation Oncology

## 2015-12-25 ENCOUNTER — Ambulatory Visit
Admission: RE | Admit: 2015-12-25 | Discharge: 2015-12-25 | Disposition: A | Discharge status: Home / Self Care | Payer: No Typology Code available for payment source | Source: Ambulatory Visit | Attending: Radiation Oncology | Admitting: Radiation Oncology

## 2015-12-25 ENCOUNTER — Encounter: Payer: Self-pay | Admitting: Radiation Oncology

## 2015-12-25 VITALS — BP 121/69 | HR 72 | Temp 97.2°F | Resp 16 | Wt 192.3 lb

## 2015-12-25 DIAGNOSIS — Z510 Encounter for antineoplastic radiation therapy: Secondary | ICD-10-CM | POA: Diagnosis not present

## 2015-12-25 DIAGNOSIS — C20 Malignant neoplasm of rectum: Secondary | ICD-10-CM

## 2015-12-25 NOTE — Progress Notes (Addendum)
Pt education with interpreter sanja grigic, and son,  Patient education done, radiation therapy and you book,my business card,  discussed ways to manage side effects,  Urinary bladder changes, sitz bath prn;  Fatigue, skin irritation, n,v,d, low fiber diet if diarrhea,imodium prn, may need to eat 5-6 smaller meals and snacks, in between, baby wipes, sitz bath to use prn ,  Increase calories,protein in diet,drink plenty water stay hydrated,  No pain today, son can read english and will go over again with the patient at home with the marked pages in the rad book, verbal understanding, will review each week 3:00 PM Wt Readings from Last 3 Encounters:  12/22/15 197 lb 11.2 oz (89.676 kg)  12/09/15 197 lb (89.359 kg)  12/07/15 197 lb 6.4 oz (89.54 kg)  BP 121/69 mmHg  Pulse 72  Temp(Src) 97.2 F (36.2 C) (Oral)  Resp 16  Wt 192 lb 4.8 oz (87.227 kg)

## 2015-12-27 NOTE — Progress Notes (Signed)
   Department of Radiation Oncology  Phone:  [phone] Fax:        [phone]  Weekly Treatment Note    Name: David Jacobs Date: 12/27/2015 MRN: [id number] DOB: 24-Oct-1950   Diagnosis:     ICD-9-CM ICD-10-CM   1. Rectal cancer ([facility]) 154.1 C20      Current dose: 7.2 Gy  Current fraction: 4   MEDICATIONS: Current Outpatient Prescriptions  Medication Sig Dispense Refill  . capecitabine (XELODA) 500 MG tablet Take 4 tabs (2000mg ) by mouth in the morning & 3 tabs (1500mg ) by mouth in the evening on the days of radiation only. Take with food. 105 tablet 1  . losartan-hydrochlorothiazide (HYZAAR) 100-25 MG tablet Take 1 tablet by mouth daily.    . ondansetron (ZOFRAN) 8 MG tablet Take 1 tablet (8 mg total) by mouth every 8 (eight) hours as needed for nausea. 30 tablet 2  . amLODipine (NORVASC) 5 MG tablet Take 1 tablet (5 mg total) by mouth daily. (Patient not taking: Reported on 12/25/2015) 30 tablet 1  . hydrocortisone cream 1 % Apply 1 application topically 2 (two) times daily. (Patient not taking: Reported on 12/22/2015) 30 g 0   No current facility-administered medications for this encounter.     ALLERGIES: Review of patient's allergies indicates no known allergies.   LABORATORY DATA:  Lab Results  Component Value Date   WBC 9.2 12/14/2015   HGB 15.5 12/14/2015   HCT 45.6 12/14/2015   MCV 91.0 12/14/2015   PLT 283 12/14/2015   Lab Results  Component Value Date   NA 139 12/14/2015   K 4.1 12/14/2015   CL 102 11/15/2015   CO2 26 12/14/2015   Lab Results  Component Value Date   ALT 18 12/14/2015   AST 13 12/14/2015   GGT 39 04/20/2015   ALKPHOS 57 12/14/2015   BILITOT 0.37 12/14/2015     NARRATIVE: David Jacobs was seen today for weekly treatment management. The chart was checked and the patient's films were reviewed.  Pt education with interpreter sanja grigic, and son,  Patient education done, radiation therapy and you book,my business card,  discussed  ways to manage side effects,  Urinary bladder changes, sitz bath prn;  Fatigue, skin irritation, n,v,d, low fiber diet if diarrhea,imodium prn, may need to eat 5-6 smaller meals and snacks, in between, baby wipes, sitz bath to use prn ,  Increase calories,protein in diet,drink plenty water stay hydrated,  No pain today, son can read english and will go over again with the patient at home with the marked pages in the rad book, verbal understanding, will review each week 9:28 AM Wt Readings from Last 3 Encounters:  12/25/15 192 lb 4.8 oz (87.227 kg)  12/22/15 197 lb 11.2 oz (89.676 kg)  12/09/15 197 lb (89.359 kg)  BP 121/69 mmHg  Pulse 72  Temp(Src) 97.2 F (36.2 C) (Oral)  Resp 16  Wt 192 lb 4.8 oz (87.227 kg)  PHYSICAL EXAMINATION: weight is 192 lb 4.8 oz (87.227 kg). His oral temperature is 97.2 F (36.2 C). His blood pressure is 121/69 and his pulse is 72. His respiration is 16.        ASSESSMENT: The patient is doing satisfactorily with treatment.  PLAN: We will continue with the patient's radiation treatment as planned.

## 2015-12-28 ENCOUNTER — Ambulatory Visit
Admission: RE | Admit: 2015-12-28 | Discharge: 2015-12-28 | Disposition: A | Discharge status: Home / Self Care | Payer: No Typology Code available for payment source | Source: Ambulatory Visit | Attending: Radiation Oncology | Admitting: Radiation Oncology

## 2015-12-28 DIAGNOSIS — Z510 Encounter for antineoplastic radiation therapy: Secondary | ICD-10-CM | POA: Diagnosis not present

## 2015-12-29 ENCOUNTER — Other Ambulatory Visit (HOSPITAL_BASED_OUTPATIENT_CLINIC_OR_DEPARTMENT_OTHER): Payer: PRIVATE HEALTH INSURANCE

## 2015-12-29 ENCOUNTER — Telehealth: Payer: Self-pay | Admitting: Pharmacist

## 2015-12-29 ENCOUNTER — Ambulatory Visit
Admission: RE | Admit: 2015-12-29 | Discharge: 2015-12-29 | Disposition: A | Discharge status: Home / Self Care | Payer: No Typology Code available for payment source | Source: Ambulatory Visit | Attending: Radiation Oncology | Admitting: Radiation Oncology

## 2015-12-29 DIAGNOSIS — C20 Malignant neoplasm of rectum: Secondary | ICD-10-CM | POA: Diagnosis not present

## 2015-12-29 DIAGNOSIS — Z510 Encounter for antineoplastic radiation therapy: Secondary | ICD-10-CM | POA: Diagnosis not present

## 2015-12-29 LAB — CBC WITH DIFFERENTIAL/PLATELET
BASO%: 0.1 % (ref 0.0–2.0)
Basophils Absolute: 0 10*3/uL (ref 0.0–0.1)
EOS%: 2.1 % (ref 0.0–7.0)
Eosinophils Absolute: 0.2 10*3/uL (ref 0.0–0.5)
HCT: 42.2 % (ref 38.4–49.9)
HGB: 14.9 g/dL (ref 13.0–17.1)
LYMPH#: 2.5 10*3/uL (ref 0.9–3.3)
LYMPH%: 35.9 % (ref 14.0–49.0)
MCH: 31.3 pg (ref 27.2–33.4)
MCHC: 35.3 g/dL (ref 32.0–36.0)
MCV: 88.7 fL (ref 79.3–98.0)
MONO#: 0.6 10*3/uL (ref 0.1–0.9)
MONO%: 7.8 % (ref 0.0–14.0)
NEUT#: 3.8 10*3/uL (ref 1.5–6.5)
NEUT%: 54.1 % (ref 39.0–75.0)
Platelets: 317 10*3/uL (ref 140–400)
RBC: 4.76 10*6/uL (ref 4.20–5.82)
RDW: 12.5 % (ref 11.0–14.6)
WBC: 7.1 10*3/uL (ref 4.0–10.3)

## 2015-12-29 LAB — COMPREHENSIVE METABOLIC PANEL
ALBUMIN: 3.9 g/dL (ref 3.5–5.0)
ALK PHOS: 56 U/L (ref 40–150)
ALT: 25 U/L (ref 0–55)
ANION GAP: 7 meq/L (ref 3–11)
AST: 13 U/L (ref 5–34)
BILIRUBIN TOTAL: 0.35 mg/dL (ref 0.20–1.20)
BUN: 21.7 mg/dL (ref 7.0–26.0)
CALCIUM: 9.3 mg/dL (ref 8.4–10.4)
CO2: 26 mEq/L (ref 22–29)
Chloride: 105 mEq/L (ref 98–109)
Creatinine: 1.1 mg/dL (ref 0.7–1.3)
EGFR: 72 mL/min/{1.73_m2} — AB (ref >90)
Glucose: 102 mg/dl (ref 70–140)
POTASSIUM: 4.2 meq/L (ref 3.5–5.1)
Sodium: 138 mEq/L (ref 136–145)
Total Protein: 7.1 g/dL (ref 6.4–8.3)

## 2015-12-29 NOTE — Telephone Encounter (Signed)
Left vm on Natasha (pts dtr) voice mail from [facility] to return my f/u call re: Xeloda. Kennith Center, Pharm.D., CPP 12/29/2015@10 :98 AM Oral [place]

## 2015-12-29 NOTE — Telephone Encounter (Signed)
Another call to pts dtr Natasha to f/u on Xeloda start.  No answer.  Left vm. Went down to XRT waiting area but pt had already gone home. Kennith Center, Pharm.D., CPP 12/29/2015@3 :[facility]

## 2015-12-29 NOTE — Telephone Encounter (Signed)
Oral Chemotherapy Follow-Up Form  Original Start date of oral chemotherapy: 12/22/15  Received call back from pts dtr-in-law, Natasha regarding patient's oral chemotherapy medication: Xeloda Current dose: 2000 mg in AM / 1500 mg in the PM on M-F w/ XRT.  Per [name], "so far, so good."  She says her father-in-law is eating regularly, smaller meals.  No swallowing difficulties, no mouth sores, no N/V.  I reminded [name] that pt has Zofran for N/V control if needed. She is unsure of diarrhea.  She has not heard from her mother-in-law about this.  I reminded her that pt has Imodium if needed.  Pt has missed 0 tablets/doses missed in the last week.   Labs from today are wnl.  Will follow up and call patient again in 2 weeks to determine tolerance to cycle 1 Xeloda.  Thank you, Kennith Center, Pharm.D., CPP 12/29/2015@4 :27 PM Oral Chemotherapy Clinic

## 2015-12-30 ENCOUNTER — Ambulatory Visit
Admission: RE | Admit: 2015-12-30 | Discharge: 2015-12-30 | Disposition: A | Discharge status: Home / Self Care | Payer: No Typology Code available for payment source | Source: Ambulatory Visit | Attending: Radiation Oncology | Admitting: Radiation Oncology

## 2015-12-30 ENCOUNTER — Encounter: Payer: Self-pay | Admitting: *Deleted

## 2015-12-30 DIAGNOSIS — Z510 Encounter for antineoplastic radiation therapy: Secondary | ICD-10-CM | POA: Diagnosis not present

## 2015-12-30 NOTE — Progress Notes (Signed)
Oncology Nurse Navigator Documentation  Oncology Nurse Navigator Flowsheets 12/30/2015  Navigator Location [facility]  Navigator Encounter Type Treatment  Abnormal Finding Date 11/23/2015  Confirmed Diagnosis Date 11/23/2015  Treatment Initiated Date 12/22/2015  Patient Visit Type RadOnc  Treatment Phase Active Tx--RT/Xeloda   Barriers/Navigation Needs No barriers at this time;No Questions;No Needs  Education -  Interventions None required  Referrals -  Support Groups/Services -  Acuity Level 1  Time Spent with Patient 15  David Jacobs reports no nausea or diarrhea. Skin is "OK".

## 2015-12-31 ENCOUNTER — Ambulatory Visit
Admission: RE | Admit: 2015-12-31 | Discharge: 2015-12-31 | Disposition: A | Discharge status: Home / Self Care | Payer: No Typology Code available for payment source | Source: Ambulatory Visit | Attending: Radiation Oncology | Admitting: Radiation Oncology

## 2015-12-31 DIAGNOSIS — Z510 Encounter for antineoplastic radiation therapy: Secondary | ICD-10-CM | POA: Diagnosis not present

## 2016-01-01 ENCOUNTER — Encounter: Payer: Self-pay | Admitting: Radiation Oncology

## 2016-01-01 ENCOUNTER — Ambulatory Visit
Admission: RE | Admit: 2016-01-01 | Discharge: 2016-01-01 | Disposition: A | Discharge status: Home / Self Care | Payer: No Typology Code available for payment source | Source: Ambulatory Visit | Attending: Radiation Oncology | Admitting: Radiation Oncology

## 2016-01-01 ENCOUNTER — Ambulatory Visit
Admission: RE | Admit: 2016-01-01 | Discharge: 2016-01-01 | Disposition: A | Discharge status: Home / Self Care | Payer: PRIVATE HEALTH INSURANCE | Source: Ambulatory Visit | Attending: Radiation Oncology | Admitting: Radiation Oncology

## 2016-01-01 VITALS — BP 154/76 | HR 72 | Temp 98.0°F | Resp 20 | Wt 198.6 lb

## 2016-01-01 DIAGNOSIS — C20 Malignant neoplasm of rectum: Secondary | ICD-10-CM

## 2016-01-01 DIAGNOSIS — Z510 Encounter for antineoplastic radiation therapy: Secondary | ICD-10-CM | POA: Diagnosis not present

## 2016-01-01 NOTE — Progress Notes (Addendum)
BP 154/76 mmHg  Pulse 72  Temp(Src) 98 F (36.7 C) (Oral)  Resp 20  Wt 198 lb 9.6 oz (90.084 kg)  Wt Readings from Last 3 Encounters:  01/01/16 198 lb 9.6 oz (90.084 kg)  12/25/15 192 lb 4.8 oz (87.227 kg)  12/22/15 197 lb 11.2 oz (89.676 kg)  weekly rad txs rectal, no c/o pain, regular bowel movements , no nausea, appetite good, no blader issues , fatigued no stated,Russian  interpreter  David Jacobs with patient and son 3:50 PM

## 2016-01-03 NOTE — Progress Notes (Signed)
   Department of Radiation Oncology  Phone:  [phone] Fax:        [phone]  Weekly Treatment Note    Name: David Jacobs Date: 01/03/2016 MRN: [id number] DOB: Nov 28, 1950   Diagnosis:     ICD-9-CM ICD-10-CM   1. Rectal cancer (HCC) 154.1 C20      Current dose: 16.2 Gy  Current fraction: 9   MEDICATIONS: Current Outpatient Prescriptions  Medication Sig Dispense Refill  . amLODipine (NORVASC) 5 MG tablet Take 1 tablet (5 mg total) by mouth daily. (Patient not taking: Reported on 12/25/2015) 30 tablet 1  . capecitabine (XELODA) 500 MG tablet Take 4 tabs (2000mg ) by mouth in the morning & 3 tabs (1500mg ) by mouth in the evening on the days of radiation only. Take with food. 105 tablet 1  . hydrocortisone cream 1 % Apply 1 application topically 2 (two) times daily. (Patient not taking: Reported on 12/22/2015) 30 g 0  . losartan-hydrochlorothiazide (HYZAAR) 100-25 MG tablet Take 1 tablet by mouth daily.    . ondansetron (ZOFRAN) 8 MG tablet Take 1 tablet (8 mg total) by mouth every 8 (eight) hours as needed for nausea. 30 tablet 2   No current facility-administered medications for this encounter.     ALLERGIES: Review of patient's allergies indicates no known allergies.   LABORATORY DATA:  Lab Results  Component Value Date   WBC 7.1 12/29/2015   HGB 14.9 12/29/2015   HCT 42.2 12/29/2015   MCV 88.7 12/29/2015   PLT 317 12/29/2015   Lab Results  Component Value Date   NA 138 12/29/2015   K 4.2 12/29/2015   CL 102 11/15/2015   CO2 26 12/29/2015   Lab Results  Component Value Date   ALT 25 12/29/2015   AST 13 12/29/2015   GGT 39 04/20/2015   ALKPHOS 56 12/29/2015   BILITOT 0.35 12/29/2015     NARRATIVE: David Jacobs was seen today for weekly treatment management. The chart was checked and the patient's films were reviewed.  BP 154/76 mmHg  Pulse 72  Temp(Src) 98 F (36.7 C) (Oral)  Resp 20  Wt 198 lb 9.6 oz (90.084 kg)  Wt Readings from Last 3  Encounters:  01/01/16 198 lb 9.6 oz (90.084 kg)  12/25/15 192 lb 4.8 oz (87.227 kg)  12/22/15 197 lb 11.2 oz (89.676 kg)  weekly rad txs rectal, no c/o pain, regular bowel movements , no nausea, appetite good, no blader issues , fatigued no stated,Russian  interpreter  David Jacobs with patient and son 1:51 PM  PHYSICAL EXAMINATION: weight is 198 lb 9.6 oz (90.084 kg). His oral temperature is 98 F (36.7 C). His blood pressure is 154/76 and his pulse is 72. His respiration is 20.        ASSESSMENT: The patient is doing satisfactorily with treatment.  PLAN: We will continue with the patient's radiation treatment as planned.

## 2016-01-04 ENCOUNTER — Ambulatory Visit
Admission: RE | Admit: 2016-01-04 | Discharge: 2016-01-04 | Disposition: A | Discharge status: Home / Self Care | Payer: No Typology Code available for payment source | Source: Ambulatory Visit | Attending: Radiation Oncology | Admitting: Radiation Oncology

## 2016-01-04 DIAGNOSIS — Z510 Encounter for antineoplastic radiation therapy: Secondary | ICD-10-CM | POA: Diagnosis not present

## 2016-01-05 ENCOUNTER — Other Ambulatory Visit (HOSPITAL_BASED_OUTPATIENT_CLINIC_OR_DEPARTMENT_OTHER): Payer: PRIVATE HEALTH INSURANCE

## 2016-01-05 ENCOUNTER — Ambulatory Visit
Admission: RE | Admit: 2016-01-05 | Discharge: 2016-01-05 | Disposition: A | Discharge status: Home / Self Care | Payer: No Typology Code available for payment source | Source: Ambulatory Visit | Attending: Radiation Oncology | Admitting: Radiation Oncology

## 2016-01-05 ENCOUNTER — Ambulatory Visit (HOSPITAL_BASED_OUTPATIENT_CLINIC_OR_DEPARTMENT_OTHER): Payer: PRIVATE HEALTH INSURANCE | Admitting: Nurse Practitioner

## 2016-01-05 VITALS — BP 135/65 | HR 75 | Temp 98.2°F | Resp 17 | Ht 71.0 in | Wt 198.2 lb

## 2016-01-05 DIAGNOSIS — C20 Malignant neoplasm of rectum: Secondary | ICD-10-CM | POA: Diagnosis not present

## 2016-01-05 DIAGNOSIS — I10 Essential (primary) hypertension: Secondary | ICD-10-CM

## 2016-01-05 DIAGNOSIS — Z720 Tobacco use: Secondary | ICD-10-CM

## 2016-01-05 DIAGNOSIS — Z510 Encounter for antineoplastic radiation therapy: Secondary | ICD-10-CM | POA: Diagnosis not present

## 2016-01-05 LAB — CBC WITH DIFFERENTIAL/PLATELET
BASO%: 0.4 % (ref 0.0–2.0)
Basophils Absolute: 0 10*3/uL (ref 0.0–0.1)
EOS%: 2.7 % (ref 0.0–7.0)
Eosinophils Absolute: 0.2 10*3/uL (ref 0.0–0.5)
HCT: 42.3 % (ref 38.4–49.9)
HGB: 14.3 g/dL (ref 13.0–17.1)
LYMPH%: 30.6 % (ref 14.0–49.0)
MCH: 30.6 pg (ref 27.2–33.4)
MCHC: 33.8 g/dL (ref 32.0–36.0)
MCV: 90.6 fL (ref 79.3–98.0)
MONO#: 0.5 10*3/uL (ref 0.1–0.9)
MONO%: 8.8 % (ref 0.0–14.0)
NEUT#: 3.2 10*3/uL (ref 1.5–6.5)
NEUT%: 57.5 % (ref 39.0–75.0)
Platelets: 244 10*3/uL (ref 140–400)
RBC: 4.67 10*6/uL (ref 4.20–5.82)
RDW: 13 % (ref 11.0–14.6)
WBC: 5.6 10*3/uL (ref 4.0–10.3)
lymph#: 1.7 10*3/uL (ref 0.9–3.3)

## 2016-01-05 LAB — COMPREHENSIVE METABOLIC PANEL
ALT: 20 U/L (ref 0–55)
ANION GAP: 7 meq/L (ref 3–11)
AST: 12 U/L (ref 5–34)
Albumin: 3.7 g/dL (ref 3.5–5.0)
Alkaline Phosphatase: 52 U/L (ref 40–150)
BUN: 14.9 mg/dL (ref 7.0–26.0)
CHLORIDE: 104 meq/L (ref 98–109)
CO2: 29 meq/L (ref 22–29)
CREATININE: 1.1 mg/dL (ref 0.7–1.3)
Calcium: 9.3 mg/dL (ref 8.4–10.4)
EGFR: 67 mL/min/{1.73_m2} — ABNORMAL LOW (ref >90)
Glucose: 99 mg/dl (ref 70–140)
POTASSIUM: 4.5 meq/L (ref 3.5–5.1)
Sodium: 139 mEq/L (ref 136–145)
Total Bilirubin: 0.52 mg/dL (ref 0.20–1.20)
Total Protein: 6.9 g/dL (ref 6.4–8.3)

## 2016-01-05 NOTE — Progress Notes (Signed)
  [place] OFFICE PROGRESS NOTE   Diagnosis:  Rectal cancer Oncology History   Rectal cancer [facility])  Staging form: Colon and Rectum, AJCC 7th Edition  Clinical stage from 12/09/2015: Stage IIA (T3, N0, M0) - Signed by Truitt Merle, MD on 12/22/2015       Rectal cancer ([place])   11/23/2015 Initial Diagnosis Rectal cancer ([place])   11/23/2015 Procedure Colonoscopy showed a large fungating ulcerated partially obstructing mass in the proximal rectum. This was biopsied.   11/23/2015 Initial Biopsy Rectal mass biopsy showed adenocarcinoma   11/25/2015 Imaging CT chest, abdomen and pelvis with contrast showed focal wall thickening involving the low rectum, no findings suspicious for metastatic disease.   12/09/2015 Procedure EUS showed a T3 N0 lesion in proximal rectum.   12/22/2015 -  Radiation Therapy New adjuvant irradiation to the rectal cancer   12/22/2015 -  Chemotherapy Neoadjuvant Xeloda 2000mg  in AM, and 1500mg  in PM with concurrent radiation        INTERVAL HISTORY:   David Jacobs returns as scheduled. He began neoadjuvant radiation/Xeloda 12/22/2015. He denies nausea/vomiting. No mouth sores. No diarrhea. No hand or foot pain or redness. Rectal bleeding has decreased. No rectal pain. He reports a good appetite. He is gaining weight.  Objective:  Vital signs in last 24 hours:  Blood pressure 135/65, pulse 75, temperature 98.2 F (36.8 C), temperature source Oral, resp. rate 17, height 5\' 11"  (1.803 m), weight 198 lb 3.2 oz (89.903 kg), SpO2 100 %.    HEENT: No thrush or ulcers. Resp: Lungs clear bilaterally. Cardio: Regular rate and rhythm. GI: Abdomen soft and nontender. No hepatomegaly. Vascular: No leg edema. Skin: Palms with mild erythema. No skin breakdown.    Lab Results:  Lab Results  Component Value Date   WBC 5.6 01/05/2016   HGB 14.3 01/05/2016   HCT 42.3 01/05/2016   MCV 90.6 01/05/2016   PLT 244 01/05/2016   NEUTROABS  3.2 01/05/2016    Imaging:  No results found.  Medications: I have reviewed the patient's current medications.  Assessment/Plan: 1. Proximal rectal adenocarcinoma, uT3N0M0, stage IIA. Concurrent radiation/Xeloda initiated 12/22/2015. 2. Rectal bleeding secondary to #1. Improved. 3. Hypertension. He is on losartan/hydrochlorothiazide. 4. Smoking and alcohol cessation.   Disposition: David Jacobs appears stable. He continues radiation/Xeloda. Labs reviewed. He will continue weekly blood work. He will return for a follow-up visit in 2 weeks.    Ned Card ANP/GNP-BC   01/05/2016  2:07 PM

## 2016-01-06 ENCOUNTER — Ambulatory Visit
Admission: RE | Admit: 2016-01-06 | Discharge: 2016-01-06 | Disposition: A | Discharge status: Home / Self Care | Payer: No Typology Code available for payment source | Source: Ambulatory Visit | Attending: Radiation Oncology | Admitting: Radiation Oncology

## 2016-01-06 DIAGNOSIS — Z510 Encounter for antineoplastic radiation therapy: Secondary | ICD-10-CM | POA: Diagnosis not present

## 2016-01-07 ENCOUNTER — Ambulatory Visit
Admission: RE | Admit: 2016-01-07 | Discharge: 2016-01-07 | Disposition: A | Discharge status: Home / Self Care | Payer: No Typology Code available for payment source | Source: Ambulatory Visit | Attending: Radiation Oncology | Admitting: Radiation Oncology

## 2016-01-07 DIAGNOSIS — Z510 Encounter for antineoplastic radiation therapy: Secondary | ICD-10-CM | POA: Diagnosis not present

## 2016-01-08 ENCOUNTER — Ambulatory Visit
Admission: RE | Admit: 2016-01-08 | Discharge: 2016-01-08 | Disposition: A | Discharge status: Home / Self Care | Payer: No Typology Code available for payment source | Source: Ambulatory Visit | Attending: Radiation Oncology | Admitting: Radiation Oncology

## 2016-01-08 ENCOUNTER — Encounter: Payer: Self-pay | Admitting: Radiation Oncology

## 2016-01-08 ENCOUNTER — Ambulatory Visit
Admission: RE | Admit: 2016-01-08 | Discharge: 2016-01-08 | Disposition: A | Discharge status: Home / Self Care | Payer: PRIVATE HEALTH INSURANCE | Source: Ambulatory Visit | Attending: Radiation Oncology | Admitting: Radiation Oncology

## 2016-01-08 VITALS — BP 136/63 | HR 76 | Temp 98.4°F | Resp 20 | Wt 199.3 lb

## 2016-01-08 DIAGNOSIS — Z510 Encounter for antineoplastic radiation therapy: Secondary | ICD-10-CM | POA: Diagnosis not present

## 2016-01-08 DIAGNOSIS — C20 Malignant neoplasm of rectum: Secondary | ICD-10-CM

## 2016-01-08 NOTE — Progress Notes (Signed)
   Department of Radiation Oncology  Phone:  [phone] Fax:        [phone]  Weekly Treatment Note    Name: David Jacobs Date: 01/09/2016 MRN: [id number] DOB: 06-06-1951   Diagnosis:     ICD-9-CM ICD-10-CM   1. Rectal cancer (HCC) 154.1 C20      Current dose: 25.2 Gy  Current fraction: 14   MEDICATIONS: Current Outpatient Prescriptions  Medication Sig Dispense Refill  . capecitabine (XELODA) 500 MG tablet Take 4 tabs (2000mg ) by mouth in the morning & 3 tabs (1500mg ) by mouth in the evening on the days of radiation only. Take with food. 105 tablet 1  . losartan-hydrochlorothiazide (HYZAAR) 100-25 MG tablet Take 1 tablet by mouth daily.    Marland Kitchen amLODipine (NORVASC) 5 MG tablet Take 1 tablet (5 mg total) by mouth daily. (Patient not taking: Reported on 12/25/2015) 30 tablet 1  . hydrocortisone cream 1 % Apply 1 application topically 2 (two) times daily. (Patient not taking: Reported on 12/22/2015) 30 g 0  . ondansetron (ZOFRAN) 8 MG tablet Take 1 tablet (8 mg total) by mouth every 8 (eight) hours as needed for nausea. (Patient not taking: Reported on 01/05/2016) 30 tablet 2   No current facility-administered medications for this encounter.     ALLERGIES: Review of patient's allergies indicates no known allergies.   LABORATORY DATA:  Lab Results  Component Value Date   WBC 5.6 01/05/2016   HGB 14.3 01/05/2016   HCT 42.3 01/05/2016   MCV 90.6 01/05/2016   PLT 244 01/05/2016   Lab Results  Component Value Date   NA 139 01/05/2016   K 4.5 01/05/2016   CL 102 11/15/2015   CO2 29 01/05/2016   Lab Results  Component Value Date   ALT 20 01/05/2016   AST 12 01/05/2016   GGT 39 04/20/2015   ALKPHOS 52 01/05/2016   BILITOT 0.52 01/05/2016     NARRATIVE: David Jacobs was seen today for weekly treatment management. The chart was checked and the patient's films were reviewed.  Weekly rad txs rectal 14/28 completed. No c/o pain, nausea, diarrhea, or bladder  difficulties. The patient has regular bowels, a good appetite, and no fatigue. An interpreter is present with the patient and his son.  BP 136/63 mmHg  Pulse 76  Temp(Src) 98.4 F (36.9 C) (Oral)  Resp 20  Wt 199 lb 4.8 oz (90.402 kg)  Wt Readings from Last 3 Encounters:  01/08/16 199 lb 4.8 oz (90.402 kg)  01/05/16 198 lb 3.2 oz (89.903 kg)  01/01/16 198 lb 9.6 oz (90.084 kg)   PHYSICAL EXAMINATION: weight is 199 lb 4.8 oz (90.402 kg). His oral temperature is 98.4 F (36.9 C). His blood pressure is 136/63 and his pulse is 76. His respiration is 20.        ASSESSMENT: The patient is doing satisfactorily with treatment.  PLAN: We will continue with the patient's radiation treatment as planned.   ------------------------------------------------  Jodelle Gross, MD, PhD  This document serves as a record of services personally performed by Shona Simpson, PA-C and Kyung Rudd, MD. It was created on their behalf by Darcus Austin, a trained medical scribe. The creation of this record is based on the scribe's personal observations and the providers' statements to them. This document has been checked and approved by the attending provider.

## 2016-01-08 NOTE — Progress Notes (Signed)
Weekly rad txs rectal 14/28 completd, no c/o pain, nausea or diarrhea, regular bowels, bladder okay no problems, appetite good, no fatigue, interpreter with pateint and son BP 136/63 mmHg  Pulse 76  Temp(Src) 98.4 F (36.9 C) (Oral)  Resp 20  Wt 199 lb 4.8 oz (90.402 kg)  Wt Readings from Last 3 Encounters:  01/08/16 199 lb 4.8 oz (90.402 kg)  01/05/16 198 lb 3.2 oz (89.903 kg)  01/01/16 198 lb 9.6 oz (90.084 kg)   3:27 PM'

## 2016-01-11 ENCOUNTER — Ambulatory Visit
Admission: RE | Admit: 2016-01-11 | Discharge: 2016-01-11 | Disposition: A | Discharge status: Home / Self Care | Payer: No Typology Code available for payment source | Source: Ambulatory Visit | Attending: Radiation Oncology | Admitting: Radiation Oncology

## 2016-01-11 DIAGNOSIS — Z510 Encounter for antineoplastic radiation therapy: Secondary | ICD-10-CM | POA: Diagnosis not present

## 2016-01-12 ENCOUNTER — Other Ambulatory Visit (HOSPITAL_BASED_OUTPATIENT_CLINIC_OR_DEPARTMENT_OTHER): Payer: PRIVATE HEALTH INSURANCE

## 2016-01-12 ENCOUNTER — Ambulatory Visit
Admission: RE | Admit: 2016-01-12 | Discharge: 2016-01-12 | Disposition: A | Discharge status: Home / Self Care | Payer: No Typology Code available for payment source | Source: Ambulatory Visit | Attending: Radiation Oncology | Admitting: Radiation Oncology

## 2016-01-12 ENCOUNTER — Encounter: Payer: Self-pay | Admitting: Hematology

## 2016-01-12 DIAGNOSIS — C20 Malignant neoplasm of rectum: Secondary | ICD-10-CM

## 2016-01-12 DIAGNOSIS — Z510 Encounter for antineoplastic radiation therapy: Secondary | ICD-10-CM | POA: Diagnosis not present

## 2016-01-12 LAB — COMPREHENSIVE METABOLIC PANEL
ALT: 25 U/L (ref 0–55)
ANION GAP: 9 meq/L (ref 3–11)
AST: 14 U/L (ref 5–34)
Albumin: 3.8 g/dL (ref 3.5–5.0)
Alkaline Phosphatase: 51 U/L (ref 40–150)
BUN: 16.6 mg/dL (ref 7.0–26.0)
CHLORIDE: 104 meq/L (ref 98–109)
CO2: 27 meq/L (ref 22–29)
Calcium: 9.5 mg/dL (ref 8.4–10.4)
Creatinine: 1.2 mg/dL (ref 0.7–1.3)
EGFR: 62 mL/min/{1.73_m2} — AB (ref >90)
GLUCOSE: 101 mg/dL (ref 70–140)
Potassium: 4.1 mEq/L (ref 3.5–5.1)
SODIUM: 140 meq/L (ref 136–145)
Total Bilirubin: 0.46 mg/dL (ref 0.20–1.20)
Total Protein: 7.1 g/dL (ref 6.4–8.3)

## 2016-01-12 LAB — CBC WITH DIFFERENTIAL/PLATELET
BASO%: 0.3 % (ref 0.0–2.0)
Basophils Absolute: 0 10*3/uL (ref 0.0–0.1)
EOS%: 3.1 % (ref 0.0–7.0)
Eosinophils Absolute: 0.2 10*3/uL (ref 0.0–0.5)
HCT: 42.7 % (ref 38.4–49.9)
HGB: 14.6 g/dL (ref 13.0–17.1)
LYMPH%: 25.5 % (ref 14.0–49.0)
MCH: 31.1 pg (ref 27.2–33.4)
MCHC: 34.3 g/dL (ref 32.0–36.0)
MCV: 90.8 fL (ref 79.3–98.0)
MONO#: 0.6 10*3/uL (ref 0.1–0.9)
MONO%: 9.1 % (ref 0.0–14.0)
NEUT%: 62 % (ref 39.0–75.0)
NEUTROS ABS: 3.9 10*3/uL (ref 1.5–6.5)
PLATELETS: 238 10*3/uL (ref 140–400)
RBC: 4.7 10*6/uL (ref 4.20–5.82)
RDW: 13.1 % (ref 11.0–14.6)
WBC: 6.3 10*3/uL (ref 4.0–10.3)
lymph#: 1.6 10*3/uL (ref 0.9–3.3)

## 2016-01-12 NOTE — Progress Notes (Signed)
[employer] left in my box-notes/labs 11/23/15-present faxed  [phone]

## 2016-01-13 ENCOUNTER — Ambulatory Visit
Admission: RE | Admit: 2016-01-13 | Discharge: 2016-01-13 | Disposition: A | Discharge status: Home / Self Care | Payer: No Typology Code available for payment source | Source: Ambulatory Visit | Attending: Radiation Oncology | Admitting: Radiation Oncology

## 2016-01-13 DIAGNOSIS — Z510 Encounter for antineoplastic radiation therapy: Secondary | ICD-10-CM | POA: Diagnosis not present

## 2016-01-14 ENCOUNTER — Ambulatory Visit
Admission: RE | Admit: 2016-01-14 | Discharge: 2016-01-14 | Disposition: A | Discharge status: Home / Self Care | Payer: No Typology Code available for payment source | Source: Ambulatory Visit | Attending: Radiation Oncology | Admitting: Radiation Oncology

## 2016-01-14 DIAGNOSIS — Z510 Encounter for antineoplastic radiation therapy: Secondary | ICD-10-CM | POA: Diagnosis not present

## 2016-01-15 ENCOUNTER — Ambulatory Visit
Admission: RE | Admit: 2016-01-15 | Discharge: 2016-01-15 | Disposition: A | Discharge status: Home / Self Care | Payer: No Typology Code available for payment source | Source: Ambulatory Visit | Attending: Radiation Oncology | Admitting: Radiation Oncology

## 2016-01-15 ENCOUNTER — Ambulatory Visit
Admission: RE | Admit: 2016-01-15 | Discharge: 2016-01-15 | Disposition: A | Discharge status: Home / Self Care | Payer: PRIVATE HEALTH INSURANCE | Source: Ambulatory Visit | Attending: Radiation Oncology | Admitting: Radiation Oncology

## 2016-01-15 ENCOUNTER — Encounter: Payer: Self-pay | Admitting: Radiation Oncology

## 2016-01-15 VITALS — BP 146/75 | HR 71 | Temp 98.2°F | Resp 20 | Wt 197.3 lb

## 2016-01-15 DIAGNOSIS — Z510 Encounter for antineoplastic radiation therapy: Secondary | ICD-10-CM | POA: Diagnosis not present

## 2016-01-15 DIAGNOSIS — C20 Malignant neoplasm of rectum: Secondary | ICD-10-CM

## 2016-01-15 NOTE — Progress Notes (Signed)
Weekly radiation rectum 19/28 completed, pain in am with bowel movement,  No blood in stool, sometimes slight dysuria, says growling in the abdomen, appetite good, energy level is same stated patient, good, interpreter with patient, takes Xeloda bid, no c/o nausea 3:21 PM BP 146/75 mmHg  Pulse 71  Temp(Src) 98.2 F (36.8 C) (Oral)  Resp 20  Wt 197 lb 4.8 oz (89.495 kg)  Wt Readings from Last 3 Encounters:  01/15/16 197 lb 4.8 oz (89.495 kg)  01/08/16 199 lb 4.8 oz (90.402 kg)  01/05/16 198 lb 3.2 oz (89.903 kg)

## 2016-01-15 NOTE — Progress Notes (Signed)
  Radiation Oncology         [id number]   Name: David Jacobs MRN: [id number]   Date: 01/15/2016  DOB: 1950/11/15   Weekly Radiation Therapy Management    ICD-9-CM ICD-10-CM   1. Rectal cancer (HCC) 154.1 C20     Current Dose: 34.2 Gy  Planned Dose:  50.4 Gy  Narrative The patient presents for routine under treatment assessment. Weekly radiation rectum, 19 completed. An interpreter was present during the encounter. The patient has pain in the morning with bowel movements. Denies blood in stool or nausea. He sometimes has slight dysuria, growling in the abdomen, good appetite, energy level is the same. He takes Xeloda bid. He reports occasional diarrhea. Set-up films were reviewed. The chart was checked.  Physical Findings  weight is 197 lb 4.8 oz (89.495 kg). His oral temperature is 98.2 F (36.8 C). His blood pressure is 146/75 and his pulse is 71. His respiration is 20. . Weight essentially stable.  No significant changes.  Impression The patient is tolerating radiation.  Plan Continue treatment as planned.    Sheral Apley Tammi Klippel, M.D.  This document serves as a record of services personally performed by Tyler Pita, MD. It was created on his behalf by Darcus Austin, a trained medical scribe. The creation of this record is based on the scribe's personal observations and the provider's statements to them. This document has been checked and approved by the attending provider.

## 2016-01-18 ENCOUNTER — Ambulatory Visit
Admission: RE | Admit: 2016-01-18 | Discharge: 2016-01-18 | Disposition: A | Discharge status: Home / Self Care | Payer: No Typology Code available for payment source | Source: Ambulatory Visit | Attending: Radiation Oncology | Admitting: Radiation Oncology

## 2016-01-18 DIAGNOSIS — Z510 Encounter for antineoplastic radiation therapy: Secondary | ICD-10-CM | POA: Diagnosis not present

## 2016-01-19 ENCOUNTER — Encounter: Payer: Self-pay | Admitting: Hematology

## 2016-01-19 ENCOUNTER — Ambulatory Visit (HOSPITAL_BASED_OUTPATIENT_CLINIC_OR_DEPARTMENT_OTHER): Payer: PRIVATE HEALTH INSURANCE | Admitting: Hematology

## 2016-01-19 ENCOUNTER — Ambulatory Visit
Admission: RE | Admit: 2016-01-19 | Discharge: 2016-01-19 | Disposition: A | Discharge status: Home / Self Care | Payer: No Typology Code available for payment source | Source: Ambulatory Visit | Attending: Radiation Oncology | Admitting: Radiation Oncology

## 2016-01-19 ENCOUNTER — Telehealth: Payer: Self-pay | Admitting: Hematology

## 2016-01-19 ENCOUNTER — Other Ambulatory Visit (HOSPITAL_BASED_OUTPATIENT_CLINIC_OR_DEPARTMENT_OTHER): Payer: PRIVATE HEALTH INSURANCE

## 2016-01-19 VITALS — BP 147/70 | HR 71 | Temp 98.2°F | Resp 18 | Ht 71.0 in | Wt 198.4 lb

## 2016-01-19 DIAGNOSIS — C20 Malignant neoplasm of rectum: Secondary | ICD-10-CM | POA: Diagnosis not present

## 2016-01-19 DIAGNOSIS — Z720 Tobacco use: Secondary | ICD-10-CM | POA: Diagnosis not present

## 2016-01-19 DIAGNOSIS — I10 Essential (primary) hypertension: Secondary | ICD-10-CM

## 2016-01-19 DIAGNOSIS — Z510 Encounter for antineoplastic radiation therapy: Secondary | ICD-10-CM | POA: Diagnosis not present

## 2016-01-19 LAB — CBC WITH DIFFERENTIAL/PLATELET
BASO%: 0.4 % (ref 0.0–2.0)
BASOS ABS: 0 10*3/uL (ref 0.0–0.1)
EOS ABS: 0.2 10*3/uL (ref 0.0–0.5)
EOS%: 2.2 % (ref 0.0–7.0)
HCT: 42.5 % (ref 38.4–49.9)
HEMOGLOBIN: 14.4 g/dL (ref 13.0–17.1)
LYMPH%: 13.8 % — AB (ref 14.0–49.0)
MCH: 31 pg (ref 27.2–33.4)
MCHC: 33.9 g/dL (ref 32.0–36.0)
MCV: 91.5 fL (ref 79.3–98.0)
MONO#: 0.6 10*3/uL (ref 0.1–0.9)
MONO%: 8.5 % (ref 0.0–14.0)
NEUT#: 5.4 10*3/uL (ref 1.5–6.5)
NEUT%: 75.1 % — AB (ref 39.0–75.0)
Platelets: 224 10*3/uL (ref 140–400)
RBC: 4.65 10*6/uL (ref 4.20–5.82)
RDW: 13.4 % (ref 11.0–14.6)
WBC: 7.3 10*3/uL (ref 4.0–10.3)
lymph#: 1 10*3/uL (ref 0.9–3.3)

## 2016-01-19 LAB — COMPREHENSIVE METABOLIC PANEL
ALK PHOS: 59 U/L (ref 40–150)
ALT: 17 U/L (ref 0–55)
AST: 10 U/L (ref 5–34)
Albumin: 3.8 g/dL (ref 3.5–5.0)
Anion Gap: 8 mEq/L (ref 3–11)
BUN: 13.9 mg/dL (ref 7.0–26.0)
CHLORIDE: 104 meq/L (ref 98–109)
CO2: 26 mEq/L (ref 22–29)
Calcium: 9.4 mg/dL (ref 8.4–10.4)
Creatinine: 1 mg/dL (ref 0.7–1.3)
EGFR: 75 mL/min/{1.73_m2} — AB (ref >90)
GLUCOSE: 103 mg/dL (ref 70–140)
POTASSIUM: 4 meq/L (ref 3.5–5.1)
SODIUM: 138 meq/L (ref 136–145)
Total Bilirubin: 0.46 mg/dL (ref 0.20–1.20)
Total Protein: 6.9 g/dL (ref 6.4–8.3)

## 2016-01-19 NOTE — Telephone Encounter (Signed)
interpretor left a msg confirming 7/7 apt w/ dr Burr Medico

## 2016-01-19 NOTE — Progress Notes (Signed)
[name]  [phone] [phone]  Clinic Follow Up Note   Patient Care Team: No Pcp Per Patient as PCP - General (General Practice) 01/19/2016   CHIEF COMPLAINTS:  Follow Up rectal cancer  Oncology History   Rectal cancer [facility])   Staging form: Colon and Rectum, AJCC 7th Edition     Clinical stage from 12/09/2015: Stage IIA (T3, N0, M0) - Signed by Truitt Merle, MD on 12/22/2015       Rectal cancer ([place])   11/23/2015 Initial Diagnosis Rectal cancer ([name])   11/23/2015 Procedure Colonoscopy showed a large fungating ulcerated partially obstructing mass in the proximal rectum. This was biopsied.   11/23/2015 Initial Biopsy Rectal mass biopsy showed adenocarcinoma   11/25/2015 Imaging CT chest, abdomen and pelvis with contrast showed focal wall thickening involving the low rectum, no findings suspicious for metastatic disease.   12/09/2015 Procedure EUS showed a T3 N0 lesion in proximal rectum.   12/22/2015 -  Radiation Therapy New adjuvant irradiation to the rectal cancer   12/22/2015 -  Chemotherapy Neoadjuvant Xeloda 2000mg  in AM, and 1500mg  in PM with concurrent radiation     HISTORY OF PRESENTING ILLNESS (12/07/2015):  David Jacobs 65 y.o. male is here because of His recently diagnosed rectal cancer. He is accompanied by an interpreter and daughter-in-law David Jacobs,  to the clinic today.  He has been having rectal bleeding for 3 months, stable overall, small amount, usually once a day, no pain, he otherwise feels well, no fatigue or weight change lately. He was referred by his primary care physician to gastroenterologist Dr. Paulita Fujita, and underwent colonoscopy on 11/23/2015. It showed a fungating ulcerated partially obstructing mass in the proximal rectum, They showed adenocarcinoma. CT scan revealed no evidence of distant metastasis.He was referred to [place] for further management.  He works for the [employer]still works full-time. He livers with his wife, one  of his son and his family. He never had a colonoscopy before he reason one. No family history of colon cancer.    CURRENT THERAPY:  Concurrent radiation and chemotherapy with  Xeloa  2000 mg in the morning and 1500 mg in the evening  INTERIM HISTORY: Mr. Ortmann returns for follow-up. He is tolerating chemoRT well, he has ractal soreness and burning feeling, and urgency to move his bowel, has 2-3 BM a day, sometime watery, he is eating well, but does not feel thirsty to drink (he drinks about 756ml water a day). No other complains. He has good energy level, no fever and chills.    MEDICAL HISTORY:  Past Medical History  Diagnosis Date  . Hypertension     SURGICAL HISTORY: Past Surgical History  Procedure Laterality Date  . Eus N/A 12/09/2015    Procedure: LOWER ENDOSCOPIC ULTRASOUND (EUS);  Surgeon: [name], MD;  Location: [facility];  Service: Endoscopy;  Laterality: N/A;    SOCIAL HISTORY: Social History   Social History  . Marital Status: Married    Spouse Name: N/A  . Number of Children: 2  . Years of Education: N/A   Occupational History  . Not on file.   Social History Main Topics  . Smoking status: Current Every Day Smoker -- 0.50 packs/day for 35 years  . Smokeless tobacco: Not on file  . Alcohol Use: 1.2 - 1.8 oz/week    2-3 Cans of beer, 0 Standard drinks or equivalent per week     Comment: a few beers a week   . Drug Use: Not on  file  . Sexual Activity: Not on file   Other Topics Concern  . Not on file   Social History Narrative   Married, lives w/his wife and a son--minimal English   Works full time at [employer]   Current everyday smoker, with no plans to quit   Primary contact is daughter-in-law, [name] (can interpret for him)    FAMILY HISTORY: Family History  Problem Relation Age of Onset  . Cancer Mother 53    throid cancer   . Hyperlipidemia Mother   . Diabetes Father     ALLERGIES:  has No Known  Allergies.  MEDICATIONS:  Current Outpatient Prescriptions  Medication Sig Dispense Refill  . amLODipine (NORVASC) 5 MG tablet Take 1 tablet (5 mg total) by mouth daily. 30 tablet 1  . capecitabine (XELODA) 500 MG tablet Take 4 tabs (2000mg ) by mouth in the morning & 3 tabs (1500mg ) by mouth in the evening on the days of radiation only. Take with food. 105 tablet 1  . losartan-hydrochlorothiazide (HYZAAR) 100-25 MG tablet Take 1 tablet by mouth daily.    . hydrocortisone cream 1 % Apply 1 application topically 2 (two) times daily. (Patient not taking: Reported on 12/22/2015) 30 g 0  . ondansetron (ZOFRAN) 8 MG tablet Take 1 tablet (8 mg total) by mouth every 8 (eight) hours as needed for nausea. (Patient not taking: Reported on 01/05/2016) 30 tablet 2   No current facility-administered medications for this visit.    REVIEW OF SYSTEMS:   Constitutional: Denies fevers, chills or abnormal night sweats Eyes: Denies blurriness of vision, double vision or watery eyes Ears, nose, mouth, throat, and face: Denies mucositis or sore throat Respiratory: Denies cough, dyspnea or wheezes Cardiovascular: Denies palpitation, chest discomfort or lower extremity swelling Gastrointestinal:  Denies nausea, heartburn or change in bowel habits Skin: Denies abnormal skin rashes Lymphatics: Denies new lymphadenopathy or easy bruising Neurological:Denies numbness, tingling or new weaknesses Behavioral/Psych: Mood is stable, no new changes  All other systems were reviewed with the patient and are negative.  PHYSICAL EXAMINATION: ECOG PERFORMANCE STATUS: 0 - Asymptomatic  Filed Vitals:   01/19/16 1424  BP: 147/70  Pulse: 71  Temp: 98.2 F (36.8 C)  Resp: 18   Filed Weights   01/19/16 1424  Weight: 198 lb 6.4 oz (89.994 kg)    GENERAL:alert, no distress and comfortable SKIN: skin color, texture, turgor are normal, no rashes or significant lesions EYES: normal, conjunctiva are pink and non-injected,  sclera clear OROPHARYNX:no exudate, no erythema and lips, buccal mucosa, and tongue normal  NECK: supple, thyroid normal size, non-tender, without nodularity LYMPH:  no palpable lymphadenopathy in the cervical, axillary or inguinal LUNGS: clear to auscultation and percussion with normal breathing effort HEART: regular rate & rhythm and no murmurs and no lower extremity edema ABDOMEN:abdomen soft, non-tender and normal bowel sounds, no organomegaly, rectal exam was deferred due to his multiple exam lately.  Musculoskeletal:no cyanosis of digits and no clubbing  PSYCH: alert & oriented x 3 with fluent speech NEURO: no focal motor/sensory deficits  LABORATORY DATA:  I have reviewed the data as listed CBC Latest Ref Rng 01/19/2016 01/12/2016 01/05/2016  WBC 4.0 - 10.3 10e3/uL 7.3 6.3 5.6  Hemoglobin 13.0 - 17.1 g/dL 14.4 14.6 14.3  Hematocrit 38.4 - 49.9 % 42.5 42.7 42.3  Platelets 140 - 400 10e3/uL 224 238 244    CMP Latest Ref Rng 01/19/2016 01/12/2016 01/05/2016  Glucose 70 - 140 mg/dl 103 101 99  BUN  7.0 - 26.0 mg/dL 13.9 16.6 14.9  Creatinine 0.7 - 1.3 mg/dL 1.0 1.2 1.1  Sodium 136 - 145 mEq/L 138 140 139  Potassium 3.5 - 5.1 mEq/L 4.0 4.1 4.5  CO2 22 - 29 mEq/L 26 27 29   Calcium 8.4 - 10.4 mg/dL 9.4 9.5 9.3  Total Protein 6.4 - 8.3 g/dL 6.9 7.1 6.9  Total Bilirubin 0.20 - 1.20 mg/dL 0.46 0.46 0.52  Alkaline Phos 40 - 150 U/L 59 51 52  AST 5 - 34 U/L 10 14 12   ALT 0 - 55 U/L 17 25 20      PATHOLOGY REPORT: Rectum biopsy 11/23/2015 -Invasive adenocarcinoma  Microscopic comment Interval Departmental review obtained (Dr. Lyndon Code) with agreement.  RADIOGRAPHIC STUDIES: I have personally reviewed the radiological images as listed and agreed with the findings in the report. No results found. COLONOSCOPY Dr. Paulita Fujita 11/23/2015  -A front-like villous fungating polypoid sessile and ulcerated partially obstructing large mass was found in the proximal rectum. The mass was partially  circumferential (involving one half of the lumen circumference). Bruising was present. This was biopsied. -Non-thrombosed external hemorrhoids -A single nonbleeding clonic angiodysplastic lesion in the hepatic flexure and descending colon  EUS 12/09/2015 - Non-thrombosed external hemorrhoids found on perianal exam. - Malignant partially obstructing tumor in the proximal rectum. - Endosonographic images of the perirectal space were unremarkable. - No lymph nodes were seen in the perirectal region and in a peritumoral location during endosonographic examination. - Rectal mass was visualized endosonographically. A tissue diagnosis was obtained prior to this exam. This is of adenocarcinoma. This was staged uT3 uN0.   ASSESSMENT & PLAN: 65 year old male with past medical history of hypertension, presented with intermittent rectal bleeding for 3 months.   1. Proximal rectal adenocarcinoma, uT3N0M0, stage IIA --I discussed his CT scan finding, colonoscopy and biopsy results in great details with patient and his family members. -his EUS showed a T3 N0 disease -We discussed the risk of cancer recurrence with surgery alone, and multidisciplinary approach is needed for locally advanced disease. -he is on concurrent chemotherapy and irradiation, with Xeloda 2000 mg in the a.m., 1500 mg in the evening, tolerating well overall.  -Lab results reviewed with him, CBC and CMP are within normal limits. We will continue monitoring his labs weekly -I encouraged him to drink more liquid, given his diarrhea.  2. HTN -He is losartan-HCTZ, we discussed potential impact of chemotherapy radiation on his blood pressure, and we may need hold hydrochlorothiazide as needed if his blood pressure goes low.  3. Smoking and alcohol cessation -He smokes half pack a day,I strongly encouraged him to cut back and quit -I also recommend him not to drink any alcohol when he is on chemotherapy and radiation. He agrees  Plan   -Continue Xeloda daily Monday through Friday hen he is on radiation -Lab CBC and CMP every week  -I will see him back on his last day of treatment on 7/7 -I sent a message to Dr. Marcello Moores for his follow-up appointment after he completes chemotherapy and radiation.   All questions were answered. The patient knows to call the clinic with any problems, questions or concerns. I spent 20 minutes counseling the patient face to face. The total time spent in the appointment was 25 minutes and more than 50% was on counseling.     Truitt Merle, MD 01/19/2016 2:49 PM

## 2016-01-20 ENCOUNTER — Ambulatory Visit
Admission: RE | Admit: 2016-01-20 | Discharge: 2016-01-20 | Disposition: A | Discharge status: Home / Self Care | Payer: No Typology Code available for payment source | Source: Ambulatory Visit | Attending: Radiation Oncology | Admitting: Radiation Oncology

## 2016-01-20 DIAGNOSIS — Z510 Encounter for antineoplastic radiation therapy: Secondary | ICD-10-CM | POA: Diagnosis not present

## 2016-01-21 ENCOUNTER — Ambulatory Visit
Admission: RE | Admit: 2016-01-21 | Discharge: 2016-01-21 | Disposition: A | Discharge status: Home / Self Care | Payer: No Typology Code available for payment source | Source: Ambulatory Visit | Attending: Radiation Oncology | Admitting: Radiation Oncology

## 2016-01-21 ENCOUNTER — Encounter: Payer: Self-pay | Admitting: Radiation Oncology

## 2016-01-21 DIAGNOSIS — Z510 Encounter for antineoplastic radiation therapy: Secondary | ICD-10-CM | POA: Diagnosis not present

## 2016-01-22 ENCOUNTER — Encounter: Payer: Self-pay | Admitting: *Deleted

## 2016-01-22 ENCOUNTER — Ambulatory Visit
Admission: RE | Admit: 2016-01-22 | Discharge: 2016-01-22 | Disposition: A | Discharge status: Home / Self Care | Payer: No Typology Code available for payment source | Source: Ambulatory Visit | Attending: Radiation Oncology | Admitting: Radiation Oncology

## 2016-01-22 ENCOUNTER — Ambulatory Visit
Admission: RE | Admit: 2016-01-22 | Discharge: 2016-01-22 | Disposition: A | Discharge status: Home / Self Care | Payer: PRIVATE HEALTH INSURANCE | Source: Ambulatory Visit | Attending: Radiation Oncology | Admitting: Radiation Oncology

## 2016-01-22 ENCOUNTER — Encounter: Payer: Self-pay | Admitting: Radiation Oncology

## 2016-01-22 VITALS — BP 146/66 | HR 72 | Temp 98.4°F | Resp 20 | Wt 197.9 lb

## 2016-01-22 DIAGNOSIS — Z510 Encounter for antineoplastic radiation therapy: Secondary | ICD-10-CM | POA: Diagnosis not present

## 2016-01-22 DIAGNOSIS — C20 Malignant neoplasm of rectum: Secondary | ICD-10-CM

## 2016-01-22 NOTE — Progress Notes (Signed)
Weekly rad txs rectum 24/28bottom completed, small abrasion at top of  Bottom  In the midle, having burning with bowel movements, no nausea or diarrhea, no other skin breakdown seen , no bleeding in either stool or urine, appetite good, his surgery will be in late August od early September Takes Xeloda bid after meals 3:33 PM BP 146/66 mmHg  Pulse 72  Temp(Src) 98.4 F (36.9 C) (Oral)  Resp 20  Wt 197 lb 14.4 oz (89.767 kg)  Wt Readings from Last 3 Encounters:  01/22/16 197 lb 14.4 oz (89.767 kg)  01/19/16 198 lb 6.4 oz (89.994 kg)  01/15/16 197 lb 4.8 oz (89.495 kg)

## 2016-01-22 NOTE — Progress Notes (Signed)
   Department of Radiation Oncology  Phone:  [phone] Fax:        [phone]  Weekly Treatment Note    Name: David Jacobs Date: 01/22/2016 MRN: [id number] DOB: 1951/07/01   Diagnosis:     ICD-9-CM ICD-10-CM   1. Rectal cancer (HCC) 154.1 C20      Current dose: 43.2 Gy  Current fraction:24   MEDICATIONS: Current Outpatient Prescriptions  Medication Sig Dispense Refill  . amLODipine (NORVASC) 5 MG tablet Take 1 tablet (5 mg total) by mouth daily. 30 tablet 1  . capecitabine (XELODA) 500 MG tablet Take 4 tabs (2000mg ) by mouth in the morning & 3 tabs (1500mg ) by mouth in the evening on the days of radiation only. Take with food. 105 tablet 1  . losartan-hydrochlorothiazide (HYZAAR) 100-25 MG tablet Take 1 tablet by mouth daily.    . hydrocortisone cream 1 % Apply 1 application topically 2 (two) times daily. (Patient not taking: Reported on 12/22/2015) 30 g 0  . ondansetron (ZOFRAN) 8 MG tablet Take 1 tablet (8 mg total) by mouth every 8 (eight) hours as needed for nausea. (Patient not taking: Reported on 01/05/2016) 30 tablet 2   No current facility-administered medications for this encounter.     ALLERGIES: Review of patient's allergies indicates no known allergies.   LABORATORY DATA:  Lab Results  Component Value Date   WBC 7.3 01/19/2016   HGB 14.4 01/19/2016   HCT 42.5 01/19/2016   MCV 91.5 01/19/2016   PLT 224 01/19/2016   Lab Results  Component Value Date   NA 138 01/19/2016   K 4.0 01/19/2016   CL 102 11/15/2015   CO2 26 01/19/2016   Lab Results  Component Value Date   ALT 17 01/19/2016   AST 10 01/19/2016   GGT 39 04/20/2015   ALKPHOS 59 01/19/2016   BILITOT 0.46 01/19/2016     NARRATIVE: Basilio Dorvil was seen today for weekly treatment management. The chart was checked and the patient's films were reviewed.  Weekly rad txs rectum 24/28bottom completed, small abrasion at top of  Bottom  In the midle, having burning with bowel movements, no  nausea or diarrhea, no other skin breakdown seen , no bleeding in either stool or urine, appetite good, his surgery will be in late August od early September Takes Xeloda bid after meals 5:16 PM BP 146/66 mmHg  Pulse 72  Temp(Src) 98.4 F (36.9 C) (Oral)  Resp 20  Wt 197 lb 14.4 oz (89.767 kg)  Wt Readings from Last 3 Encounters:  01/22/16 197 lb 14.4 oz (89.767 kg)  01/19/16 198 lb 6.4 oz (89.994 kg)  01/15/16 197 lb 4.8 oz (89.495 kg)    PHYSICAL EXAMINATION: weight is 197 lb 14.4 oz (89.767 kg). His oral temperature is 98.4 F (36.9 C). His blood pressure is 146/66 and his pulse is 72. His respiration is 20.        ASSESSMENT: The patient is doing satisfactorily with treatment.  PLAN: We will continue with the patient's radiation treatment as planned.

## 2016-01-22 NOTE — Progress Notes (Signed)
Oncology Nurse Navigator Documentation  Oncology Nurse Navigator Flowsheets 12/07/2015 12/30/2015 01/22/2016  Navigator Location [facility]  Navigator Encounter Type Initial MedOnc Treatment Treatment  Abnormal Finding Date - 11/23/2015 11/23/2015  Confirmed Diagnosis Date - 11/23/2015 11/23/2015  Treatment Initiated Date - 12/22/2015 12/22/2015  Patient Visit Type MedOnc;Initial RadOnc RadOnc  Treatment Phase Pre-Tx/Tx Discussion Active Tx Active Tx--RT/Xeloda  Barriers/Navigation Needs Family concerns;Education No barriers at this time;No Questions;No Needs Education  Education Actor Options;Coping with Diagnosis/ Prognosis;Newly Diagnosed Cancer Education;Preparing for Upcoming Surgery/ Treatment;Other - Pain/ Symptom Management--coccyx skin breakdown. Reinforced advice of rad onc nurse, Thayer Headings RN to apply Neosporin ointment (OTC) to broken area.  Printed appointment calendar for July.  Interventions Coordination of Care;Referrals None required Education Method  Referrals Social Work - -  Education Method - - Verbal w/assistance of interpreter  Support Groups/Services Other - -  Acuity Level 2 Level 1 Level 1  Time Spent with Patient [id number]

## 2016-01-25 ENCOUNTER — Ambulatory Visit
Admission: RE | Admit: 2016-01-25 | Discharge: 2016-01-25 | Disposition: A | Discharge status: Home / Self Care | Payer: No Typology Code available for payment source | Source: Ambulatory Visit | Attending: Radiation Oncology | Admitting: Radiation Oncology

## 2016-01-25 DIAGNOSIS — Z510 Encounter for antineoplastic radiation therapy: Secondary | ICD-10-CM | POA: Diagnosis not present

## 2016-01-27 ENCOUNTER — Ambulatory Visit
Admission: RE | Admit: 2016-01-27 | Discharge: 2016-01-27 | Disposition: A | Discharge status: Home / Self Care | Payer: No Typology Code available for payment source | Source: Ambulatory Visit | Attending: Radiation Oncology | Admitting: Radiation Oncology

## 2016-01-27 ENCOUNTER — Other Ambulatory Visit (HOSPITAL_BASED_OUTPATIENT_CLINIC_OR_DEPARTMENT_OTHER): Payer: PRIVATE HEALTH INSURANCE

## 2016-01-27 DIAGNOSIS — Z510 Encounter for antineoplastic radiation therapy: Secondary | ICD-10-CM | POA: Diagnosis not present

## 2016-01-27 DIAGNOSIS — C20 Malignant neoplasm of rectum: Secondary | ICD-10-CM

## 2016-01-27 LAB — CBC WITH DIFFERENTIAL/PLATELET
BASO%: 0.5 % (ref 0.0–2.0)
Basophils Absolute: 0 10*3/uL (ref 0.0–0.1)
EOS%: 2.1 % (ref 0.0–7.0)
Eosinophils Absolute: 0.1 10*3/uL (ref 0.0–0.5)
HEMATOCRIT: 39.8 % (ref 38.4–49.9)
HEMOGLOBIN: 13.8 g/dL (ref 13.0–17.1)
LYMPH#: 0.9 10*3/uL (ref 0.9–3.3)
LYMPH%: 13.1 % — ABNORMAL LOW (ref 14.0–49.0)
MCH: 32 pg (ref 27.2–33.4)
MCHC: 34.6 g/dL (ref 32.0–36.0)
MCV: 92.3 fL (ref 79.3–98.0)
MONO#: 0.6 10*3/uL (ref 0.1–0.9)
MONO%: 9.7 % (ref 0.0–14.0)
NEUT#: 5 10*3/uL (ref 1.5–6.5)
NEUT%: 74.6 % (ref 39.0–75.0)
Platelets: 282 10*3/uL (ref 140–400)
RBC: 4.31 10*6/uL (ref 4.20–5.82)
RDW: 15.1 % — AB (ref 11.0–14.6)
WBC: 6.7 10*3/uL (ref 4.0–10.3)

## 2016-01-27 LAB — COMPREHENSIVE METABOLIC PANEL
ALT: 13 U/L (ref 0–55)
AST: 11 U/L (ref 5–34)
Albumin: 3.8 g/dL (ref 3.5–5.0)
Alkaline Phosphatase: 56 U/L (ref 40–150)
Anion Gap: 9 mEq/L (ref 3–11)
BUN: 12.6 mg/dL (ref 7.0–26.0)
CALCIUM: 9.5 mg/dL (ref 8.4–10.4)
CHLORIDE: 105 meq/L (ref 98–109)
CO2: 25 mEq/L (ref 22–29)
CREATININE: 1 mg/dL (ref 0.7–1.3)
EGFR: 78 mL/min/{1.73_m2} — ABNORMAL LOW (ref >90)
GLUCOSE: 112 mg/dL (ref 70–140)
Potassium: 3.9 mEq/L (ref 3.5–5.1)
Sodium: 140 mEq/L (ref 136–145)
Total Bilirubin: 0.37 mg/dL (ref 0.20–1.20)
Total Protein: 7 g/dL (ref 6.4–8.3)

## 2016-01-28 ENCOUNTER — Ambulatory Visit
Admission: RE | Admit: 2016-01-28 | Discharge: 2016-01-28 | Disposition: A | Discharge status: Home / Self Care | Payer: No Typology Code available for payment source | Source: Ambulatory Visit | Attending: Radiation Oncology | Admitting: Radiation Oncology

## 2016-01-28 DIAGNOSIS — Z510 Encounter for antineoplastic radiation therapy: Secondary | ICD-10-CM | POA: Diagnosis not present

## 2016-01-29 ENCOUNTER — Encounter: Payer: Self-pay | Admitting: Radiation Oncology

## 2016-01-29 ENCOUNTER — Encounter: Payer: Self-pay | Admitting: Hematology

## 2016-01-29 ENCOUNTER — Ambulatory Visit: Payer: No Typology Code available for payment source

## 2016-01-29 ENCOUNTER — Ambulatory Visit
Admission: RE | Admit: 2016-01-29 | Discharge: 2016-01-29 | Disposition: A | Discharge status: Home / Self Care | Payer: PRIVATE HEALTH INSURANCE | Source: Ambulatory Visit | Attending: Radiation Oncology | Admitting: Radiation Oncology

## 2016-01-29 ENCOUNTER — Ambulatory Visit
Admission: RE | Admit: 2016-01-29 | Discharge: 2016-01-29 | Disposition: A | Discharge status: Home / Self Care | Payer: No Typology Code available for payment source | Source: Ambulatory Visit | Attending: Radiation Oncology | Admitting: Radiation Oncology

## 2016-01-29 ENCOUNTER — Ambulatory Visit (HOSPITAL_BASED_OUTPATIENT_CLINIC_OR_DEPARTMENT_OTHER): Payer: PRIVATE HEALTH INSURANCE | Admitting: Hematology

## 2016-01-29 VITALS — BP 157/62 | HR 72 | Temp 98.3°F | Resp 18 | Ht 71.0 in | Wt 198.9 lb

## 2016-01-29 VITALS — BP 142/62 | HR 67 | Temp 98.2°F | Ht 71.0 in | Wt 199.2 lb

## 2016-01-29 DIAGNOSIS — Z510 Encounter for antineoplastic radiation therapy: Secondary | ICD-10-CM | POA: Diagnosis not present

## 2016-01-29 DIAGNOSIS — C20 Malignant neoplasm of rectum: Secondary | ICD-10-CM

## 2016-01-29 DIAGNOSIS — Z720 Tobacco use: Secondary | ICD-10-CM

## 2016-01-29 DIAGNOSIS — I10 Essential (primary) hypertension: Secondary | ICD-10-CM

## 2016-01-29 NOTE — Progress Notes (Signed)
   Department of Radiation Oncology  Phone:  [phone] Fax:        [phone]  Weekly Treatment Note    Name: David Jacobs Date: 01/29/2016 MRN: [id number] DOB: 05-08-1951   Diagnosis:     ICD-9-CM ICD-10-CM   1. Rectal cancer (HCC) 154.1 C20      Current dose: 50.4 Gy  Current fraction: 28   MEDICATIONS: Current Outpatient Prescriptions  Medication Sig Dispense Refill  . amLODipine (NORVASC) 5 MG tablet Take 1 tablet (5 mg total) by mouth daily. 30 tablet 1  . capecitabine (XELODA) 500 MG tablet Take 4 tabs (2000mg ) by mouth in the morning & 3 tabs (1500mg ) by mouth in the evening on the days of radiation only. Take with food. 105 tablet 1  . hydrocortisone cream 1 % Apply 1 application topically 2 (two) times daily. 30 g 0  . losartan-hydrochlorothiazide (HYZAAR) 100-25 MG tablet Take 1 tablet by mouth daily.    . ondansetron (ZOFRAN) 8 MG tablet Take 1 tablet (8 mg total) by mouth every 8 (eight) hours as needed for nausea. 30 tablet 2   No current facility-administered medications for this encounter.     ALLERGIES: Review of patient's allergies indicates no known allergies.   LABORATORY DATA:  Lab Results  Component Value Date   WBC 6.7 01/27/2016   HGB 13.8 01/27/2016   HCT 39.8 01/27/2016   MCV 92.3 01/27/2016   PLT 282 01/27/2016   Lab Results  Component Value Date   NA 140 01/27/2016   K 3.9 01/27/2016   CL 102 11/15/2015   CO2 25 01/27/2016   Lab Results  Component Value Date   ALT 13 01/27/2016   AST 11 01/27/2016   GGT 39 04/20/2015   ALKPHOS 56 01/27/2016   BILITOT 0.37 01/27/2016     NARRATIVE: David Jacobs was seen today for weekly treatment management. The chart was checked and the patient's films were reviewed.  David Jacobs completes XRT today. He denies any rectal irritation and is voiding without any difficulty. He also denies any fatigue.   PHYSICAL EXAMINATION: height is 5\' 11"  (1.803 m) and weight is 199 lb 3.2 oz (90.357  kg). His temperature is 98.2 F (36.8 C). His blood pressure is 142/62 and his pulse is 67.      Alert. In no acute distress.  ASSESSMENT: The patient is doing satisfactorily with treatment.  PLAN: The patient completed treatment today. He will return for follow up in 1 month.     ------------------------------------------------  Jodelle Gross, MD, PhD  This document serves as a record of services personally performed by Kyung Rudd, MD. It was created on his behalf by Arlyce Harman, a trained medical scribe. The creation of this record is based on the scribe's personal observations and the provider's statements to them. This document has been checked and approved by the attending provider.

## 2016-01-29 NOTE — Progress Notes (Signed)
David Jacobs completes XRT today.  He denies any rectal irritation and is voiding without any difficulty.  He also denies any fatigue.

## 2016-01-29 NOTE — Progress Notes (Signed)
[facility]  [phone] [phone]  Clinic Follow Up Note   Patient Care Team: No Pcp Per Patient as PCP - General (General Practice) 01/29/2016   CHIEF COMPLAINTS:  Follow Up rectal cancer  Oncology History   Rectal cancer [facility])   Staging form: Colon and Rectum, AJCC 7th Edition     Clinical stage from 12/09/2015: Stage IIA (T3, N0, M0) - Signed by Truitt Merle, MD on 12/22/2015       Rectal cancer ([place])   11/23/2015 Initial Diagnosis Rectal cancer ([place])   11/23/2015 Procedure Colonoscopy showed a large fungating ulcerated partially obstructing mass in the proximal rectum. This was biopsied.   11/23/2015 Initial Biopsy Rectal mass biopsy showed adenocarcinoma   11/25/2015 Imaging CT chest, abdomen and pelvis with contrast showed focal wall thickening involving the low rectum, no findings suspicious for metastatic disease.   12/09/2015 Procedure EUS showed a T3 N0 lesion in proximal rectum.   12/22/2015 -  Radiation Therapy New adjuvant irradiation to the rectal cancer   12/22/2015 -  Chemotherapy Neoadjuvant Xeloda 2000mg  in AM, and 1500mg  in PM with concurrent radiation     HISTORY OF PRESENTING ILLNESS (12/07/2015):  David Jacobs 65 y.o. male is here because of His recently diagnosed rectal cancer. He is accompanied by an interpreter and daughter-in-law [name],  to the clinic today.  He has been having rectal bleeding for 3 months, stable overall, small amount, usually once a day, no pain, he otherwise feels well, no fatigue or weight change lately. He was referred by his primary care physician to gastroenterologist Dr. Paulita Fujita, and underwent colonoscopy on 11/23/2015. It showed a fungating ulcerated partially obstructing mass in the proximal rectum, They showed adenocarcinoma. CT scan revealed no evidence of distant metastasis.He was referred to [place] for further management.  He works for the [employer]still works full-time. He livers with his wife, one  of his son and his family. He never had a colonoscopy before he reason one. No family history of colon cancer.    CURRENT THERAPY:  Concurrent radiation and chemotherapy with  Xeloa  2000 mg in the morning and 1500 mg in the evening  INTERIM HISTORY: [name] returns for follow-up. He presents to clinic with a interpreter. Today is his last day of chemotherapy and radiation. He has been tolerating treatment well so far, mild rectal discomfort, does not take pain medication. Denies any other pain, nausea, diarrhea or other symptoms. His appetite and energy level remain well.    MEDICAL HISTORY:  Past Medical History  Diagnosis Date  . Hypertension     SURGICAL HISTORY: Past Surgical History  Procedure Laterality Date  . Eus N/A 12/09/2015    Procedure: LOWER ENDOSCOPIC ULTRASOUND (EUS);  Surgeon: [name], MD;  Location: [facility];  Service: Endoscopy;  Laterality: N/A;    SOCIAL HISTORY: Social History   Social History  . Marital Status: Married    Spouse Name: N/A  . Number of Children: 2  . Years of Education: N/A   Occupational History  . Not on file.   Social History Main Topics  . Smoking status: Current Every Day Smoker -- 0.50 packs/day for 35 years  . Smokeless tobacco: Not on file  . Alcohol Use: 1.2 - 1.8 oz/week    2-3 Cans of beer, 0 Standard drinks or equivalent per week     Comment: a few beers a week   . Drug Use: Not on file  . Sexual Activity: Not on  file   Other Topics Concern  . Not on file   Social History Narrative   Married, lives w/his wife and a son--minimal English   Works full time at [employer]   Current everyday smoker, with no plans to quit   Primary contact is daughter-in-law, Farouk Moulds (can interpret for him)    FAMILY HISTORY: Family History  Problem Relation Age of Onset  . Cancer Mother 42    throid cancer   . Hyperlipidemia Mother   . Diabetes Father     ALLERGIES:  has No Known  Allergies.  MEDICATIONS:  Current Outpatient Prescriptions  Medication Sig Dispense Refill  . amLODipine (NORVASC) 5 MG tablet Take 1 tablet (5 mg total) by mouth daily. 30 tablet 1  . capecitabine (XELODA) 500 MG tablet Take 4 tabs (2000mg ) by mouth in the morning & 3 tabs (1500mg ) by mouth in the evening on the days of radiation only. Take with food. 105 tablet 1  . hydrocortisone cream 1 % Apply 1 application topically 2 (two) times daily. (Patient not taking: Reported on 12/22/2015) 30 g 0  . losartan-hydrochlorothiazide (HYZAAR) 100-25 MG tablet Take 1 tablet by mouth daily.    . ondansetron (ZOFRAN) 8 MG tablet Take 1 tablet (8 mg total) by mouth every 8 (eight) hours as needed for nausea. (Patient not taking: Reported on 01/05/2016) 30 tablet 2   No current facility-administered medications for this visit.    REVIEW OF SYSTEMS:   Constitutional: Denies fevers, chills or abnormal night sweats Eyes: Denies blurriness of vision, double vision or watery eyes Ears, nose, mouth, throat, and face: Denies mucositis or sore throat Respiratory: Denies cough, dyspnea or wheezes Cardiovascular: Denies palpitation, chest discomfort or lower extremity swelling Gastrointestinal:  Denies nausea, heartburn or change in bowel habits Skin: Denies abnormal skin rashes Lymphatics: Denies new lymphadenopathy or easy bruising Neurological:Denies numbness, tingling or new weaknesses Behavioral/Psych: Mood is stable, no new changes  All other systems were reviewed with the patient and are negative.  PHYSICAL EXAMINATION: ECOG PERFORMANCE STATUS: 0 - Asymptomatic  Filed Vitals:   01/29/16 1505  BP: 157/62  Pulse: 72  Temp: 98.3 F (36.8 C)  Resp: 18   Filed Weights   01/29/16 1505  Weight: 198 lb 14.4 oz (90.22 kg)    GENERAL:alert, no distress and comfortable SKIN: skin color, texture, turgor are normal, no rashes or significant lesions EYES: normal, conjunctiva are pink and non-injected,  sclera clear OROPHARYNX:no exudate, no erythema and lips, buccal mucosa, and tongue normal  NECK: supple, thyroid normal size, non-tender, without nodularity LYMPH:  no palpable lymphadenopathy in the cervical, axillary or inguinal LUNGS: clear to auscultation and percussion with normal breathing effort HEART: regular rate & rhythm and no murmurs and no lower extremity edema ABDOMEN:abdomen soft, non-tender and normal bowel sounds, no organomegaly, rectal exam was deferred.  Musculoskeletal:no cyanosis of digits and no clubbing  PSYCH: alert & oriented x 3 with fluent speech NEURO: no focal motor/sensory deficits  LABORATORY DATA:  I have reviewed the data as listed CBC Latest Ref Rng 01/27/2016 01/19/2016 01/12/2016  WBC 4.0 - 10.3 10e3/uL 6.7 7.3 6.3  Hemoglobin 13.0 - 17.1 g/dL 13.8 14.4 14.6  Hematocrit 38.4 - 49.9 % 39.8 42.5 42.7  Platelets 140 - 400 10e3/uL 282 224 238    CMP Latest Ref Rng 01/27/2016 01/19/2016 01/12/2016  Glucose 70 - 140 mg/dl 112 103 101  BUN 7.0 - 26.0 mg/dL 12.6 13.9 16.6  Creatinine 0.7 - 1.3 mg/dL  1.0 1.0 1.2  Sodium 136 - 145 mEq/L 140 138 140  Potassium 3.5 - 5.1 mEq/L 3.9 4.0 4.1  CO2 22 - 29 mEq/L 25 26 27   Calcium 8.4 - 10.4 mg/dL 9.5 9.4 9.5  Total Protein 6.4 - 8.3 g/dL 7.0 6.9 7.1  Total Bilirubin 0.20 - 1.20 mg/dL 0.37 0.46 0.46  Alkaline Phos 40 - 150 U/L 56 59 51  AST 5 - 34 U/L 11 10 14   ALT 0 - 55 U/L 13 17 25      PATHOLOGY REPORT: Rectum biopsy 11/23/2015 -Invasive adenocarcinoma  Microscopic comment Interval Departmental review obtained (Dr. Lyndon Code) with agreement.  RADIOGRAPHIC STUDIES: I have personally reviewed the radiological images as listed and agreed with the findings in the report. No results found. COLONOSCOPY Dr. Paulita Fujita 11/23/2015  -A front-like villous fungating polypoid sessile and ulcerated partially obstructing large mass was found in the proximal rectum. The mass was partially circumferential (involving one half of the  lumen circumference). Bruising was present. This was biopsied. -Non-thrombosed external hemorrhoids -A single nonbleeding clonic angiodysplastic lesion in the hepatic flexure and descending colon  EUS 12/09/2015 - Non-thrombosed external hemorrhoids found on perianal exam. - Malignant partially obstructing tumor in the proximal rectum. - Endosonographic images of the perirectal space were unremarkable. - No lymph nodes were seen in the perirectal region and in a peritumoral location during endosonographic examination. - Rectal mass was visualized endosonographically. A tissue diagnosis was obtained prior to this exam. This is of adenocarcinoma. This was staged uT3 uN0.   ASSESSMENT & PLAN: 65 year old male with past medical history of hypertension, presented with intermittent rectal bleeding for 3 months.   1. Proximal rectal adenocarcinoma, uT3N0M0, stage IIA --I discussed his CT scan finding, colonoscopy and biopsy results in great details with patient and his family members. -his EUS showed a T3 N0 disease -We discussed the risk of cancer recurrence with surgery alone, and multidisciplinary approach is needed for locally advanced disease. -he is on concurrent chemotherapy and irradiation, with Xeloda 2000 mg in the a.m., 1500 mg in the evening, tolerating well overall.  -Lab results reviewed with him, CBC and CMP are within normal limits.  -he is finishing neoadjuvant chemotherapy and radiation today. -He has not scheduled his follow-up appointment with Dr. Marcello Moores yet. He wants to wait until his daughter-in-law returns on July 21. He does not speak [place].  2. HTN -He is losartan-HCTZ, we discussed potential impact of chemotherapy radiation on his blood pressure. -His blood pressure has been normal  3. Smoking and alcohol cessation -He smokes half pack a day,I strongly encouraged him to cut back and quit -I also recommend him not to drink any alcohol when he is on chemotherapy and  radiation. He agrees -He would like to resume smoking and drinking after he completes chemoradiation. He agrees to smoke and drink less though.   Plan  -he will complete chemotherapy and radiation today -l see him back on 8/18  -He will ask his daughter-in-law to call Dr. Manon Hilding office for follow up appointment when she returns on 7/21.    All questions were answered. The patient knows to call the clinic with any problems, questions or concerns. I spent 20 minutes counseling the patient face to face. The total time spent in the appointment was 25 minutes and more than 50% was on counseling.     Truitt Merle, MD 01/29/2016 1:18 PM

## 2016-02-01 ENCOUNTER — Telehealth: Payer: Self-pay | Admitting: Hematology

## 2016-02-01 ENCOUNTER — Ambulatory Visit: Admission: RE | Admit: 2016-02-01 | Payer: No Typology Code available for payment source | Source: Ambulatory Visit

## 2016-02-01 NOTE — Telephone Encounter (Signed)
cld pt and left message of time & date of appt for 8/18@2 

## 2016-02-04 NOTE — Progress Notes (Signed)
°  Radiation Oncology         [phone] ________________________________  Name: David Jacobs MRN: [id number]  Date: 01/29/2016  DOB: Oct 04, 1950  End of Treatment Note  Diagnosis:      ICD-9-CM ICD-10-CM   1. Rectal cancer ([place]) 154.1 C20              Indication for treatment:  Curative       Radiation treatment dates:   12/22/2015 to 01/29/2016  Site/dose:    1. The rectum was treated to 45 Gy in 25 fractions at 1.8 Gy per fraction. 2. The rectum was boosted to 5.4 Gy in 3 fractions at 1.8 Gy per fraction.   Beams/energy:    1. 3D // 15X, 6X 2. Isodose Plan // 15X, 6X  Narrative: The patient tolerated radiation treatment relatively well.  The patient did not experience any rectal irritation or fatigue. He reported voiding without any difficulty.  Plan: The patient has completed radiation treatment. The patient will return to radiation oncology clinic for routine followup in one month. I advised them to call or return sooner if they have any questions or concerns related to their recovery or treatment.  ------------------------------------------------  Jodelle Gross, MD, PhD  This document serves as a record of services personally performed by Kyung Rudd, MD. It was created on his behalf by Arlyce Harman, a trained medical scribe. The creation of this record is based on the scribe's personal observations and the provider's statements to them. This document has been checked and approved by the attending provider.

## 2016-02-13 NOTE — Progress Notes (Signed)
  Radiation Oncology         [phone] ________________________________  Name: David Jacobs MRN: [id number]  Date: 01/21/2016  DOB: 1951/02/12  COMPLEX SIMULATION  NOTE  Diagnosis: rectal cancer  Narrative The patient has initially been planned to receive a course of radiation treatment to a dose of 45 Gy in 25 fractions at 1.8 gray per fraction. The patient will now receive a boost to the high risk target volume for an additional 5.4 Gy. This will be delivered in 3 fractions at 1.8 Gy per fraction and a cone down boost technique will be utilized. To accomplish this, an additional 4 customized blocks have been designed for this purpose. A complex isodose plan is requested to ensure that the high-risk target region receives the appropriate radiation dose and that the nearby normal structures continue to be appropriately spared. The patient's final total dose therefore will be 50.4 Gy.   ________________________________ ------------------------------------------------  Jodelle Gross, MD, PhD

## 2016-02-15 ENCOUNTER — Other Ambulatory Visit: Payer: Self-pay | Admitting: General Surgery

## 2016-02-15 NOTE — H&P (Signed)
The patient is a 65 year old male who presents with colorectal cancer. 65 year old male who underwent colonoscopy with Dr. Paulita Fujita for hematochezia. This showed a partially obstructing ulcerated mass in the proximal rectum. This was tattooed. Biopsies confirmed adenocarcinoma. The patient has had blood in his stools for approximately 1-2 months. He denies any weight loss. He denies any changes in his bowel movements. He denies any abdominal pain. Pt is from [place] and [place]. Official interpreter was offered. Pt declided and wished for his daughter to translate instead.    Other Problems Mammie Lorenzo, LPN; 624THL D34-534 AM) High blood pressure Rectal Cancer  Past Surgical History Mammie Lorenzo, LPN; 624THL [id number] AM) No pertinent past surgical history  Diagnostic Studies History Mammie Lorenzo, LPN; 624THL [id number] AM) Colonoscopy within last year  Allergies Mammie Lorenzo, LPN; 624THL [id number] AM) No Known Drug Allergies05/15/2017  Medication History Mammie Lorenzo, LPN; 624THL 624THL AM) Norvasc (5MG  Tablet, Oral) Active. Losartan Potassium-HCTZ (100-25MG  Tablet, Oral) Active. Medications Reconciled  Social History Mammie Lorenzo, LPN; 624THL D34-534 AM) Alcohol use Occasional alcohol use. Caffeine use Coffee. No drug use Tobacco use Current every day smoker.  Family History Mammie Lorenzo, LPN; 624THL D34-534 AM) Diabetes Mellitus Father. Hypertension Mother. Thyroid problems Mother.    Review of Systems  General Not Present- Appetite Loss, Chills, Fatigue, Fever, Night Sweats, Weight Gain and Weight Loss. Skin Not Present- Change in Wart/Mole, Dryness, Hives, Jaundice, New Lesions, Non-Healing Wounds, Rash and Ulcer. HEENT Not Present- Earache, Hearing Loss, Hoarseness, Nose Bleed, Oral Ulcers, Ringing in the Ears, Seasonal Allergies, Sinus Pain, Sore Throat, Visual Disturbances, Wears glasses/contact lenses and Yellow Eyes. Respiratory Not  Present- Bloody sputum, Chronic Cough, Difficulty Breathing, Snoring and Wheezing. Breast Not Present- Breast Mass, Breast Pain, Nipple Discharge and Skin Changes. Cardiovascular Not Present- Chest Pain, Difficulty Breathing Lying Down, Leg Cramps, Palpitations, Rapid Heart Rate, Shortness of Breath and Swelling of Extremities. Gastrointestinal Present- Bloody Stool. Not Present- Abdominal Pain, Bloating, Change in Bowel Habits, Chronic diarrhea, Constipation, Difficulty Swallowing, Excessive gas, Gets full quickly at meals, Hemorrhoids, Indigestion, Nausea, Rectal Pain and Vomiting. Male Genitourinary Not Present- Blood in Urine, Change in Urinary Stream, Frequency, Impotence, Nocturia, Painful Urination, Urgency and Urine Leakage. Musculoskeletal Not Present- Back Pain, Joint Pain, Joint Stiffness, Muscle Pain, Muscle Weakness and Swelling of Extremities. Neurological Not Present- Decreased Memory, Fainting, Headaches, Numbness, Seizures, Tingling, Tremor, Trouble walking and Weakness. Endocrine Not Present- Cold Intolerance, Excessive Hunger, Hair Changes, Heat Intolerance, Hot flashes and New Diabetes. Hematology Not Present- Easy Bruising, Excessive bleeding, Gland problems, HIV and Persistent Infections.   Weight: 197.2 lb Height: 71in Body Surface Area: 2.1 m Body Mass Index: 27.5 kg/m  Temp.: 98.43F(Oral)  Pulse: 79 (Regular)  BP: 142/74 (Sitting, Left Arm, Standard)       Physical Exam  General Mental Status-Alert. General Appearance-Not in acute distress. Build & Nutrition-Well nourished. Posture-Normal posture. Gait-Normal.  Head and Neck Head-normocephalic, atraumatic with no lesions or palpable masses. Trachea-midline.  Chest and Lung Exam Chest and lung exam reveals -on auscultation, normal breath sounds, no adventitious sounds and normal vocal resonance.  Cardiovascular Cardiovascular examination reveals -normal heart sounds,  regular rate and rhythm with no murmurs and femoral artery auscultation bilaterally reveals normal pulses, no bruits, no thrills.  Abdomen Inspection Inspection of the abdomen reveals - No Hernias. Palpation/Percussion Palpation and Percussion of the abdomen reveal - Soft, Non Tender, No Rigidity (guarding), No hepatosplenomegaly and No Palpable abdominal masses.  Rectal Anorectal Exam Internal - Note: no palpable masses.  Neurologic Neurologic evaluation reveals -alert and oriented x 3 with no impairment of recent or remote memory, normal attention span and ability to concentrate, normal sensation and normal coordination.  Musculoskeletal Normal Exam - Bilateral-Upper Extremity Strength Normal and Lower Extremity Strength Normal.    Assessment & Plan Leighton Ruff MD; [id number] 9:55 AM) RECTAL CANCER (C20) Impression: 65 year old male who presents to the office with a new diagnosis of rectal cancer. He underwent a colonoscopy due to hematochezia. Colonoscopy revealed a fungating polypoid ulcerated partially obstructing mass in the proximal rectum. Biopsy showed invasive adenocarcinoma. The area was tattooed. Rectal ultrasound showed a T3N0 proximal rectal mass. CT scans of chest abdomen and pelvis show the rectal wall thickening but no other signs of metastatic disease. CEA was normal. He has completed his neoadjuvant  treatment and is ready for surgery.   The surgery and anatomy were described to the patient as well as the risks of surgery and the possible complications.  These include: Bleeding, deep abdominal infections and possible wound complications such as hernia and infection, damage to adjacent structures, leak of surgical connections, which can lead to other surgeries and possibly an ostomy, possible need for other procedures, such as abscess drains in radiology, possible prolonged hospital stay, possible diarrhea from removal of part of the colon, possible constipation from  narcotics, possible sexual or bladder dysfunction, prolonged fatigue/weakness or appetite loss, possible early recurrence of of disease, possible complications of their medical problems such as heart disease or arrhythmias or lung problems, death (less than 1%). I believe the patient understands and wishes to proceed with the surgery.

## 2016-03-01 ENCOUNTER — Ambulatory Visit
Admission: RE | Admit: 2016-03-01 | Discharge: 2016-03-01 | Disposition: A | Discharge status: Home / Self Care | Payer: No Typology Code available for payment source | Source: Ambulatory Visit | Attending: Radiation Oncology | Admitting: Radiation Oncology

## 2016-03-01 ENCOUNTER — Encounter: Payer: Self-pay | Admitting: Radiation Oncology

## 2016-03-01 VITALS — BP 134/71 | HR 88 | Temp 98.1°F | Ht 71.0 in | Wt 199.0 lb

## 2016-03-01 DIAGNOSIS — C20 Malignant neoplasm of rectum: Secondary | ICD-10-CM | POA: Diagnosis present

## 2016-03-01 NOTE — Progress Notes (Addendum)
Mr. Rocchi here fro reassessment S/P XRT to the rectum which completed on 01/29/16. He denies any pain in the rectal region, fatigue nor diarrhea.   BP 134/71 (BP Location: Right Arm, Patient Position: Sitting, Cuff Size: Normal)   Pulse 88   Temp 98.1 F (36.7 C)   Ht 5\' 11"  (1.803 m)   Wt 199 lb (90.3 kg)   BMI 27.75 kg/m     Wt Readings from Last 3 Encounters:  03/01/16 199 lb (90.3 kg)  01/29/16 199 lb 3.2 oz (90.4 kg)  01/29/16 198 lb 14.4 oz (90.2 kg)

## 2016-03-01 NOTE — Progress Notes (Addendum)
  Radiation Oncology         [phone]) [phone] ________________________________  Name: David Jacobs MRN: [id number]  Date: 03/01/2016  DOB: September 16, 1950  Follow-Up Visit Note  CC: No PCP Per Patient  [name], MD  Diagnosis:  Stage IIA, T3N0M0 adenocarcinoma of the proximal rectum.  Interval Since Last Radiation: 1 month  12/22/15-01/29/16: 50.4 Gy in total to the rectum in 30 fractions including 3 fractions of boost to the rectum.  Narrative:  In summary this is a very pleasant gentleman who received neoadjuvant chemotherapy and radiation planning to undergo interval surgical resection followed by additional adjuvant chemotherapy. He did well and tolerating his radiation treatment and comes today for his post treatment visit. Overall, he reports that he is doing extremely well. He denies any difficulty with loose bowel movements, rectal pain, abdominal pain, nausea or vomiting. He is eating well. He is not experiencing chest pain, shortness of breath fevers or chills. No other complaints or verbalized.                           ALLERGIES:  has No Known Allergies.  Meds: Current Outpatient Prescriptions  Medication Sig Dispense Refill  . amLODipine (NORVASC) 5 MG tablet Take 1 tablet (5 mg total) by mouth daily. 30 tablet 1  . losartan-hydrochlorothiazide (HYZAAR) 100-25 MG tablet Take 1 tablet by mouth daily.     No current facility-administered medications for this encounter.     Physical Findings:  height is 5\' 11"  (1.803 m) and weight is 199 lb (90.3 kg). His temperature is 98.1 F (36.7 C). His blood pressure is 134/71 and his pulse is 88.  In general this is a well appearing [place] european male in no acute distress. He's alert and oriented x4 and appropriate throughout the examination. Cardiopulmonary assessment is negative for acute distress and he exhibits normal effort.    Lab Findings: Lab Results  Component Value Date   WBC 6.7 01/27/2016   HGB 13.8 01/27/2016   HCT  39.8 01/27/2016   MCV 92.3 01/27/2016   PLT 282 01/27/2016     Radiographic Findings: No results found.  Impression/Plan: 1. Stage IIA, T3N0M0 adenocarcinoma of the proximal rectum. The patient is doing very well overall since completing his radiotherapy. He has plans to proceed now with Dr. Marcello Moores and will be going to the operating room for APR at the end of the month. We will plan to see him back in 6 months for continued evaluation provided that he has completed his remaining chemotherapy and is in surveillance at that time.      Carola Rhine, PAC

## 2016-03-04 ENCOUNTER — Telehealth: Payer: Self-pay | Admitting: *Deleted

## 2016-03-04 NOTE — Telephone Encounter (Signed)
Received message from [facility] needing clarification on pt.  Mickel Baas wanted to know if pt is still under active chemo and/or radiation treatments - in order for her to process disability claim for pt.   Attempted to call Mickel Baas back unsuccessfully.  Left message on voice mail requesting a call back from [facility]. [facility]     Phone       [phone]   Ext   [phone].

## 2016-03-11 ENCOUNTER — Other Ambulatory Visit (HOSPITAL_BASED_OUTPATIENT_CLINIC_OR_DEPARTMENT_OTHER): Payer: PRIVATE HEALTH INSURANCE

## 2016-03-11 ENCOUNTER — Telehealth: Payer: Self-pay | Admitting: Hematology

## 2016-03-11 ENCOUNTER — Ambulatory Visit (HOSPITAL_BASED_OUTPATIENT_CLINIC_OR_DEPARTMENT_OTHER): Payer: PRIVATE HEALTH INSURANCE | Admitting: Hematology

## 2016-03-11 ENCOUNTER — Encounter: Payer: Self-pay | Admitting: Hematology

## 2016-03-11 VITALS — BP 149/58 | HR 76 | Temp 98.0°F | Resp 18 | Ht 71.0 in | Wt 207.4 lb

## 2016-03-11 DIAGNOSIS — Z720 Tobacco use: Secondary | ICD-10-CM | POA: Diagnosis not present

## 2016-03-11 DIAGNOSIS — C20 Malignant neoplasm of rectum: Secondary | ICD-10-CM | POA: Diagnosis not present

## 2016-03-11 DIAGNOSIS — I10 Essential (primary) hypertension: Secondary | ICD-10-CM | POA: Diagnosis not present

## 2016-03-11 LAB — COMPREHENSIVE METABOLIC PANEL
ALT: 17 U/L (ref 0–55)
ANION GAP: 8 meq/L (ref 3–11)
AST: 14 U/L (ref 5–34)
Albumin: 3.9 g/dL (ref 3.5–5.0)
Alkaline Phosphatase: 63 U/L (ref 40–150)
BUN: 16.2 mg/dL (ref 7.0–26.0)
CHLORIDE: 105 meq/L (ref 98–109)
CO2: 26 meq/L (ref 22–29)
CREATININE: 1.1 mg/dL (ref 0.7–1.3)
Calcium: 9.7 mg/dL (ref 8.4–10.4)
EGFR: 70 mL/min/{1.73_m2} — ABNORMAL LOW (ref >90)
Glucose: 98 mg/dl (ref 70–140)
POTASSIUM: 3.9 meq/L (ref 3.5–5.1)
Sodium: 139 mEq/L (ref 136–145)
Total Bilirubin: 0.46 mg/dL (ref 0.20–1.20)
Total Protein: 7.2 g/dL (ref 6.4–8.3)

## 2016-03-11 LAB — CBC WITH DIFFERENTIAL/PLATELET
BASO%: 0.8 % (ref 0.0–2.0)
Basophils Absolute: 0.1 10*3/uL (ref 0.0–0.1)
EOS%: 3.8 % (ref 0.0–7.0)
Eosinophils Absolute: 0.3 10*3/uL (ref 0.0–0.5)
HCT: 45.4 % (ref 38.4–49.9)
HGB: 15.3 g/dL (ref 13.0–17.1)
LYMPH%: 24.6 % (ref 14.0–49.0)
MCH: 31.9 pg (ref 27.2–33.4)
MCHC: 33.7 g/dL (ref 32.0–36.0)
MCV: 94.6 fL (ref 79.3–98.0)
MONO#: 0.6 10*3/uL (ref 0.1–0.9)
MONO%: 6.8 % (ref 0.0–14.0)
NEUT#: 5.5 10*3/uL (ref 1.5–6.5)
NEUT%: 64 % (ref 39.0–75.0)
PLATELETS: 305 10*3/uL (ref 140–400)
RBC: 4.8 10*6/uL (ref 4.20–5.82)
RDW: 18.3 % — ABNORMAL HIGH (ref 11.0–14.6)
WBC: 8.5 10*3/uL (ref 4.0–10.3)
lymph#: 2.1 10*3/uL (ref 0.9–3.3)

## 2016-03-11 NOTE — Telephone Encounter (Signed)
Gave pt cal & avs °

## 2016-03-11 NOTE — Progress Notes (Signed)
David Jacobs  Telephone:([phone]) [phone] Fax:(336) [phone]  Clinic Follow Up Note   Patient Care Team: No Pcp Per Patient as PCP - General (General Practice) David Ruff, MD as Consulting Physician (General Surgery) 03/11/2016   CHIEF COMPLAINTS:  Follow Up rectal cancer  Oncology History   Rectal cancer [facility])   Staging form: Colon and Rectum, AJCC 7th Edition     Clinical stage from 12/09/2015: Stage IIA (T3, N0, M0) - Signed by David Merle, MD on 12/22/2015       Rectal cancer ([place])   11/23/2015 Initial Diagnosis    Rectal cancer ([place])      11/23/2015 Procedure    Colonoscopy showed a large fungating ulcerated partially obstructing mass in the proximal rectum. This was biopsied.      11/23/2015 Initial Biopsy    Rectal mass biopsy showed adenocarcinoma      11/25/2015 Imaging    CT chest, abdomen and pelvis with contrast showed focal wall thickening involving the low rectum, no findings suspicious for metastatic disease.      12/09/2015 Procedure    EUS showed a T3 N0 lesion in proximal rectum.      12/22/2015 - 02/01/2016 Radiation Therapy    New adjuvant irradiation to the rectal cancer      12/22/2015 - 02/01/2016 Chemotherapy    Neoadjuvant Xeloda 2000mg  in AM, and 1500mg  in PM with concurrent radiation        HISTORY OF PRESENTING ILLNESS (12/07/2015):  David Jacobs 65 y.o. male is here because of His recently diagnosed rectal cancer. He is accompanied by an interpreter and daughter-in-law David Jacobs,  to the clinic today.  He has been having rectal bleeding for 3 months, stable overall, small amount, usually once a day, no pain, he otherwise feels well, no fatigue or weight change lately. He was referred by his primary care physician to gastroenterologist Dr. Paulita Jacobs, and underwent colonoscopy on 11/23/2015. It showed a fungating ulcerated partially obstructing mass in the proximal rectum, They showed adenocarcinoma. CT scan revealed no evidence of distant  metastasis.He was referred to [place] for further management.  He works for the [employer]still works full-time. He livers with his wife, one of his son and his family. He never had a colonoscopy before he reason one. No family history of colon cancer.    CURRENT THERAPY:  Pending rectal cancer surgery   INTERIM HISTORY: David Jacobs returns for follow-up. He presents to clinic with a interpreter. Completed a concurrent chemoirradiation about 6 weeks ago. He is doing very well, denies any rectal discomfort or pain, no hematochezia, no abdominal discomfort or other symptoms. He has good appetite and energy level. He has gained about 10 pounds since he completed his treatment. He hasn't seen David Jacobs back, and is scheduled to have his rectal surgery on September 1.   MEDICAL HISTORY:  Past Medical History:  Diagnosis Date  . Hypertension     SURGICAL HISTORY: Past Surgical History:  Procedure Laterality Date  . EUS N/A 12/09/2015   Procedure: LOWER ENDOSCOPIC ULTRASOUND (EUS);  Surgeon: David Silence, MD;  Location: [facility];  Service: Endoscopy;  Laterality: N/A;    SOCIAL HISTORY: Social History   Social History  . Marital status: Married    Spouse name: N/A  . Number of children: 2  . Years of education: N/A   Occupational History  . Not on file.   Social History Main Topics  . Smoking status: Current Every Day Smoker    Packs/day:  0.50    Years: 35.00  . Smokeless tobacco: Never Used  . Alcohol use 1.2 - 1.8 oz/week    2 - 3 Cans of beer per week     Comment: a few beers a week   . Drug use: Unknown  . Sexual activity: Not on file   Other Topics Concern  . Not on file   Social History Narrative   Married, lives w/his wife and a son--minimal English   Works full time at [employer]   Current everyday smoker, with no plans to quit   Primary contact is daughter-in-law, David Jacobs (can interpret for him)    FAMILY HISTORY: Family History    Problem Relation Age of Onset  . Cancer Mother 21    throid cancer   . Hyperlipidemia Mother   . Diabetes Father     ALLERGIES:  has No Known Allergies.  MEDICATIONS:  Current Outpatient Prescriptions  Medication Sig Dispense Refill  . losartan-hydrochlorothiazide (HYZAAR) 100-25 MG tablet Take 1 tablet by mouth daily.    Marland Kitchen amLODipine (NORVASC) 5 MG tablet Take 1 tablet (5 mg total) by mouth daily. (Patient not taking: Reported on 03/11/2016) 30 tablet 1   No current facility-administered medications for this visit.     REVIEW OF SYSTEMS:   Constitutional: Denies fevers, chills or abnormal night sweats Eyes: Denies blurriness of vision, double vision or watery eyes Ears, nose, mouth, throat, and face: Denies mucositis or sore throat Respiratory: Denies cough, dyspnea or wheezes Cardiovascular: Denies palpitation, chest discomfort or lower extremity swelling Gastrointestinal:  Denies nausea, heartburn or change in bowel habits Skin: Denies abnormal skin rashes Lymphatics: Denies new lymphadenopathy or easy bruising Neurological:Denies numbness, tingling or new weaknesses Behavioral/Psych: Mood is stable, no new changes  All other systems were reviewed with the patient and are negative.  PHYSICAL EXAMINATION: ECOG PERFORMANCE STATUS: 0 - Asymptomatic  Vitals:   03/11/16 1433  BP: (!) 149/58  Pulse: 76  Resp: 18  Temp: 98 F (36.7 C)   Filed Weights   03/11/16 1433  Weight: 207 lb 6.4 oz (94.1 kg)    GENERAL:alert, no distress and comfortable SKIN: skin color, texture, turgor are normal, no rashes or significant lesions EYES: normal, conjunctiva are pink and non-injected, sclera clear OROPHARYNX:no exudate, no erythema and lips, buccal mucosa, and tongue normal  NECK: supple, thyroid normal size, non-tender, without nodularity LYMPH:  no palpable lymphadenopathy in the cervical, axillary or inguinal LUNGS: clear to auscultation and percussion with normal breathing  effort HEART: regular rate & rhythm and no murmurs and no lower extremity edema ABDOMEN:abdomen soft, non-tender and normal bowel sounds, no organomegaly, rectal exam was deferred.  Musculoskeletal:no cyanosis of digits and no clubbing  PSYCH: alert & oriented x 3 with fluent speech NEURO: no focal motor/sensory deficits  LABORATORY DATA:  I have reviewed the data as listed CBC Latest Ref Rng & Units 03/11/2016 01/27/2016 01/19/2016  WBC 4.0 - 10.3 10e3/uL 8.5 6.7 7.3  Hemoglobin 13.0 - 17.1 g/dL 15.3 13.8 14.4  Hematocrit 38.4 - 49.9 % 45.4 39.8 42.5  Platelets 140 - 400 10e3/uL 305 282 224    CMP Latest Ref Rng & Units 03/11/2016 01/27/2016 01/19/2016  Glucose 70 - 140 mg/dl 98 112 103  BUN 7.0 - 26.0 mg/dL 16.2 12.6 13.9  Creatinine 0.7 - 1.3 mg/dL 1.1 1.0 1.0  Sodium 136 - 145 mEq/L 139 140 138  Potassium 3.5 - 5.1 mEq/L 3.9 3.9 4.0  Chloride 98 - 110 mmol/L - - -  CO2 22 - 29 mEq/L 26 25 26   Calcium 8.4 - 10.4 mg/dL 9.7 9.5 9.4  Total Protein 6.4 - 8.3 g/dL 7.2 7.0 6.9  Total Bilirubin 0.20 - 1.20 mg/dL 0.46 0.37 0.46  Alkaline Phos 40 - 150 U/L 63 56 59  AST 5 - 34 U/L 14 11 10   ALT 0 - 55 U/L 17 13 17      PATHOLOGY REPORT: Rectum biopsy 11/23/2015 -Invasive adenocarcinoma  Microscopic comment Interval Departmental review obtained (Dr. Lyndon Code) with agreement.  RADIOGRAPHIC STUDIES: I have personally reviewed the radiological images as listed and agreed with the findings in the report. No results found. COLONOSCOPY Dr. Paulita Jacobs 11/23/2015  -A front-like villous fungating polypoid sessile and ulcerated partially obstructing large mass was found in the proximal rectum. The mass was partially circumferential (involving one half of the lumen circumference). Bruising was present. This was biopsied. -Non-thrombosed external hemorrhoids -A single nonbleeding clonic angiodysplastic lesion in the hepatic flexure and descending colon  EUS 12/09/2015 - Non-thrombosed external hemorrhoids  found on perianal exam. - Malignant partially obstructing tumor in the proximal rectum. - Endosonographic images of the perirectal space were unremarkable. - No lymph nodes were seen in the perirectal region and in a peritumoral location during endosonographic examination. - Rectal mass was visualized endosonographically. A tissue diagnosis was obtained prior to this exam. This is of adenocarcinoma. This was staged uT3 uN0.   ASSESSMENT & PLAN: 65 year old male with past medical history of hypertension, presented with intermittent rectal bleeding for 3 months.   1. Proximal rectal adenocarcinoma, uT3N0M0, stage IIA --I discussed his CT scan finding, colonoscopy and biopsy results in great details with patient and his family members. -his EUS showed a T3 N0 disease -We discussed the risk of cancer recurrence with surgery alone, and multidisciplinary approach is needed for locally advanced disease. -he completed concurrent chemotherapy and irradiation, with Xeloda 2000 mg in the a.m., 1500 mg in the evening, tolerated well overall.  -Lab results reviewed with him, CBC and CMP are within normal limits.  -He is scheduled for his rectal surgery on September 1, and he will see David Jacobs back on September 26. -We discussed the role of adjuvant chemotherapy. Depending on his residual disease, we'll decide if he will need Xeloda alone versus FOLFOX or CAPOX -We also discussed cancer surveillance after he completes all planned treatment.  2. HTN -Continue medication and follow up with primary care physician -His blood pressure has been normal  3. Smoking and alcohol cessation -He smokes half pack a day,I strongly encouraged him to cut back and quit -I also recommend him not to drink any alcohol when he is on chemotherapy and radiation. He agrees -He has resumed smoking and drinking after he completes chemoradiation.   Plan  -surgery on 9/1, an follow-up with David Jacobs on September 26 -I'll  see him back on September 28, to finalize his adjuvant chemotherapy    All questions were answered. The patient knows to call the clinic with any problems, questions or concerns. I spent 20 minutes counseling the patient face to face. The total time spent in the appointment was 25 minutes and more than 50% was on counseling.     David Merle, MD 03/11/2016 3:08 PM

## 2016-03-11 NOTE — Addendum Note (Signed)
Encounter addended by: Benn Moulder, RN on: 03/11/2016  7:53 PM<BR>    Actions taken: Charge Capture section accepted

## 2016-03-12 LAB — CEA: CEA: 2.2 ng/mL (ref 0.0–4.7)

## 2016-03-14 LAB — IRON AND TIBC
%SAT: 34 % (ref 20–55)
IRON: 92 ug/dL (ref 42–163)
TIBC: 271 ug/dL (ref 202–409)
UIBC: 179 ug/dL (ref 117–376)

## 2016-03-14 LAB — CEA (IN HOUSE-CHCC): CEA (CHCC-In House): 2.48 ng/mL (ref 0.00–5.00)

## 2016-03-14 LAB — FERRITIN: FERRITIN: 185 ng/mL (ref 22–316)

## 2016-03-17 NOTE — Patient Instructions (Addendum)
David Jacobs  03/17/2016   Your procedure is scheduled on: 03/25/2016    Report to [facility]  Entrance take [place]  elevators to 3rd floor to  [place] at   [employer]   AM.  Call this number if you have problems the morning of surgery [phone]   Remember: ONLY 1 PERSON MAY GO WITH YOU TO SHORT STAY TO GET  READY MORNING OF [place].  Do not eat food or drink liquids :After Midnight.             Follow Bowel Prep Instructions per MD.               Drink plenty of clear liquids on day of bowel prep to prevent dehydration.      Take these medicines the morning of surgery with A SIP OF WATER: Amlodipine ( Norvasc)                                You may not have any metal on your body including hair pins and              piercings  Do not wear jewelry,  lotions, powders or perfumes, deodorant                       Men may shave face and neck.   Do not bring valuables to the hospital. [place].  Contacts, dentures or bridgework may not be worn into surgery.  Leave suitcase in the car. After surgery it may be brought to your room.        Special Instructions:               Please read over the following fact sheets you were given: _____________________________________________________________________                CLEAR LIQUID DIET   Foods Allowed                                                                     Foods Excluded  Coffee and tea, regular and decaf                             liquids that you cannot  Plain Jell-O in any flavor                                             see through such as: Fruit ices (not with fruit pulp)                                     milk, soups, orange juice  Iced Popsicles  All solid food Carbonated beverages, regular and diet                                    Cranberry, grape and apple juices Sports drinks  like Gatorade Lightly seasoned clear broth or consume(fat free) Sugar, honey syrup  Sample Menu Breakfast                                Lunch                                     Supper Cranberry juice                    Beef broth                            Chicken broth Jell-O                                     Grape juice                           Apple juice Coffee or tea                        Jell-O                                      Popsicle                                                Coffee or tea                        Coffee or tea  _____________________________________________________________________  [facility] - Preparing for Surgery Before surgery, you can play an important role.  Because skin is not sterile, your skin needs to be as free of germs as possible.  You can reduce the number of germs on your skin by washing with CHG (chlorahexidine gluconate) soap before surgery.  CHG is an antiseptic cleaner which kills germs and bonds with the skin to continue killing germs even after washing. Please DO NOT use if you have an allergy to CHG or antibacterial soaps.  If your skin becomes reddened/irritated stop using the CHG and inform your nurse when you arrive at Short Stay. Do not shave (including legs and underarms) for at least 48 hours prior to the first CHG shower.  You may shave your face/neck. Please follow these instructions carefully:  1.  Shower with CHG Soap the night before surgery and the  morning of Surgery.  2.  If you choose to wash your hair, wash your hair first as usual with your  normal  shampoo.  3.  After you shampoo, rinse your hair and body thoroughly to remove the  shampoo.  4.  Use CHG as you would any other liquid soap.  You can apply chg directly  to the skin and wash                       Gently with a scrungie or clean washcloth.  5.  Apply the CHG Soap to your body ONLY FROM THE NECK DOWN.   Do not use on face/ open                            Wound or open sores. Avoid contact with eyes, ears mouth and genitals (private parts).                       Wash face,  Genitals (private parts) with your normal soap.             6.  Wash thoroughly, paying special attention to the area where your surgery  will be performed.  7.  Thoroughly rinse your body with warm water from the neck down.  8.  DO NOT shower/wash with your normal soap after using and rinsing off  the CHG Soap.                9.  Pat yourself dry with a clean towel.            10.  Wear clean pajamas.            11.  Place clean sheets on your bed the night of your first shower and do not  sleep with pets. Day of Surgery : Do not apply any lotions/deodorants the morning of surgery.  Please wear clean clothes to the hospital/surgery center.  FAILURE TO FOLLOW THESE INSTRUCTIONS MAY RESULT IN THE CANCELLATION OF YOUR SURGERY PATIENT SIGNATURE_________________________________  NURSE SIGNATURE__________________________________  ________________________________________________________________________  WHAT IS A BLOOD TRANSFUSION? Blood Transfusion Information  A transfusion is the replacement of blood or some of its parts. Blood is made up of multiple cells which provide different functions.  Red blood cells carry oxygen and are used for blood loss replacement.  White blood cells fight against infection.  Platelets control bleeding.  Plasma helps clot blood.  Other blood products are available for specialized needs, such as hemophilia or other clotting disorders. BEFORE THE TRANSFUSION  Who gives blood for transfusions?   Healthy volunteers who are fully evaluated to make sure their blood is safe. This is blood bank blood. Transfusion therapy is the safest it has ever been in the practice of medicine. Before blood is taken from a donor, a complete history is taken to make sure that person has no history of diseases nor engages in risky social  behavior (examples are intravenous drug use or sexual activity with multiple partners). The donor's travel history is screened to minimize risk of transmitting infections, such as malaria. The donated blood is tested for signs of infectious diseases, such as HIV and hepatitis. The blood is then tested to be sure it is compatible with you in order to minimize the chance of a transfusion reaction. If you or a relative donates blood, this is often done in anticipation of surgery and is not appropriate for emergency situations. It takes many days to process the donated blood. RISKS AND COMPLICATIONS Although transfusion therapy is very safe and saves many lives, the main dangers of transfusion include:   Getting an infectious disease.  Developing a transfusion reaction.  This is an allergic reaction to something in the blood you were given. Every precaution is taken to prevent this. The decision to have a blood transfusion has been considered carefully by your caregiver before blood is given. Blood is not given unless the benefits outweigh the risks. AFTER THE TRANSFUSION  Right after receiving a blood transfusion, you will usually feel much better and more energetic. This is especially true if your red blood cells have gotten low (anemic). The transfusion raises the level of the red blood cells which carry oxygen, and this usually causes an energy increase.  The nurse administering the transfusion will monitor you carefully for complications. HOME CARE INSTRUCTIONS  No special instructions are needed after a transfusion. You may find your energy is better. Speak with your caregiver about any limitations on activity for underlying diseases you may have. SEEK MEDICAL CARE IF:   Your condition is not improving after your transfusion.  You develop redness or irritation at the intravenous (IV) site. SEEK IMMEDIATE MEDICAL CARE IF:  Any of the following symptoms occur over the next 12 hours:  Shaking  chills.  You have a temperature by mouth above 102 F (38.9 C), not controlled by medicine.  Chest, back, or muscle pain.  People around you feel you are not acting correctly or are confused.  Shortness of breath or difficulty breathing.  Dizziness and fainting.  You get a rash or develop hives.  You have a decrease in urine output.  Your urine turns a dark color or changes to pink, red, or brown. Any of the following symptoms occur over the next 10 days:  You have a temperature by mouth above 102 F (38.9 C), not controlled by medicine.  Shortness of breath.  Weakness after normal activity.  The white part of the eye turns yellow (jaundice).  You have a decrease in the amount of urine or are urinating less often.  Your urine turns a dark color or changes to pink, red, or brown. Document Released: 07/08/2000 Document Revised: 10/03/2011 Document Reviewed: 02/25/2008 [facility] Patient Information 2014 [place], [place].  _______________________________________________________________________

## 2016-03-21 ENCOUNTER — Encounter (HOSPITAL_COMMUNITY)
Admission: RE | Admit: 2016-03-21 | Discharge: 2016-03-21 | Disposition: A | Discharge status: Home / Self Care | Payer: PRIVATE HEALTH INSURANCE | Source: Ambulatory Visit | Attending: General Surgery | Admitting: General Surgery

## 2016-03-21 ENCOUNTER — Encounter (HOSPITAL_COMMUNITY): Payer: Self-pay

## 2016-03-21 DIAGNOSIS — Z01812 Encounter for preprocedural laboratory examination: Secondary | ICD-10-CM | POA: Diagnosis present

## 2016-03-21 DIAGNOSIS — Z01810 Encounter for preprocedural cardiovascular examination: Secondary | ICD-10-CM | POA: Insufficient documentation

## 2016-03-21 DIAGNOSIS — I10 Essential (primary) hypertension: Secondary | ICD-10-CM | POA: Diagnosis not present

## 2016-03-21 LAB — CBC
HCT: 44.9 % (ref 39.0–52.0)
HEMOGLOBIN: 15.6 g/dL (ref 13.0–17.0)
MCH: 32.6 pg (ref 26.0–34.0)
MCHC: 34.7 g/dL (ref 30.0–36.0)
MCV: 93.9 fL (ref 78.0–100.0)
Platelets: 323 10*3/uL (ref 150–400)
RBC: 4.78 MIL/uL (ref 4.22–5.81)
RDW: 16 % — AB (ref 11.5–15.5)
WBC: 8.2 10*3/uL (ref 4.0–10.5)

## 2016-03-21 LAB — BASIC METABOLIC PANEL
ANION GAP: 6 (ref 5–15)
BUN: 16 mg/dL (ref 6–20)
CALCIUM: 9.8 mg/dL (ref 8.9–10.3)
CHLORIDE: 103 mmol/L (ref 101–111)
CO2: 29 mmol/L (ref 22–32)
CREATININE: 1.03 mg/dL (ref 0.61–1.24)
Glucose, Bld: 108 mg/dL — ABNORMAL HIGH (ref 65–99)
POTASSIUM: 3.8 mmol/L (ref 3.5–5.1)
SODIUM: 138 mmol/L (ref 135–145)

## 2016-03-21 LAB — ABO/RH: ABO/RH(D): A POS

## 2016-03-21 NOTE — Progress Notes (Signed)
Final EKG done 03/21/16 on chart

## 2016-03-22 LAB — HEMOGLOBIN A1C
HEMOGLOBIN A1C: 5.7 % — AB (ref 4.8–5.6)
Mean Plasma Glucose: 117 mg/dL

## 2016-03-24 NOTE — Anesthesia Preprocedure Evaluation (Addendum)
Anesthesia Evaluation  Patient identified by MRN, date of birth, ID band Patient awake    Reviewed: Allergy & Precautions, H&P , Patient's Chart, lab work & pertinent test results, reviewed documented beta blocker date and time   Airway Mallampati: II  TM Distance: >3 FB Neck ROM: full    Dental no notable dental hx.    Pulmonary Current Smoker,    Pulmonary exam normal breath sounds clear to auscultation       Cardiovascular hypertension, On Medications  Rhythm:regular Rate:Normal     Neuro/Psych    GI/Hepatic   Endo/Other    Renal/GU      Musculoskeletal   Abdominal   Peds  Hematology   Anesthesia Other Findings   Reproductive/Obstetrics                            Anesthesia Physical Anesthesia Plan  ASA: II  Anesthesia Plan: General   Post-op Pain Management:    Induction: Intravenous  Airway Management Planned: Oral ETT  Additional Equipment:   Intra-op Plan:   Post-operative Plan: Extubation in OR  Informed Consent: I have reviewed the patients History and Physical, chart, labs and discussed the procedure including the risks, benefits and alternatives for the proposed anesthesia with the patient or authorized representative who has indicated his/her understanding and acceptance.   Dental Advisory Given and Dental advisory given  Plan Discussed with: CRNA and Surgeon  Anesthesia Plan Comments: (  Discussed general anesthesia, including possible nausea, instrumentation of airway, sore throat,pulmonary aspiration, etc. I asked if the were any outstanding questions, or  concerns before we proceeded. )        Anesthesia Quick Evaluation

## 2016-03-25 ENCOUNTER — Inpatient Hospital Stay (HOSPITAL_COMMUNITY): Payer: No Typology Code available for payment source | Admitting: Anesthesiology

## 2016-03-25 ENCOUNTER — Encounter (HOSPITAL_COMMUNITY)
Admission: RE | Disposition: A | Discharge status: Home / Self Care | Payer: Self-pay | Source: Ambulatory Visit | Attending: General Surgery

## 2016-03-25 ENCOUNTER — Inpatient Hospital Stay (HOSPITAL_COMMUNITY)
Admission: RE | Admit: 2016-03-25 | Discharge: 2016-03-29 | DRG: 330 | Disposition: A | Discharge status: Home / Self Care | Payer: No Typology Code available for payment source | Source: Ambulatory Visit | Attending: General Surgery | Admitting: General Surgery

## 2016-03-25 ENCOUNTER — Encounter (HOSPITAL_COMMUNITY): Payer: Self-pay

## 2016-03-25 DIAGNOSIS — Z79899 Other long term (current) drug therapy: Secondary | ICD-10-CM

## 2016-03-25 DIAGNOSIS — F17200 Nicotine dependence, unspecified, uncomplicated: Secondary | ICD-10-CM | POA: Diagnosis present

## 2016-03-25 DIAGNOSIS — Z8249 Family history of ischemic heart disease and other diseases of the circulatory system: Secondary | ICD-10-CM

## 2016-03-25 DIAGNOSIS — Z833 Family history of diabetes mellitus: Secondary | ICD-10-CM | POA: Diagnosis not present

## 2016-03-25 DIAGNOSIS — K921 Melena: Secondary | ICD-10-CM | POA: Diagnosis present

## 2016-03-25 DIAGNOSIS — C20 Malignant neoplasm of rectum: Secondary | ICD-10-CM | POA: Diagnosis present

## 2016-03-25 DIAGNOSIS — I10 Essential (primary) hypertension: Secondary | ICD-10-CM | POA: Diagnosis present

## 2016-03-25 HISTORY — PX: XI ROBOTIC ASSISTED LOWER ANTERIOR RESECTION: SHX6558

## 2016-03-25 LAB — TYPE AND SCREEN
ABO/RH(D): A POS
ANTIBODY SCREEN: NEGATIVE

## 2016-03-25 SURGERY — RESECTION, RECTUM, LOW ANTERIOR, ROBOT-ASSISTED
Anesthesia: General

## 2016-03-25 MED ORDER — ENOXAPARIN SODIUM 40 MG/0.4ML ~~LOC~~ SOLN
40.0000 mg | SUBCUTANEOUS | Status: DC
  Administered 2016-03-26 – 2016-03-29 (×4): 40 mg via SUBCUTANEOUS
  Filled 2016-03-25 (×4): qty 0.4

## 2016-03-25 MED ORDER — MIDAZOLAM HCL 5 MG/5ML IJ SOLN
INTRAMUSCULAR | Status: DC | PRN
  Administered 2016-03-25: 2 mg via INTRAVENOUS

## 2016-03-25 MED ORDER — ALBUMIN HUMAN 5 % IV SOLN
INTRAVENOUS | Status: AC
  Filled 2016-03-25: qty 250

## 2016-03-25 MED ORDER — LACTATED RINGERS IR SOLN
Status: DC | PRN
  Administered 2016-03-25: 1000 mL

## 2016-03-25 MED ORDER — AMLODIPINE BESYLATE 5 MG PO TABS
5.0000 mg | ORAL_TABLET | Freq: Every day | ORAL | Status: DC
  Administered 2016-03-26 – 2016-03-29 (×4): 5 mg via ORAL
  Filled 2016-03-25 (×4): qty 1

## 2016-03-25 MED ORDER — HYDROMORPHONE HCL 1 MG/ML IJ SOLN
0.2500 mg | INTRAMUSCULAR | Status: DC | PRN
  Administered 2016-03-25 (×2): 0.5 mg via INTRAVENOUS

## 2016-03-25 MED ORDER — PROPOFOL 10 MG/ML IV BOLUS
INTRAVENOUS | Status: DC | PRN
  Administered 2016-03-25: 180 mg via INTRAVENOUS

## 2016-03-25 MED ORDER — ROCURONIUM BROMIDE 100 MG/10ML IV SOLN
INTRAVENOUS | Status: AC
  Filled 2016-03-25: qty 2

## 2016-03-25 MED ORDER — ALUM & MAG HYDROXIDE-SIMETH 200-200-20 MG/5ML PO SUSP: 30.0000 mL | Freq: Four times a day (QID) | ORAL | Status: DC | PRN

## 2016-03-25 MED ORDER — GABAPENTIN 300 MG PO CAPS
300.0000 mg | ORAL_CAPSULE | ORAL | Status: AC
  Administered 2016-03-25: 300 mg via ORAL
  Filled 2016-03-25: qty 1

## 2016-03-25 MED ORDER — LOSARTAN POTASSIUM-HCTZ 100-25 MG PO TABS: 1.0000 | ORAL_TABLET | Freq: Every day | ORAL | Status: DC

## 2016-03-25 MED ORDER — DIPHENHYDRAMINE HCL 12.5 MG/5ML PO ELIX: 12.5000 mg | ORAL_SOLUTION | Freq: Four times a day (QID) | ORAL | Status: DC | PRN

## 2016-03-25 MED ORDER — FENTANYL CITRATE (PF) 250 MCG/5ML IJ SOLN
INTRAMUSCULAR | Status: AC
  Filled 2016-03-25: qty 5

## 2016-03-25 MED ORDER — ONDANSETRON HCL 4 MG/2ML IJ SOLN: 4.0000 mg | Freq: Four times a day (QID) | INTRAMUSCULAR | Status: DC | PRN

## 2016-03-25 MED ORDER — ONDANSETRON HCL 4 MG PO TABS: 4.0000 mg | ORAL_TABLET | Freq: Four times a day (QID) | ORAL | Status: DC | PRN

## 2016-03-25 MED ORDER — LIDOCAINE 2% (20 MG/ML) 5 ML SYRINGE
INTRAMUSCULAR | Status: DC | PRN
  Administered 2016-03-25: 50 mg via INTRAVENOUS

## 2016-03-25 MED ORDER — CEFOTETAN DISODIUM-DEXTROSE 2-2.08 GM-% IV SOLR
INTRAVENOUS | Status: AC
  Filled 2016-03-25: qty 50

## 2016-03-25 MED ORDER — DIPHENHYDRAMINE HCL 50 MG/ML IJ SOLN: 12.5000 mg | Freq: Four times a day (QID) | INTRAMUSCULAR | Status: DC | PRN

## 2016-03-25 MED ORDER — PROPOFOL 10 MG/ML IV BOLUS
INTRAVENOUS | Status: AC
  Filled 2016-03-25: qty 20

## 2016-03-25 MED ORDER — LOSARTAN POTASSIUM 50 MG PO TABS
100.0000 mg | ORAL_TABLET | Freq: Every day | ORAL | Status: DC
  Administered 2016-03-26 – 2016-03-29 (×4): 100 mg via ORAL
  Filled 2016-03-25 (×4): qty 2

## 2016-03-25 MED ORDER — HYDROMORPHONE HCL 1 MG/ML IJ SOLN
INTRAMUSCULAR | Status: AC
  Filled 2016-03-25: qty 1

## 2016-03-25 MED ORDER — DEXTROSE 5 % IV SOLN
2.0000 g | INTRAVENOUS | Status: AC
  Administered 2016-03-25: 2 g via INTRAVENOUS
  Filled 2016-03-25: qty 2

## 2016-03-25 MED ORDER — ONDANSETRON HCL 4 MG/2ML IJ SOLN
INTRAMUSCULAR | Status: DC | PRN
  Administered 2016-03-25: 4 mg via INTRAVENOUS

## 2016-03-25 MED ORDER — HYDROCHLOROTHIAZIDE 25 MG PO TABS
25.0000 mg | ORAL_TABLET | Freq: Every day | ORAL | Status: DC
  Administered 2016-03-26 – 2016-03-29 (×4): 25 mg via ORAL
  Filled 2016-03-25 (×4): qty 1

## 2016-03-25 MED ORDER — MORPHINE SULFATE (PF) 2 MG/ML IV SOLN
2.0000 mg | INTRAVENOUS | Status: DC | PRN
  Administered 2016-03-26 – 2016-03-27 (×3): 2 mg via INTRAVENOUS
  Filled 2016-03-25 (×3): qty 1

## 2016-03-25 MED ORDER — LIDOCAINE 2% (20 MG/ML) 5 ML SYRINGE
INTRAMUSCULAR | Status: AC
  Filled 2016-03-25: qty 5

## 2016-03-25 MED ORDER — EPHEDRINE 5 MG/ML INJ
INTRAVENOUS | Status: AC
  Filled 2016-03-25: qty 10

## 2016-03-25 MED ORDER — HYDROMORPHONE HCL 1 MG/ML IJ SOLN
INTRAMUSCULAR | Status: DC | PRN
  Administered 2016-03-25 (×3): 0.5 mg via INTRAVENOUS

## 2016-03-25 MED ORDER — ALVIMOPAN 12 MG PO CAPS
12.0000 mg | ORAL_CAPSULE | Freq: Two times a day (BID) | ORAL | Status: DC
  Administered 2016-03-26 – 2016-03-29 (×7): 12 mg via ORAL
  Filled 2016-03-25 (×7): qty 1

## 2016-03-25 MED ORDER — HEPARIN SODIUM (PORCINE) 5000 UNIT/ML IJ SOLN
5000.0000 [IU] | Freq: Once | INTRAMUSCULAR | Status: AC
  Administered 2016-03-25: 5000 [IU] via SUBCUTANEOUS
  Filled 2016-03-25: qty 1

## 2016-03-25 MED ORDER — ALVIMOPAN 12 MG PO CAPS
12.0000 mg | ORAL_CAPSULE | Freq: Once | ORAL | Status: AC
  Administered 2016-03-25: 12 mg via ORAL
  Filled 2016-03-25: qty 1

## 2016-03-25 MED ORDER — 0.9 % SODIUM CHLORIDE (POUR BTL) OPTIME
TOPICAL | Status: DC | PRN
  Administered 2016-03-25: 2000 mL

## 2016-03-25 MED ORDER — MIDAZOLAM HCL 2 MG/2ML IJ SOLN
INTRAMUSCULAR | Status: AC
  Filled 2016-03-25: qty 2

## 2016-03-25 MED ORDER — LACTATED RINGERS IV SOLN
INTRAVENOUS | Status: DC | PRN
  Administered 2016-03-25 (×3): via INTRAVENOUS

## 2016-03-25 MED ORDER — SUGAMMADEX SODIUM 200 MG/2ML IV SOLN
INTRAVENOUS | Status: DC | PRN
  Administered 2016-03-25: 190 mg via INTRAVENOUS

## 2016-03-25 MED ORDER — BUPIVACAINE-EPINEPHRINE 0.25% -1:200000 IJ SOLN
INTRAMUSCULAR | Status: AC
  Filled 2016-03-25: qty 1

## 2016-03-25 MED ORDER — ROCURONIUM BROMIDE 10 MG/ML (PF) SYRINGE
PREFILLED_SYRINGE | INTRAVENOUS | Status: DC | PRN
  Administered 2016-03-25: 20 mg via INTRAVENOUS
  Administered 2016-03-25: 10 mg via INTRAVENOUS
  Administered 2016-03-25 (×2): 20 mg via INTRAVENOUS
  Administered 2016-03-25: 70 mg via INTRAVENOUS

## 2016-03-25 MED ORDER — DEXTROSE 5 % IV SOLN
2.0000 g | Freq: Two times a day (BID) | INTRAVENOUS | Status: AC
  Administered 2016-03-25: 2 g via INTRAVENOUS
  Filled 2016-03-25: qty 2

## 2016-03-25 MED ORDER — KCL IN DEXTROSE-NACL 20-5-0.45 MEQ/L-%-% IV SOLN
INTRAVENOUS | Status: DC
  Administered 2016-03-26 – 2016-03-28 (×5): via INTRAVENOUS
  Filled 2016-03-25 (×5): qty 1000

## 2016-03-25 MED ORDER — EPHEDRINE SULFATE-NACL 50-0.9 MG/10ML-% IV SOSY
PREFILLED_SYRINGE | INTRAVENOUS | Status: DC | PRN
  Administered 2016-03-25 (×2): 5 mg via INTRAVENOUS

## 2016-03-25 MED ORDER — BUPIVACAINE-EPINEPHRINE 0.25% -1:200000 IJ SOLN
INTRAMUSCULAR | Status: DC | PRN
  Administered 2016-03-25: 30 mL

## 2016-03-25 MED ORDER — ACETAMINOPHEN 500 MG PO TABS
1000.0000 mg | ORAL_TABLET | ORAL | Status: AC
  Administered 2016-03-25: 1000 mg via ORAL
  Filled 2016-03-25: qty 2

## 2016-03-25 MED ORDER — FENTANYL CITRATE (PF) 100 MCG/2ML IJ SOLN
INTRAMUSCULAR | Status: DC | PRN
  Administered 2016-03-25 (×3): 50 ug via INTRAVENOUS
  Administered 2016-03-25: 100 ug via INTRAVENOUS

## 2016-03-25 MED ORDER — HYDROMORPHONE HCL 2 MG/ML IJ SOLN
INTRAMUSCULAR | Status: AC
  Filled 2016-03-25: qty 1

## 2016-03-25 MED ORDER — ACETAMINOPHEN 500 MG PO TABS
1000.0000 mg | ORAL_TABLET | Freq: Four times a day (QID) | ORAL | Status: AC
  Administered 2016-03-25 – 2016-03-26 (×4): 1000 mg via ORAL
  Filled 2016-03-25 (×5): qty 2

## 2016-03-25 SURGICAL SUPPLY — 101 items
BLADE EXTENDED COATED 6.5IN (ELECTRODE) ×3 IMPLANT
BLADE HEX COATED 2.75 (ELECTRODE) ×3 IMPLANT
BLADE SURG SZ10 CARB STEEL (BLADE) ×3 IMPLANT
CANNULA REDUC XI 12-8 STAPL (CANNULA) ×1
CANNULA REDUC XI 12-8MM STAPL (CANNULA) ×1
CANNULA REDUCER 12-8 DVNC XI (CANNULA) ×1 IMPLANT
CELLS DAT CNTRL 66122 CELL SVR (MISCELLANEOUS) IMPLANT
CHLORAPREP W/TINT 26ML (MISCELLANEOUS) ×3 IMPLANT
CLIP LIGATING HEM O LOK PURPLE (MISCELLANEOUS) IMPLANT
CLIP LIGATING HEMOLOK MED (MISCELLANEOUS) IMPLANT
COVER MAYO STAND STRL (DRAPES) ×3 IMPLANT
COVER SURGICAL LIGHT HANDLE (MISCELLANEOUS) ×3 IMPLANT
COVER TIP SHEARS 8 DVNC (MISCELLANEOUS) ×1 IMPLANT
COVER TIP SHEARS 8MM DA VINCI (MISCELLANEOUS) ×2
DECANTER SPIKE VIAL GLASS SM (MISCELLANEOUS) ×3 IMPLANT
DEVICE TROCAR PUNCTURE CLOSURE (ENDOMECHANICALS) IMPLANT
DRAIN CHANNEL 19F RND (DRAIN) IMPLANT
DRAPE COLUMN DVNC XI (DISPOSABLE) ×1 IMPLANT
DRAPE DA VINCI XI COLUMN (DISPOSABLE) ×2
DRAPE SHEET LG 3/4 BI-LAMINATE (DRAPES) IMPLANT
DRAPE SURG IRRIG POUCH 19X23 (DRAPES) ×3 IMPLANT
DRAPE WARM FLUID 44X44 (DRAPE) ×3 IMPLANT
DRSG OPSITE POSTOP 4X10 (GAUZE/BANDAGES/DRESSINGS) IMPLANT
DRSG OPSITE POSTOP 4X6 (GAUZE/BANDAGES/DRESSINGS) ×3 IMPLANT
DRSG OPSITE POSTOP 4X8 (GAUZE/BANDAGES/DRESSINGS) IMPLANT
ELECT PENCIL ROCKER SW 15FT (MISCELLANEOUS) ×3 IMPLANT
ELECT REM PT RETURN 9FT ADLT (ELECTROSURGICAL) ×3
ELECTRODE REM PT RTRN 9FT ADLT (ELECTROSURGICAL) ×1 IMPLANT
ENDOLOOP SUT PDS II  0 18 (SUTURE)
ENDOLOOP SUT PDS II 0 18 (SUTURE) IMPLANT
EVACUATOR DRAINAGE 10X20 100CC (DRAIN) IMPLANT
EVACUATOR SILICONE 100CC (DRAIN)
GAUZE SPONGE 4X4 12PLY STRL (GAUZE/BANDAGES/DRESSINGS) IMPLANT
GLOVE BIO SURGEON STRL SZ 6.5 (GLOVE) ×6 IMPLANT
GLOVE BIO SURGEONS STRL SZ 6.5 (GLOVE) ×3
GLOVE BIOGEL PI IND STRL 7.0 (GLOVE) ×3 IMPLANT
GLOVE BIOGEL PI INDICATOR 7.0 (GLOVE) ×6
GLOVE ECLIPSE 8.0 STRL XLNG CF (GLOVE) ×9 IMPLANT
GLOVE INDICATOR 8.0 STRL GRN (GLOVE) ×9 IMPLANT
GOWN STRL REUS W/TWL 2XL LVL3 (GOWN DISPOSABLE) ×9 IMPLANT
GOWN STRL REUS W/TWL XL LVL3 (GOWN DISPOSABLE) ×15 IMPLANT
HANDLE SUCTION POOLE (INSTRUMENTS) ×1 IMPLANT
IRRIG SUCT STRYKERFLOW 2 WTIP (MISCELLANEOUS) ×3
IRRIGATION SUCT STRKRFLW 2 WTP (MISCELLANEOUS) ×1 IMPLANT
KIT BASIN OR (CUSTOM PROCEDURE TRAY) ×6 IMPLANT
LEGGING LITHOTOMY PAIR STRL (DRAPES) ×3 IMPLANT
NEEDLE HYPO 22GX1.5 SAFETY (NEEDLE) ×3 IMPLANT
NEEDLE INSUFFLATION 14GA 120MM (NEEDLE) ×3 IMPLANT
PACK CARDIOVASCULAR III (CUSTOM PROCEDURE TRAY) ×3 IMPLANT
PACK GENERAL/GYN (CUSTOM PROCEDURE TRAY) ×3 IMPLANT
RTRCTR WOUND ALEXIS 18CM MED (MISCELLANEOUS)
SCISSORS LAP 5X35 DISP (ENDOMECHANICALS) ×3 IMPLANT
SEAL CANN UNIV 5-8 DVNC XI (MISCELLANEOUS) ×4 IMPLANT
SEAL XI 5MM-8MM UNIVERSAL (MISCELLANEOUS) ×8
SEALER TISSUE G2 STRG ARTC 35C (ENDOMECHANICALS) IMPLANT
SEALER VESSEL DA VINCI XI (MISCELLANEOUS) ×2
SEALER VESSEL EXT DVNC XI (MISCELLANEOUS) ×1 IMPLANT
SLEEVE XCEL OPT CAN 5 100 (ENDOMECHANICALS) IMPLANT
SOLUTION ANTI FOG 6CC (MISCELLANEOUS) ×3 IMPLANT
SOLUTION ELECTROLUBE (MISCELLANEOUS) ×3 IMPLANT
SPONGE DRAIN TRACH 4X4 STRL 2S (GAUZE/BANDAGES/DRESSINGS) ×3 IMPLANT
SPONGE LAP 18X18 X RAY DECT (DISPOSABLE) IMPLANT
STAPLER 45 BLU RELOAD XI (STAPLE) ×2 IMPLANT
STAPLER 45 BLUE RELOAD XI (STAPLE) ×4
STAPLER 45 GREEN RELOAD XI (STAPLE) ×6
STAPLER 45 GRN RELOAD XI (STAPLE) ×3 IMPLANT
STAPLER CANNULA SEAL DVNC XI (STAPLE) ×1 IMPLANT
STAPLER CANNULA SEAL XI (STAPLE) ×2
STAPLER CIRC ILS CVD 33MM 37CM (STAPLE) ×3 IMPLANT
STAPLER SHEATH (SHEATH) ×6
STAPLER SHEATH ENDOWRIST DVNC (SHEATH) ×3 IMPLANT
STAPLER VISISTAT 35W (STAPLE) IMPLANT
SUCTION POOLE HANDLE (INSTRUMENTS) ×3
SUT PDS AB 1 CTX 36 (SUTURE) IMPLANT
SUT PDS AB 1 TP1 96 (SUTURE) IMPLANT
SUT PROLENE 0 CT 2 (SUTURE) IMPLANT
SUT PROLENE 2 0 KS (SUTURE) ×3 IMPLANT
SUT SILK 2 0 (SUTURE) ×2
SUT SILK 2 0 SH CR/8 (SUTURE) ×3 IMPLANT
SUT SILK 2-0 18XBRD TIE 12 (SUTURE) ×1 IMPLANT
SUT SILK 3 0 (SUTURE) ×2
SUT SILK 3 0 SH CR/8 (SUTURE) ×3 IMPLANT
SUT SILK 3-0 18XBRD TIE 12 (SUTURE) ×1 IMPLANT
SUT VIC AB 2-0 SH 18 (SUTURE) ×3 IMPLANT
SUT VIC AB 2-0 SH 27 (SUTURE) ×2
SUT VIC AB 2-0 SH 27X BRD (SUTURE) ×1 IMPLANT
SUT VIC AB 4-0 PS2 27 (SUTURE) ×6 IMPLANT
SYR 20CC LL (SYRINGE) ×3 IMPLANT
SYR CONTROL 10ML LL (SYRINGE) ×3 IMPLANT
SYS LAPSCP GELPORT 120MM (MISCELLANEOUS)
SYSTEM LAPSCP GELPORT 120MM (MISCELLANEOUS) IMPLANT
TAPE CLOTH SURG 4X10 WHT LF (GAUZE/BANDAGES/DRESSINGS) ×3 IMPLANT
TOWEL OR 17X26 10 PK STRL BLUE (TOWEL DISPOSABLE) ×6 IMPLANT
TOWEL OR NON WOVEN STRL DISP B (DISPOSABLE) ×6 IMPLANT
TRAY FOLEY W/METER SILVER 14FR (SET/KITS/TRAYS/PACK) IMPLANT
TRAY FOLEY W/METER SILVER 16FR (SET/KITS/TRAYS/PACK) ×3 IMPLANT
TROCAR ADV FIXATION 5X100MM (TROCAR) ×3 IMPLANT
TUBING CONNECTING 10 (TUBING) IMPLANT
TUBING CONNECTING 10' (TUBING)
TUBING FILTER THERMOFLATOR (ELECTROSURGICAL) ×3 IMPLANT
YANKAUER SUCT BULB TIP 10FT TU (MISCELLANEOUS) ×3 IMPLANT

## 2016-03-25 NOTE — Transfer of Care (Signed)
Immediate Anesthesia Transfer of Care Note  Patient: David Jacobs  Procedure(s) Performed: Procedure(s): XI ROBOTIC ASSISTED LOWER ANTERIOR RESECTION (N/A)  Patient Location: PACU  Anesthesia Type:General  Level of Consciousness:  sedated, patient cooperative and responds to stimulation  Airway & Oxygen Therapy:Patient Spontanous Breathing and Patient connected to face mask oxgen  Post-op Assessment:  Report given to PACU RN and Post -op Vital signs reviewed and stable  Post vital signs:  Reviewed and stable  Last Vitals:  Vitals:   03/25/16 0611  BP: (!) 147/79  Pulse: 80  Resp: 20  Temp: [id number] C    Complications: No apparent anesthesia complications

## 2016-03-25 NOTE — Op Note (Signed)
03/25/2016  1:33 PM  PATIENT:  David Jacobs  65 y.o. male  Patient Care Team: No Pcp Per Patient as PCP - General (General Practice) Leighton Ruff, MD as Consulting Physician (General Surgery)  PRE-OPERATIVE DIAGNOSIS:  Proximal rectal cancer  POST-OPERATIVE DIAGNOSIS:  Proximal rectal cancer  PROCEDURE:  XI ROBOTIC ASSISTED LOWER ANTERIOR RESECTION   Surgeon(s): Leighton Ruff, MD Johnathan Hausen, MD  ASSISTANT: Dr Hassell Done   ANESTHESIA:   local and general  EBL: 16ml  Total I/O In: 2850 [I.V.:2800; IV Piggyback:50] Out: 61 [Urine:580; Drains:200; Blood:50]  Delay start of Pharmacological VTE agent (>24hrs) due to surgical blood loss or risk of bleeding:  no  DRAINS: (26F) Jackson-Pratt drain(s) with closed bulb suction in the pilvis   SPECIMEN:  Source of Specimen:  Rectum and sigmoid colon  DISPOSITION OF SPECIMEN:  PATHOLOGY  COUNTS:  YES  PLAN OF CARE: Admit to inpatient   PATIENT DISPOSITION:  PACU - hemodynamically stable.  INDICATION:    65 y.o. M with rectal cancer.  He underwent neoadjuvant treatment.  I recommended low anterior resection:  The anatomy & physiology of the digestive tract was discussed.  The pathophysiology was discussed.  Natural history risks without surgery was discussed.   I worked to give an overview of the disease and the frequent need to have multispecialty involvement.  I feel the risks of no intervention will lead to serious problems that outweigh the operative risks; therefore, I recommended a partial colectomy to remove the pathology.  Laparoscopic & open techniques were discussed.   Risks such as bleeding, infection, abscess, leak, reoperation, possible ostomy, hernia, heart attack, death, and other risks were discussed.  I noted a good likelihood this will help address the problem.   Goals of post-operative recovery were discussed as well.    The patient expressed understanding & wished to proceed with surgery.  OR FINDINGS:    Patient had a mass along the right wall of his proximal rectum ~12 cm from the anal verge  No obvious metastatic disease on visceral parietal peritoneum or liver.  The anastomosis rests 10 cm from the anal verge by rigid proctoscopy.  DESCRIPTION:   Informed consent was confirmed.  The patient underwent general anaesthesia without difficulty.  The patient was positioned appropriately.  VTE prevention in place.  The patient's abdomen was clipped, prepped, & draped in a sterile fashion.  Surgical timeout confirmed our plan.  The patient was positioned in reverse Trendelenburg.  Abdominal entry was gained using a Varies needle in the LUQ.  Entry was clean.  I induced carbon dioxide insufflation.  Camera inspection revealed no injury.  Extra ports were carefully placed under direct laparoscopic visualization.   I reflected the greater omentum and the upper abdomen the small bowel in the upper abdomen. The robot was then docked to the patient's left side. Instruments were placed under direct visualization. I scored the base of peritoneum of the right side of the mesentery of the left colon from the ligament of Treitz down to the peritoneal reflection.  The patient had no visible tattoo, so I performed a rigid sigmoidoscopy and identified the tumor approximately 12 cm from the anal verge on the right lateral wall of the proximal rectum.  I took down some sigmoid adhesions to the left pelvic wall using light cautery.  I elevated the sigmoid mesentery and enetered into the retro-mesenteric plane. We were able to identify the left ureter and gonadal vessels. We kept those posterior within the retroperitoneum and  elevated the left colon mesentery off that. I did isolated IMA pedicle but did not ligate it yet.  I continued distally and got into the avascular plane posterior to the mesorectum.  I took this down to the level of the coccyx posteriorly. I then dissected out the lateral edges of the mesorectum plane  also posteriorly. I then divided the anterior peritoneal reflection and lifted the seminal vesicles off of the rectum. Once these were freed we were able to dissect down laterally on both sides to allow for a complete margin. At that point I did identify tattoo on the anterior portion of the rectum approximately 2 cm distal from the peritoneal reflection.      I skeletonized the inferior mesenteric artery pedicle.  I went down to its takeoff from the aorta.  After confirming the left ureter was out of the way, I went ahead and ligated the inferior mesenteric artery pedicle with bipolar vessel sealer ~2cm above its takeoff from the aorta.   I then divided the remaining mesorectum to the level of the descending colon sigmoid junction.  We ensured hemostasis. I skeletonized the mesorectum at the level just distal to the tattoo using blunt dissection & bipolar robotic vessel sealer.  I mobilized the left colon in a lateral to medial fashion off the line of Toldt up towards the splenic flexure to ensure good mobilization of the left colon to reach into the pelvis.  I then divided the rectum using green stapler loads. Multiple firings sure needed mainly due to minimal space at that area of the rectum.  After this was completed, my 12 mm port site was enlarged an Gambrills wound protector was placed. I brought out the rectum and colon and transected at my previously dissected site. A purse stringer was applied and a 2-0 Prolene pursestring was created. A 33 mm EEA anvil was placed and the pursestring was tied tightly around this. An anastomosis was created using laparoscopic visualization. There was no tension on the anastomosis. There was no leak when insufflated under water. The anastomosis rests approximately 10 cm from anal verge. Once this was completed the omentum was brought down over the abdomen. A 19 [place] Blake drain was placed into the pelvis.  This was brought out through the right lower quadrant port site. It  secured with a 2-0 nylon suture.  We then switched to clean gowns, gloves, instruments and drapes.  The fascia of the extraction site was closed using interrupted 0 Novafil sutures. The subcutaneous tissue was closed using 2-0 Vicryl interrupted sutures. The skin was closed with a running 4-0 Vicryl suture. The port sites were also closed with 4-0 Vicryl suture. Dermabond was placed over the port sites and a dressing was placed over the extraction site. The patient was awakened from anesthesia and sent to the postanesthesia care unit in stable condition. All counts were correct per operating room staff.

## 2016-03-25 NOTE — H&P (Signed)
The patient is a 65 year old male who presents with colorectal cancer. 65 year old male who underwent colonoscopy with Dr. Paulita Fujita for hematochezia. This showed a partially obstructing ulcerated mass in the proximal rectum. This was tattooed. Biopsies confirmed adenocarcinoma. The patient has had blood in his stools for approximately 1-2 months. He denies any weight loss. He denies any changes in his bowel movements. He denies any abdominal pain. Pt is from [place] and [place]. Official interpreter was offered. Pt declided and wished for his daughter to translate instead.    Other Problems Mammie Lorenzo, LPN; 624THL D34-534 AM) High blood pressure Rectal Cancer  Past Surgical History Mammie Lorenzo, LPN; 624THL [id number] AM) No pertinent past surgical history  Diagnostic Studies History Mammie Lorenzo, LPN; 624THL [id number] AM) Colonoscopy within last year  Allergies Mammie Lorenzo, LPN; 624THL [id number] AM) No Known Drug Allergies05/15/2017  Medication History Mammie Lorenzo, LPN; 624THL 624THL AM) Norvasc (5MG  Tablet, Oral) Active. Losartan Potassium-HCTZ (100-25MG  Tablet, Oral) Active. Medications Reconciled  Social History Mammie Lorenzo, LPN; 624THL D34-534 AM) Alcohol use Occasional alcohol use. Caffeine use Coffee. No drug use Tobacco use Current every day smoker.  Family History Mammie Lorenzo, LPN; 624THL D34-534 AM) Diabetes Mellitus Father. Hypertension Mother. Thyroid problems Mother.    Review of Systems  General Not Present- Appetite Loss, Chills, Fatigue, Fever, Night Sweats, Weight Gain and Weight Loss. Skin Not Present- Change in Wart/Mole, Dryness, Hives, Jaundice, New Lesions, Non-Healing Wounds, Rash and Ulcer. HEENT Not Present- Earache, Hearing Loss, Hoarseness, Nose Bleed, Oral Ulcers, Ringing in the Ears, Seasonal Allergies, Sinus Pain, Sore Throat, Visual Disturbances, Wears glasses/contact lenses and Yellow  Eyes. Respiratory Not Present- Bloody sputum, Chronic Cough, Difficulty Breathing, Snoring and Wheezing. Breast Not Present- Breast Mass, Breast Pain, Nipple Discharge and Skin Changes. Cardiovascular Not Present- Chest Pain, Difficulty Breathing Lying Down, Leg Cramps, Palpitations, Rapid Heart Rate, Shortness of Breath and Swelling of Extremities. Gastrointestinal Present- Bloody Stool. Not Present- Abdominal Pain, Bloating, Change in Bowel Habits, Chronic diarrhea, Constipation, Difficulty Swallowing, Excessive gas, Gets full quickly at meals, Hemorrhoids, Indigestion, Nausea, Rectal Pain and Vomiting. Male Genitourinary Not Present- Blood in Urine, Change in Urinary Stream, Frequency, Impotence, Nocturia, Painful Urination, Urgency and Urine Leakage. Musculoskeletal Not Present- Back Pain, Joint Pain, Joint Stiffness, Muscle Pain, Muscle Weakness and Swelling of Extremities. Neurological Not Present- Decreased Memory, Fainting, Headaches, Numbness, Seizures, Tingling, Tremor, Trouble walking and Weakness. Endocrine Not Present- Cold Intolerance, Excessive Hunger, Hair Changes, Heat Intolerance, Hot flashes and New Diabetes. Hematology Not Present- Easy Bruising, Excessive bleeding, Gland problems, HIV and Persistent Infections.  BP (!) 147/79   Pulse 80   Temp 98.2 F (36.8 C) (Oral)   Resp 20   Ht 5\' 11"  (1.803 m)   Wt 94.3 kg (208 lb)   SpO2 100%   BMI 29.01 kg/m     Physical Exam  General Mental Status-Alert. General Appearance-Not in acute distress. Build & Nutrition-Well nourished. Posture-Normal posture. Gait-Normal.  Head and Neck Head-normocephalic, atraumatic with no lesions or palpable masses. Trachea-midline.  Chest and Lung Exam Chest and lung exam reveals -on auscultation, normal breath sounds, no adventitious sounds and normal vocal resonance.  Cardiovascular Cardiovascular examination reveals -normal heart sounds, regular rate and  rhythm with no murmurs and femoral artery auscultation bilaterally reveals normal pulses, no bruits, no thrills.  Abdomen Inspection Inspection of the abdomen reveals - No Hernias. Palpation/Percussion Palpation and Percussion of the abdomen reveal - Soft, Non Tender, No Rigidity (guarding), No hepatosplenomegaly and No Palpable abdominal masses.  Rectal Anorectal Exam Internal - Note: no palpable masses.  Neurologic Neurologic evaluation reveals -alert and oriented x 3 with no impairment of recent or remote memory, normal attention span and ability to concentrate, normal sensation and normal coordination.  Musculoskeletal Normal Exam - Bilateral-Upper Extremity Strength Normal and Lower Extremity Strength Normal.    Assessment & Plan  RECTAL CANCER (C20) Impression: 65 year old male who presents to the office with a new diagnosis of rectal cancer. He underwent a colonoscopy due to hematochezia. Colonoscopy revealed a fungating polypoid ulcerated partially obstructing mass in the proximal rectum. Biopsy showed invasive adenocarcinoma. The area was tattooed. Rectal ultrasound showed a T3N0 proximal rectal mass. CT scans of chest abdomen and pelvis show the rectal wall thickening but no other signs of metastatic disease. CEA was normal. He has completed his neoadjuvant  treatment and is ready for surgery.   The surgery and anatomy were described to the patient in his native language, as well as the risks of surgery and the possible complications.  These include: Bleeding, deep abdominal infections and possible wound complications such as hernia and infection, damage to adjacent structures, leak of surgical connections, which can lead to other surgeries and possibly an ostomy, possible need for other procedures, such as abscess drains in radiology, possible prolonged hospital stay, possible diarrhea from removal of part of the colon, possible constipation from narcotics, possible  sexual or bladder dysfunction, prolonged fatigue/weakness or appetite loss, possible early recurrence of of disease, possible complications of their medical problems such as heart disease or arrhythmias or lung problems, death (less than 1%). I believe the patient understands and wishes to proceed with the surgery.

## 2016-03-25 NOTE — Anesthesia Procedure Notes (Signed)
Procedure Name: Intubation Date/Time: 03/25/2016 7:31 AM Performed by: Anne Fu Pre-anesthesia Checklist: Patient identified, Emergency Drugs available, Suction available, Patient being monitored and Timeout performed Patient Re-evaluated:Patient Re-evaluated prior to inductionOxygen Delivery Method: Circle system utilized Preoxygenation: Pre-oxygenation with 100% oxygen Intubation Type: IV induction Ventilation: Mask ventilation without difficulty Laryngoscope Size: Mac and 4 Grade View: Grade I Tube type: Oral Tube size: 7.5 mm Number of attempts: 1 Airway Equipment and Method: Stylet Placement Confirmation: ETT inserted through vocal cords under direct vision,  positive ETCO2,  CO2 detector and breath sounds checked- equal and bilateral Secured at: 23 cm Tube secured with: Tape Dental Injury: Teeth and Oropharynx as per pre-operative assessment

## 2016-03-26 LAB — BASIC METABOLIC PANEL
ANION GAP: 7 (ref 5–15)
BUN: 10 mg/dL (ref 6–20)
CHLORIDE: 104 mmol/L (ref 101–111)
CO2: 28 mmol/L (ref 22–32)
Calcium: 8.5 mg/dL — ABNORMAL LOW (ref 8.9–10.3)
Creatinine, Ser: 1.09 mg/dL (ref 0.61–1.24)
GFR calc Af Amer: >60 mL/min (ref >60)
GLUCOSE: 111 mg/dL — AB (ref 65–99)
POTASSIUM: 3.6 mmol/L (ref 3.5–5.1)
Sodium: 139 mmol/L (ref 135–145)

## 2016-03-26 LAB — CBC
HEMATOCRIT: 40.4 % (ref 39.0–52.0)
HEMOGLOBIN: 14 g/dL (ref 13.0–17.0)
MCH: 32.7 pg (ref 26.0–34.0)
MCHC: 34.7 g/dL (ref 30.0–36.0)
MCV: 94.4 fL (ref 78.0–100.0)
Platelets: 256 10*3/uL (ref 150–400)
RBC: 4.28 MIL/uL (ref 4.22–5.81)
RDW: 15.8 % — ABNORMAL HIGH (ref 11.5–15.5)
WBC: 7.5 10*3/uL (ref 4.0–10.5)

## 2016-03-26 NOTE — Progress Notes (Signed)
1 Day Post-Op  Subjective: Denies pain, nausea, vomiting.    Objective: Vital signs in last 24 hours: Temp:  [97.1 F (36.2 C)-98.7 F (37.1 C)] 98.2 F (36.8 C) (09/02 0512) Pulse Rate:  [61-74] 63 (09/02 0512) Resp:  [10-16] 16 (09/02 0512) BP: (106-127)/(48-91) 125/65 (09/02 0512) SpO2:  [95 %-100 %] 100 % (09/02 0512) FiO2 (%):  [2 %] 2 % (09/01 1724) Last BM Date: 03/24/16  Intake/Output from previous day: 09/01 0701 - 09/02 0700 In: 2890 [I.V.:2800; IV Piggyback:50] Out: [id number] [Urine:2755; Drains:363; Blood:50] Intake/Output this shift: No intake/output data recorded.  General appearance: alert, cooperative and no distress Resp: breathing comfortably GI: soft, non distended, approp tender, RLQ dressing d/c/i. drain serosang  Lab Results:   Recent Labs  03/26/16 0502  WBC 7.5  HGB 14.0  HCT 40.4  PLT 256   BMET  Recent Labs  03/26/16 0502  NA 139  K 3.6  CL 104  CO2 28  GLUCOSE 111*  BUN 10  CREATININE 1.09  CALCIUM 8.5*   PT/INR No results for input(s): LABPROT, INR in the last 72 hours. ABG No results for input(s): PHART, HCO3 in the last 72 hours.  Invalid input(s): PCO2, PO2  Studies/Results: No results found.  Anti-infectives: Anti-infectives    Start     Dose/Rate Route Frequency Ordered Stop   03/25/16 2000  cefoTEtan (CEFOTAN) 2 g in dextrose 5 % 50 mL IVPB     2 g 100 mL/hr over 30 Minutes Intravenous Every 12 hours 03/25/16 1413 03/25/16 2025   03/25/16 0610  cefoTEtan (CEFOTAN) 2 g in dextrose 5 % 50 mL IVPB     2 g 100 mL/hr over 30 Minutes Intravenous On call to O.R. 03/25/16 0610 03/25/16 0740      Assessment/Plan: s/p Procedure(s): XI ROBOTIC ASSISTED LOWER ANTERIOR RESECTION (N/A) PAS Continue foley due to urinary output monitoring plan d/c foley in AM  Clears today.   LOS: 1 day    [id number] [facility] 03/26/2016

## 2016-03-26 NOTE — Evaluation (Signed)
Physical Therapy Evaluation Patient Details Name: David Jacobs MRN: [id number] DOB: [birth year] Today's Date: 03/26/2016   History of Present Illness  pt s/o XI ROBOTIC ASSISTED LOWER ANTERIOR RESECTION on 03/25/2016. with JP drain on R side at this time. [place].   Clinical Impression  Pt tolerated session well with ambulation in  room. Was not set up for ambulation in hallway at this time and intepretter only explained walking in room at this time. Pt will be fine to walk in hallway next time with nursing and encourage at least one more this evening. No further PT needs at this time. Will sign off.     Follow Up Recommendations No PT follow up    Equipment Recommendations  None recommended by PT    Recommendations for Other Services       Precautions / Restrictions Precautions Precautions: None      Mobility  Bed Mobility Overal bed mobility: Modified Independent             General bed mobility comments: used bed rails just due to pain in abdomen in right side with bed mobility  Transfers Overall transfer level: Independent Equipment used: None                Ambulation/Gait Ambulation/Gait assistance: Modified independent (Device/Increase time) Ambulation Distance (Feet): 25 Feet Assistive device: None Gait Pattern/deviations: WFL(Within Functional Limits)     General Gait Details: ambulated in room only due to not being ready with posterior gown , direction etc for hallway walking. Walked with normal gait no LOB in room. communicated the to [facility] and nurse assistant to encourage hallway wlakign with patient this evening./   Stairs            Wheelchair Mobility    Modified Rankin (Stroke Patients Only)       Balance                                             Pertinent Vitals/Pain Pain Assessment: Faces (R abdonimal side, nurse present and will check on him when next pain medicines are due. )    Home Living  Family/patient expects to be discharged to:: Private residence Living Arrangements: Children;Spouse/significant other Available Help at Discharge: Family                  Prior Function Level of Independence: Independent               Hand Dominance        Extremity/Trunk Assessment               Lower Extremity Assessment: Overall WFL for tasks assessed         Communication   Communication: Interpreter utilized;Prefers language other than English (croatian. Tried the [employer], however pt calle dhis daughter Hansel Feinstein and he wanted herr to interpret and they agreed. )  Cognition Arousal/Alertness: Awake/alert Behavior During Therapy: WFL for tasks assessed/performed Overall Cognitive Status: Within Functional Limits for tasks assessed                      General Comments      Exercises        Assessment/Plan    PT Assessment Patent does not need any further PT services  PT Diagnosis Difficulty walking   PT Problem List    PT Treatment Interventions  PT Goals (Current goals can be found in the Care Plan section) Acute Rehab PT Goals PT Goal Formulation: All assessment and education complete, DC therapy    Frequency     Barriers to discharge        Co-evaluation               End of Session   Activity Tolerance: Patient tolerated treatment well Patient left: in bed;with call bell/phone within reach;with bed alarm set Nurse Communication: Mobility status         Time: [id number] PT Time Calculation (min) (ACUTE ONLY): 21 min   Charges:   PT Evaluation $PT Eval Low Complexity: 1 Procedure     PT G CodesClide Dales April 15, 2016, 4:22 PM  Clide Dales, PT Pager: [phone] Apr 15, 2016

## 2016-03-26 NOTE — Progress Notes (Signed)
PT Note  Patient Details Name: Dewie Koepke MRN: [id number] DOB: [birth year]   Noted order for PT consult. Chart reviewed and will be attempting to see patient today. Call if any needs prior to my arrival. Thanks      Clide Dales 03/26/2016, 9:05 AM  Clide Dales, PT Pager: [phone] 03/26/2016

## 2016-03-27 LAB — BASIC METABOLIC PANEL
ANION GAP: 5 (ref 5–15)
BUN: 8 mg/dL (ref 6–20)
CHLORIDE: 105 mmol/L (ref 101–111)
CO2: 28 mmol/L (ref 22–32)
CREATININE: 0.86 mg/dL (ref 0.61–1.24)
Calcium: 8.7 mg/dL — ABNORMAL LOW (ref 8.9–10.3)
GFR calc non Af Amer: >60 mL/min (ref >60)
Glucose, Bld: 123 mg/dL — ABNORMAL HIGH (ref 65–99)
Potassium: 3.6 mmol/L (ref 3.5–5.1)
Sodium: 138 mmol/L (ref 135–145)

## 2016-03-27 LAB — CBC
HCT: 38.9 % — ABNORMAL LOW (ref 39.0–52.0)
HEMOGLOBIN: 13.7 g/dL (ref 13.0–17.0)
MCH: 32.5 pg (ref 26.0–34.0)
MCHC: 35.2 g/dL (ref 30.0–36.0)
MCV: 92.2 fL (ref 78.0–100.0)
Platelets: 260 10*3/uL (ref 150–400)
RBC: 4.22 MIL/uL (ref 4.22–5.81)
RDW: 15.4 % (ref 11.5–15.5)
WBC: 7.3 10*3/uL (ref 4.0–10.5)

## 2016-03-27 MED ORDER — OXYCODONE HCL 5 MG PO TABS
5.0000 mg | ORAL_TABLET | ORAL | Status: DC | PRN
  Administered 2016-03-27: 5 mg via ORAL
  Filled 2016-03-27: qty 1

## 2016-03-27 NOTE — Progress Notes (Signed)
Patient ID: David Jacobs, male   DOB: 12/10/1950, 65 y.o.   MRN: [id number] 2 Days Post-Op  Subjective: Denies pain, nausea, vomiting.  Tolerated clears.  + flatus.    Objective: Vital signs in last 24 hours: Temp:  [98.3 F (36.8 C)-98.8 F (37.1 C)] 98.6 F (37 C) (09/03 0615) Resp:  [16] 16 (09/03 0615) BP: (114-138)/(62-76) 134/76 (09/03 0615) SpO2:  [96 %-100 %] 98 % (09/03 0615) Last BM Date: 03/24/16  Intake/Output from previous day: 09/02 0701 - 09/03 0700 In: 2755 [P.O.:960; I.V.:1795] Out: 4870 [Urine:4725; Drains:145] Intake/Output this shift: No intake/output data recorded.  General appearance: alert, cooperative and no distress Resp: breathing comfortably GI: soft, non distended, approp tender, RLQ dressing d/c/i. drain serosang  Lab Results:   Recent Labs  03/26/16 0502 03/27/16 0538  WBC 7.5 7.3  HGB 14.0 13.7  HCT 40.4 38.9*  PLT 256 260   BMET  Recent Labs  03/26/16 0502 03/27/16 0538  NA 139 138  K 3.6 3.6  CL 104 105  CO2 28 28  GLUCOSE 111* 123*  BUN 10 8  CREATININE 1.09 0.86  CALCIUM 8.5* 8.7*   PT/INR No results for input(s): LABPROT, INR in the last 72 hours. ABG No results for input(s): PHART, HCO3 in the last 72 hours.  Invalid input(s): PCO2, PO2  Studies/Results: No results found.  Anti-infectives: Anti-infectives    Start     Dose/Rate Route Frequency Ordered Stop   03/25/16 2000  cefoTEtan (CEFOTAN) 2 g in dextrose 5 % 50 mL IVPB     2 g 100 mL/hr over 30 Minutes Intravenous Every 12 hours 03/25/16 1413 03/25/16 2025   03/25/16 0610  cefoTEtan (CEFOTAN) 2 g in dextrose 5 % 50 mL IVPB     2 g 100 mL/hr over 30 Minutes Intravenous On call to O.R. 03/25/16 0610 03/25/16 0740      Assessment/Plan: s/p Procedure(s): XI ROBOTIC ASSISTED LOWER ANTERIOR RESECTION (N/A) PAS D/c foley Full liquids Ambulate.    LOS: 2 days    [facility] 03/27/2016

## 2016-03-28 LAB — CBC
HCT: 37.4 % — ABNORMAL LOW (ref 39.0–52.0)
HEMOGLOBIN: 13.2 g/dL (ref 13.0–17.0)
MCH: 32.8 pg (ref 26.0–34.0)
MCHC: 35.3 g/dL (ref 30.0–36.0)
MCV: 92.8 fL (ref 78.0–100.0)
PLATELETS: 298 10*3/uL (ref 150–400)
RBC: 4.03 MIL/uL — AB (ref 4.22–5.81)
RDW: 15.4 % (ref 11.5–15.5)
WBC: 8.5 10*3/uL (ref 4.0–10.5)

## 2016-03-28 LAB — BASIC METABOLIC PANEL
Anion gap: 7 (ref 5–15)
BUN: 9 mg/dL (ref 6–20)
CALCIUM: 8.8 mg/dL — AB (ref 8.9–10.3)
CHLORIDE: 107 mmol/L (ref 101–111)
CO2: 25 mmol/L (ref 22–32)
CREATININE: 0.96 mg/dL (ref 0.61–1.24)
GFR calc non Af Amer: >60 mL/min (ref >60)
GLUCOSE: 126 mg/dL — AB (ref 65–99)
Potassium: 3.6 mmol/L (ref 3.5–5.1)
Sodium: 139 mmol/L (ref 135–145)

## 2016-03-28 NOTE — Anesthesia Postprocedure Evaluation (Signed)
Anesthesia Post Note  Patient: David Jacobs  Procedure(s) Performed: Procedure(s) (LRB): XI ROBOTIC ASSISTED LOWER ANTERIOR RESECTION (N/A)  Patient location during evaluation: PACU Anesthesia Type: General Level of consciousness: sedated Pain management: satisfactory to patient Vital Signs Assessment: post-procedure vital signs reviewed and stable Respiratory status: spontaneous breathing Cardiovascular status: stable Anesthetic complications: no    Last Vitals:  Vitals:   03/27/16 2121 03/28/16 0635  BP: 128/63 131/66  Pulse: 64 65  Resp: 18 18  Temp: 36.9 C 36.8 C    Last Pain:  Vitals:   03/28/16 0741  TempSrc:   PainSc: 0-No pain                 Saidah Kempton EDWARD

## 2016-03-28 NOTE — Progress Notes (Signed)
Patient ID: David Jacobs, male   DOB: 1951/07/04, 65 y.o.   MRN: [id number] 3 Days Post-Op  Subjective: Denies pain, nausea, vomiting.  Tolerated full liquids.    + flatus. No BM yet.    Objective: Vital signs in last 24 hours: Temp:  [98.1 F (36.7 C)-98.4 F (36.9 C)] 98.3 F (36.8 C) (09/04 0635) Pulse Rate:  [64-67] 65 (09/04 0635) Resp:  [18] 18 (09/04 0635) BP: (128-132)/(63-66) 131/66 (09/04 0635) SpO2:  [96 %-100 %] 100 % (09/04 0635) Last BM Date: 03/24/16  Intake/Output from previous day: 09/03 0701 - 09/04 0700 In: 1801.3 [I.V.:1801.3] Out: 1160 [Urine:1100; Drains:60] Intake/Output this shift: No intake/output data recorded.  General appearance: alert, cooperative and no distress Resp: breathing comfortably GI: soft, non distended, approp tender, RLQ dressing d/c/i. drain serosang  Lab Results:   Recent Labs  03/27/16 0538 03/28/16 0437  WBC 7.3 8.5  HGB 13.7 13.2  HCT 38.9* 37.4*  PLT 260 298   BMET  Recent Labs  03/27/16 0538 03/28/16 0437  NA 138 139  K 3.6 3.6  CL 105 107  CO2 28 25  GLUCOSE 123* 126*  BUN 8 9  CREATININE 0.86 0.96  CALCIUM 8.7* 8.8*   PT/INR No results for input(s): LABPROT, INR in the last 72 hours. ABG No results for input(s): PHART, HCO3 in the last 72 hours.  Invalid input(s): PCO2, PO2  Studies/Results: No results found.  Anti-infectives: Anti-infectives    Start     Dose/Rate Route Frequency Ordered Stop   03/25/16 2000  cefoTEtan (CEFOTAN) 2 g in dextrose 5 % 50 mL IVPB     2 g 100 mL/hr over 30 Minutes Intravenous Every 12 hours 03/25/16 1413 03/25/16 2025   03/25/16 0610  cefoTEtan (CEFOTAN) 2 g in dextrose 5 % 50 mL IVPB     2 g 100 mL/hr over 30 Minutes Intravenous On call to O.R. 03/25/16 0610 03/25/16 0740      Assessment/Plan: s/p Procedure(s): XI ROBOTIC ASSISTED LOWER ANTERIOR RESECTION (N/A) PAS  Advance to regular diet.  D/c IVF.  Po pain meds.  prob home tomorrow.    Ambulate.    LOS: 3 days    [facility] 03/28/2016

## 2016-03-29 ENCOUNTER — Encounter (HOSPITAL_COMMUNITY): Payer: Self-pay | Admitting: General Surgery

## 2016-03-29 MED ORDER — OXYCODONE HCL 5 MG PO TABS: 5.0000 mg | ORAL_TABLET | ORAL | 0 refills | Status: DC | PRN

## 2016-03-29 NOTE — Progress Notes (Signed)
Pt was discharged home with daughter today.Discharge instructions were given to daughter as well as pt and all questions were answered by RN. Pt was taken by wheelchair to car my NT. [place]

## 2016-03-29 NOTE — Discharge Instructions (Signed)

## 2016-03-29 NOTE — Discharge Summary (Signed)
Physician Discharge Summary  Patient ID: Franciszek Arras MRN: [id number] DOB/AGE: 09/17/1950 65 y.o.  Admit date: 03/25/2016 Discharge date: 03/29/2016  Admission Diagnoses: Rectal cancer  Discharge Diagnoses:  Active Problems:   Rectal cancer [facility])   Discharged Condition: good  Hospital Course: 65 y.o. M with rectal cancer.  Admitted to floor after surgery.  His diet was advanced as tolerated.  By POD 4 he was tolerating solid food and having bowel function.  His pain was controlled and he was ambulating without difficulty.    Consults: None  Significant Diagnostic Studies: labs: cbc, chemistry  Treatments: IV hydration, analgesia: acetaminophen w/ codeine and surgery: robotic LAR  Discharge Exam: Blood pressure 113/66, pulse 72, temperature 97.9 F (36.6 C), temperature source Oral, resp. rate 18, height 5\' 11"  (1.803 m), weight 94.3 kg (208 lb), SpO2 96 %. General appearance: alert and cooperative GI: soft, JP SS fluid Incision/Wound: clean, dry, intact  Disposition: 01-Home or Self Care     Medication List    TAKE these medications   amLODipine 5 MG tablet Commonly known as:  NORVASC Take 1 tablet (5 mg total) by mouth daily.   losartan-hydrochlorothiazide 100-25 MG tablet Commonly known as:  HYZAAR Take 1 tablet by mouth daily.   oxyCODONE 5 MG immediate release tablet Commonly known as:  Oxy IR/ROXICODONE Take 1-2 tablets (5-10 mg total) by mouth every 4 (four) hours as needed for moderate pain, severe pain or breakthrough pain.      Follow-up Information    Rosario Adie., MD. Schedule an appointment as soon as possible for a visit in 2 week(s).   Specialty:  General Surgery Contact information: [address] [place] [address] [phone]           Signed: Rosario Adie [id number], [id number] AM

## 2016-04-22 ENCOUNTER — Encounter: Payer: Self-pay | Admitting: Hematology

## 2016-04-22 ENCOUNTER — Other Ambulatory Visit (HOSPITAL_BASED_OUTPATIENT_CLINIC_OR_DEPARTMENT_OTHER): Payer: No Typology Code available for payment source

## 2016-04-22 ENCOUNTER — Ambulatory Visit (HOSPITAL_BASED_OUTPATIENT_CLINIC_OR_DEPARTMENT_OTHER): Payer: No Typology Code available for payment source | Admitting: Hematology

## 2016-04-22 ENCOUNTER — Encounter: Payer: Self-pay | Admitting: Pharmacist

## 2016-04-22 VITALS — BP 152/75 | HR 84 | Temp 98.5°F | Resp 20 | Ht 71.0 in | Wt 202.7 lb

## 2016-04-22 DIAGNOSIS — C20 Malignant neoplasm of rectum: Secondary | ICD-10-CM | POA: Diagnosis not present

## 2016-04-22 DIAGNOSIS — I10 Essential (primary) hypertension: Secondary | ICD-10-CM | POA: Diagnosis not present

## 2016-04-22 DIAGNOSIS — Z720 Tobacco use: Secondary | ICD-10-CM

## 2016-04-22 LAB — COMPREHENSIVE METABOLIC PANEL
ALBUMIN: 3.8 g/dL (ref 3.5–5.0)
ALK PHOS: 82 U/L (ref 40–150)
ALT: 17 U/L (ref 0–55)
ANION GAP: 11 meq/L (ref 3–11)
AST: 11 U/L (ref 5–34)
BUN: 18.5 mg/dL (ref 7.0–26.0)
CALCIUM: 10.1 mg/dL (ref 8.4–10.4)
CHLORIDE: 104 meq/L (ref 98–109)
CO2: 25 mEq/L (ref 22–29)
Creatinine: 1.2 mg/dL (ref 0.7–1.3)
EGFR: 66 mL/min/{1.73_m2} — AB (ref >90)
Glucose: 108 mg/dl (ref 70–140)
POTASSIUM: 4.5 meq/L (ref 3.5–5.1)
Sodium: 140 mEq/L (ref 136–145)
Total Bilirubin: 0.4 mg/dL (ref 0.20–1.20)
Total Protein: 7.7 g/dL (ref 6.4–8.3)

## 2016-04-22 LAB — CBC WITH DIFFERENTIAL/PLATELET
BASO%: 0.3 % (ref 0.0–2.0)
BASOS ABS: 0 10*3/uL (ref 0.0–0.1)
EOS ABS: 0.3 10*3/uL (ref 0.0–0.5)
EOS%: 2.9 % (ref 0.0–7.0)
HEMATOCRIT: 43.4 % (ref 38.4–49.9)
HEMOGLOBIN: 15.1 g/dL (ref 13.0–17.1)
LYMPH#: 2.3 10*3/uL (ref 0.9–3.3)
LYMPH%: 23 % (ref 14.0–49.0)
MCH: 32.3 pg (ref 27.2–33.4)
MCHC: 34.8 g/dL (ref 32.0–36.0)
MCV: 92.9 fL (ref 79.3–98.0)
MONO#: 0.6 10*3/uL (ref 0.1–0.9)
MONO%: 6.1 % (ref 0.0–14.0)
NEUT#: 6.8 10*3/uL — ABNORMAL HIGH (ref 1.5–6.5)
NEUT%: 67.7 % (ref 39.0–75.0)
PLATELETS: 339 10*3/uL (ref 140–400)
RBC: 4.67 10*6/uL (ref 4.20–5.82)
RDW: 13.6 % (ref 11.0–14.6)
WBC: 10 10*3/uL (ref 4.0–10.3)

## 2016-04-22 MED ORDER — CAPECITABINE 500 MG PO TABS: 1000.0000 mg/m2 | ORAL_TABLET | Freq: Two times a day (BID) | ORAL | 0 refills | Status: DC

## 2016-04-22 NOTE — Progress Notes (Signed)
Oral Chemotherapy Pharmacist Encounter Oral Chemo clinic received new Rx for Xeloda. Reviewed dose and labs, checked for DDI. Xeloda has a moderate DDI interaction with the patient's BP medication HCTZ/losartan for possibilty of increased myelosupression. Neither medication is new to this patient and combination has been tolerated in the past.  The prescription has been sent to [facility] for benefit analysis and approval. This is were the patient previous had medication filled when he was taking medication previously   Will follow up with patient regarding insurance and pharmacy.   Thank you,  Nuala Alpha, PharmD Oral Chemotherapy Clinic [phone]

## 2016-04-22 NOTE — Progress Notes (Signed)
[place]  [phone] [phone]  Clinic Follow Up Note   Patient Care Team: No Pcp Per Patient as PCP - General (General Practice) Leighton Ruff, MD as Consulting Physician (General Surgery) 04/22/2016   CHIEF COMPLAINTS:  Follow Up rectal cancer  Oncology History   Rectal cancer [facility])   Staging form: Colon and Rectum, AJCC 7th Edition     Clinical stage from 12/09/2015: Stage IIA (T3, N0, M0) - Signed by Truitt Merle, MD on 12/22/2015       Rectal cancer ([place])   11/23/2015 Initial Diagnosis    Rectal cancer ([place])      11/23/2015 Procedure    Colonoscopy showed a large fungating ulcerated partially obstructing mass in the proximal rectum. This was biopsied.      11/23/2015 Initial Biopsy    Rectal mass biopsy showed adenocarcinoma      11/25/2015 Imaging    CT chest, abdomen and pelvis with contrast showed focal wall thickening involving the low rectum, no findings suspicious for metastatic disease.      12/09/2015 Procedure    EUS showed a T3 N0 lesion in proximal rectum.      12/22/2015 - 02/01/2016 Radiation Therapy    New adjuvant irradiation to the rectal cancer      12/22/2015 - 02/01/2016 Chemotherapy    Neoadjuvant Xeloda 2000mg  in AM, and 1500mg  in PM with concurrent radiation       03/25/2016 Surgery    Robotic-assisted anterior resection of proximal rectal cancer      03/25/2016 Pathology Results    Rectal sigmoid segmental resection showed adenocarcinoma with muscularis propria and aubmucosa invasion, margins were negative, extensive fibrosis, 12 lymph nodes were negative.       HISTORY OF PRESENTING ILLNESS (12/07/2015):  David Jacobs 65 y.o. male is here because of His recently diagnosed rectal cancer. He is accompanied by an interpreter and daughter-in-law [name],  to the clinic today.  He has been having rectal bleeding for 3 months, stable overall, small amount, usually once a day, no pain, he otherwise feels well, no  fatigue or weight change lately. He was referred by his primary care physician to gastroenterologist Dr. Paulita Fujita, and underwent colonoscopy on 11/23/2015. It showed a fungating ulcerated partially obstructing mass in the proximal rectum, They showed adenocarcinoma. CT scan revealed no evidence of distant metastasis.He was referred to [place] for further management.  He works for the [employer]still works full-time. He livers with his wife, one of his son and his family. He never had a colonoscopy before he reason one. No family history of colon cancer.    CURRENT THERAPY: recovering from surgery   INTERIM HISTORY: Mr. Gaede returns for follow-up. He uderwent robotic-assisted anterior resection of proximal rectal cancer on 03/25/2016. He tolerated surgery well without complications.  He has recovered well from his surgery, his appetite and energy level has been back to normal. He has a minimal pain at his small surgical incisions. His main complain is frequent bowel mvoement especially when he stands or walks around. His bowel movement sometimes loose, sometimes normal, he has 2-4 times a day. No hematochezia. No nausea, abdominal discomfort or other new complaints.  MEDICAL HISTORY:  Past Medical History:  Diagnosis Date  . Hypertension     SURGICAL HISTORY: Past Surgical History:  Procedure Laterality Date  . EUS N/A 12/09/2015   Procedure: LOWER ENDOSCOPIC ULTRASOUND (EUS);  Surgeon: [name], MD;  Location: [facility];  Service: Endoscopy;  Laterality: N/A;  .  XI ROBOTIC ASSISTED LOWER ANTERIOR RESECTION N/A 03/25/2016   Procedure: XI ROBOTIC ASSISTED LOWER ANTERIOR RESECTION;  Surgeon: Leighton Ruff, MD;  Location: [facility];  Service: General;  Laterality: N/A;    SOCIAL HISTORY: Social History   Social History  . Marital status: Married    Spouse name: N/A  . Number of children: 2  . Years of education: N/A   Occupational History  . Not on file.   Social History Main  Topics  . Smoking status: Current Every Day Smoker    Packs/day: 0.50    Years: 35.00  . Smokeless tobacco: Never Used  . Alcohol use No  . Drug use: No  . Sexual activity: Not on file   Other Topics Concern  . Not on file   Social History Narrative   Married, lives w/his wife and a son--minimal English   Works full time at [employer]   Current everyday smoker, with no plans to quit   Primary contact is daughter-in-law, Jaryd Lammert (can interpret for him)    FAMILY HISTORY: Family History  Problem Relation Age of Onset  . Cancer Mother 55    throid cancer   . Hyperlipidemia Mother   . Diabetes Father     ALLERGIES:  has No Known Allergies.  MEDICATIONS:  Current Outpatient Prescriptions  Medication Sig Dispense Refill  . losartan-hydrochlorothiazide (HYZAAR) 100-25 MG tablet Take 1 tablet by mouth daily.    Marland Kitchen amLODipine (NORVASC) 5 MG tablet Take 1 tablet (5 mg total) by mouth daily. (Patient not taking: Reported on 04/22/2016) 30 tablet 1   No current facility-administered medications for this visit.     REVIEW OF SYSTEMS:   Constitutional: Denies fevers, chills or abnormal night sweats Eyes: Denies blurriness of vision, double vision or watery eyes Ears, nose, mouth, throat, and face: Denies mucositis or sore throat Respiratory: Denies cough, dyspnea or wheezes Cardiovascular: Denies palpitation, chest discomfort or lower extremity swelling Gastrointestinal:  Denies nausea, heartburn or change in bowel habits Skin: Denies abnormal skin rashes Lymphatics: Denies new lymphadenopathy or easy bruising Neurological:Denies numbness, tingling or new weaknesses Behavioral/Psych: Mood is stable, no new changes  All other systems were reviewed with the patient and are negative.  PHYSICAL EXAMINATION: ECOG PERFORMANCE STATUS: 1  Vitals:   04/22/16 1458  BP: (!) 152/75  Pulse: 84  Resp: 20  Temp: 98.5 F (36.9 C)   Filed Weights   04/22/16 1458    Weight: 202 lb 11.2 oz (91.9 kg)    GENERAL:alert, no distress and comfortable SKIN: skin color, texture, turgor are normal, no rashes or significant lesions EYES: normal, conjunctiva are pink and non-injected, sclera clear OROPHARYNX:no exudate, no erythema and lips, buccal mucosa, and tongue normal  NECK: supple, thyroid normal size, non-tender, without nodularity LYMPH:  no palpable lymphadenopathy in the cervical, axillary or inguinal LUNGS: clear to auscultation and percussion with normal breathing effort HEART: regular rate & rhythm and no murmurs and no lower extremity edema ABDOMEN:abdomen soft, non-tender and normal bowel sounds, no organomegaly, multiple small surgical scars have healed well.  Musculoskeletal:no cyanosis of digits and no clubbing  PSYCH: alert & oriented x 3 with fluent speech NEURO: no focal motor/sensory deficits  LABORATORY DATA:  I have reviewed the data as listed CBC Latest Ref Rng & Units 04/22/2016 03/28/2016 03/27/2016  WBC 4.0 - 10.3 10e3/uL 10.0 8.5 7.3  Hemoglobin 13.0 - 17.1 g/dL 15.1 13.2 13.7  Hematocrit 38.4 - 49.9 % 43.4 37.4(L) 38.9(L)  Platelets 140 -  400 10e3/uL 339 298 260    CMP Latest Ref Rng & Units 04/22/2016 03/28/2016 03/27/2016  Glucose 70 - 140 mg/dl 108 126(H) 123(H)  BUN 7.0 - 26.0 mg/dL 18.5 9 8   Creatinine 0.7 - 1.3 mg/dL 1.2 0.96 0.86  Sodium 136 - 145 mEq/L 140 139 138  Potassium 3.5 - 5.1 mEq/L 4.5 3.6 3.6  Chloride 101 - 111 mmol/L - 107 105  CO2 22 - 29 mEq/L 25 25 28   Calcium 8.4 - 10.4 mg/dL 10.1 8.8(L) 8.7(L)  Total Protein 6.4 - 8.3 g/dL 7.7 - -  Total Bilirubin 0.20 - 1.20 mg/dL 0.40 - -  Alkaline Phos 40 - 150 U/L 82 - -  AST 5 - 34 U/L 11 - -  ALT 0 - 55 U/L 17 - -   Diagnosis 03/25/2016 1. Colon, segmental resection for tumor, rectosigmoid - ADENOCARCINOMA WITHIN MUSCULARIS PROPRIA AND FOCALLY SUBMUCOSA. - MARGINS NOT INVOLVED. - EXTENSIVE FIBROSIS WITH DYSTROPHIC CALCIFICATIONS. - TWELVE BENIGN LYMPH NODES  (0/12). - SEE ONCOLOGY TABLE BELOW. 2. Colon, resection margin (donut), final distal - BENIGN COLON. - NO EVIDENCE OF MALIGNANCY.  Microscopic Comment 1. COLON AND RECTUM (INCLUDING TRANS-ANAL RESECTION): Specimen: Rectosigmoid colon. Procedure: Resection. Tumor site: Proximal rectum, right lateral wall. Specimen integrity: Intact. Macroscopic intactness of mesorectum: Complete. Macroscopic tumor perforation: No. Invasive tumor: Maximum size: 0.4 cm, see comment. Histologic type(s): Colorectal adenocarcinoma. Histologic grade and differentiation: G3: poorly differentiated/high grade. Type of polyp in which invasive carcinoma arose: No residual polyp. Microscopic extension of invasive tumor: Microscopic foci within muscularis propria and submucosa. Lymph-Vascular invasion: Not identified. Peri-neural invasion: Not identified. Tumor deposit(s) (discontinuous extramural extension): No. Resection margins Proximal margin: Free of tumor. Distal margin: Free of tumor. Circumferential (radial) (posterior ascending, posterior descending; latera and posterior mid-rectum; and entire lower 1/3 rectum): Free of tumor. Mesenteric margin (sigmoid and transverse): N/A. Distance closest margin (if all above margins negative): 2.8 cm distal margin. Treatment effect (neo-adjuvant therapy): Yes. Additional polyp(s): No. Non-neoplastic findings: N/A. Lymph nodes: number examined 12; number positive: 0. Pathologic Staging: ypT2, ypN0, ypMX. Ancillary studies: Can be performed if requested. Comments: There is extensive fibrosis with dystrophic calcification which focally extends into the perirectal adipose tissue. There are microscopic foci of residual adenocarcinoma situated mainly in the muscularis propria and focally submucosa. No tumor is identified in the perirectal adipose tissue. Several of the lymph nodes have areas of fibrosis within the nodal parenchyma. (JDP:ds 03/29/16)  PATHOLOGY  REPORT: Rectum biopsy 11/23/2015 -Invasive adenocarcinoma  Microscopic comment Interval Departmental review obtained (Dr. Lyndon Code) with agreement.  RADIOGRAPHIC STUDIES: I have personally reviewed the radiological images as listed and agreed with the findings in the report. No results found.   COLONOSCOPY Dr. Paulita Fujita 11/23/2015  -A front-like villous fungating polypoid sessile and ulcerated partially obstructing large mass was found in the proximal rectum. The mass was partially circumferential (involving one half of the lumen circumference). Bruising was present. This was biopsied. -Non-thrombosed external hemorrhoids -A single nonbleeding clonic angiodysplastic lesion in the hepatic flexure and descending colon  EUS 12/09/2015 - Non-thrombosed external hemorrhoids found on perianal exam. - Malignant partially obstructing tumor in the proximal rectum. - Endosonographic images of the perirectal space were unremarkable. - No lymph nodes were seen in the perirectal region and in a peritumoral location during endosonographic examination. - Rectal mass was visualized endosonographically. A tissue diagnosis was obtained prior to this exam. This is of adenocarcinoma. This was staged uT3 uN0.   ASSESSMENT & PLAN: 65 year old male with  past medical history of hypertension, presented with intermittent rectal bleeding for 3 months.   1. Proximal rectal adenocarcinoma, uT3N0M0, stage IIA, ypT2N0M0 --I discussed his surgical pathology results with patient and his daughter in details -He has had a partial response to neoadjuvant chemoradiation, still has residual T2 disease, nodes were negative. -I recommend adjuvant Xeloda 1000 mg/m, twice daily, 2 weeks on, one week off, for total of 3-4 cycles, to reduce the risk of cancer recurrence in the future. -The patient was not happy that he needs more chemotherapy, but agrees with the plan. -I also strongly encouraged him to quit alcohol drinking and smoking,  especially during his chemotherapy -We also discussed cancer surveillance after he completes all planned treatment, which includes routine follow-up with lab and exam, and a CT scan and colonoscopy in one year.  2. HTN -Continue medication and follow up with primary care physician -His blood pressure has been normal  3. Smoking and alcohol cessation -He smokes half pack a day,I strongly encouraged him to cut back and quit -I also recommend him not to drink any alcohol when he is on chemotherapy. He agrees  Plan  -start xeloda on 10/9 -see me back on 10/26 with lab    All questions were answered. The patient knows to call the clinic with any problems, questions or concerns. I spent 20 minutes counseling the patient face to face. The total time spent in the appointment was 25 minutes and more than 50% was on counseling.     Truitt Merle, MD 04/22/2016 3:22 PM

## 2016-04-24 ENCOUNTER — Encounter: Payer: Self-pay | Admitting: Hematology

## 2016-04-25 ENCOUNTER — Telehealth: Payer: Self-pay | Admitting: *Deleted

## 2016-04-25 DIAGNOSIS — C20 Malignant neoplasm of rectum: Secondary | ICD-10-CM

## 2016-04-25 MED ORDER — CAPECITABINE 500 MG PO TABS: 1000.0000 mg/m2 | ORAL_TABLET | Freq: Two times a day (BID) | ORAL | 0 refills | Status: DC

## 2016-04-25 NOTE — Telephone Encounter (Signed)
Call from [facility] requesting clarification of Xeloda cycling. Per office note: Xeloda 2,000 mg BID for 14 days then 7 days off.  Confirmed pt needs 100 tablets for this cycle. (Has some from previous Rx)

## 2016-04-29 ENCOUNTER — Telehealth: Payer: Self-pay | Admitting: Hematology

## 2016-04-29 NOTE — Telephone Encounter (Signed)
SPOKE WITH DTR RE 10/25 LAB/FU

## 2016-05-04 ENCOUNTER — Telehealth: Payer: Self-pay | Admitting: Pharmacist

## 2016-05-04 NOTE — Telephone Encounter (Signed)
Received fax communication from [place]. Stated patient was due for a refill on his Xeloda but the rx didn't have any authorized refills remaining. Refill request form should be completed, signed by MD and returned to fax [phone]). Delivered fax to Dr. Ernestina Penna desk nurse.   Raul Del, PharmD, [place], [facility] [phone]

## 2016-05-06 ENCOUNTER — Other Ambulatory Visit: Payer: Self-pay | Admitting: *Deleted

## 2016-05-06 DIAGNOSIS — C20 Malignant neoplasm of rectum: Secondary | ICD-10-CM

## 2016-05-06 MED ORDER — CAPECITABINE 500 MG PO TABS: 1000.0000 mg/m2 | ORAL_TABLET | Freq: Two times a day (BID) | ORAL | 3 refills | Status: DC

## 2016-05-18 ENCOUNTER — Encounter: Payer: Self-pay | Admitting: Hematology

## 2016-05-18 ENCOUNTER — Telehealth: Payer: Self-pay | Admitting: Hematology

## 2016-05-18 ENCOUNTER — Ambulatory Visit (HOSPITAL_BASED_OUTPATIENT_CLINIC_OR_DEPARTMENT_OTHER): Payer: No Typology Code available for payment source | Admitting: Hematology

## 2016-05-18 ENCOUNTER — Other Ambulatory Visit (HOSPITAL_BASED_OUTPATIENT_CLINIC_OR_DEPARTMENT_OTHER): Payer: No Typology Code available for payment source

## 2016-05-18 VITALS — BP 145/78 | HR 81 | Temp 98.4°F | Resp 18 | Ht 71.0 in | Wt 207.9 lb

## 2016-05-18 DIAGNOSIS — I10 Essential (primary) hypertension: Secondary | ICD-10-CM | POA: Diagnosis not present

## 2016-05-18 DIAGNOSIS — C20 Malignant neoplasm of rectum: Secondary | ICD-10-CM | POA: Diagnosis not present

## 2016-05-18 DIAGNOSIS — Z720 Tobacco use: Secondary | ICD-10-CM

## 2016-05-18 LAB — CBC WITH DIFFERENTIAL/PLATELET
BASO%: 0.5 % (ref 0.0–2.0)
BASOS ABS: 0 10*3/uL (ref 0.0–0.1)
EOS%: 2 % (ref 0.0–7.0)
Eosinophils Absolute: 0.2 10*3/uL (ref 0.0–0.5)
HEMATOCRIT: 43.3 % (ref 38.4–49.9)
HEMOGLOBIN: 14.7 g/dL (ref 13.0–17.1)
LYMPH#: 2 10*3/uL (ref 0.9–3.3)
LYMPH%: 25.7 % (ref 14.0–49.0)
MCH: 31.7 pg (ref 27.2–33.4)
MCHC: 33.9 g/dL (ref 32.0–36.0)
MCV: 93.5 fL (ref 79.3–98.0)
MONO#: 0.6 10*3/uL (ref 0.1–0.9)
MONO%: 7.2 % (ref 0.0–14.0)
NEUT#: 5 10*3/uL (ref 1.5–6.5)
NEUT%: 64.6 % (ref 39.0–75.0)
Platelets: 355 10*3/uL (ref 140–400)
RBC: 4.64 10*6/uL (ref 4.20–5.82)
RDW: 13.3 % (ref 11.0–14.6)
WBC: 7.7 10*3/uL (ref 4.0–10.3)

## 2016-05-18 LAB — COMPREHENSIVE METABOLIC PANEL
ALBUMIN: 3.9 g/dL (ref 3.5–5.0)
ALK PHOS: 69 U/L (ref 40–150)
ALT: 15 U/L (ref 0–55)
AST: 11 U/L (ref 5–34)
Anion Gap: 10 mEq/L (ref 3–11)
BUN: 15.4 mg/dL (ref 7.0–26.0)
CALCIUM: 9.8 mg/dL (ref 8.4–10.4)
CO2: 26 mEq/L (ref 22–29)
CREATININE: 1 mg/dL (ref 0.7–1.3)
Chloride: 104 mEq/L (ref 98–109)
EGFR: 76 mL/min/{1.73_m2} — ABNORMAL LOW (ref >90)
Glucose: 115 mg/dl (ref 70–140)
Potassium: 4 mEq/L (ref 3.5–5.1)
Sodium: 139 mEq/L (ref 136–145)
Total Bilirubin: 0.44 mg/dL (ref 0.20–1.20)
Total Protein: 7.3 g/dL (ref 6.4–8.3)

## 2016-05-18 LAB — IRON AND TIBC
%SAT: 42 % (ref 20–55)
Iron: 113 ug/dL (ref 42–163)
TIBC: 269 ug/dL (ref 202–409)
UIBC: 156 ug/dL (ref 117–376)

## 2016-05-18 LAB — CEA (IN HOUSE-CHCC): CEA (CHCC-In House): 2.37 ng/mL (ref 0.00–5.00)

## 2016-05-18 LAB — FERRITIN: FERRITIN: 194 ng/mL (ref 22–316)

## 2016-05-18 NOTE — Telephone Encounter (Signed)
Appointments scheduled per 10/25 LOS. Patient given Avs report and calendar of future scheduled appointments.

## 2016-05-18 NOTE — Progress Notes (Signed)
[place]  [phone] [phone]  Clinic Follow Up Note   Patient Care Team: No Pcp Per Patient as PCP - General (General Practice) Leighton Ruff, MD as Consulting Physician (General Surgery) 05/18/2016   CHIEF COMPLAINTS:  Follow Up rectal cancer  Oncology History   Rectal cancer [facility])   Staging form: Colon and Rectum, AJCC 7th Edition     Clinical stage from 12/09/2015: Stage IIA (T3, N0, M0) - Signed by Truitt Merle, MD on 12/22/2015       Rectal cancer ([place])   11/23/2015 Initial Diagnosis    Rectal cancer ([place])      11/23/2015 Procedure    Colonoscopy showed a large fungating ulcerated partially obstructing mass in the proximal rectum. This was biopsied.      11/23/2015 Initial Biopsy    Rectal mass biopsy showed adenocarcinoma      11/25/2015 Imaging    CT chest, abdomen and pelvis with contrast showed focal wall thickening involving the low rectum, no findings suspicious for metastatic disease.      12/09/2015 Procedure    EUS showed a T3 N0 lesion in proximal rectum.      12/22/2015 - 02/01/2016 Radiation Therapy    New adjuvant irradiation to the rectal cancer      12/22/2015 - 02/01/2016 Chemotherapy    Neoadjuvant Xeloda 2000mg  in AM, and 1500mg  in PM with concurrent radiation       03/25/2016 Surgery    Robotic-assisted anterior resection of proximal rectal cancer      03/25/2016 Pathology Results    Rectal sigmoid segmental resection showed adenocarcinoma with muscularis propria and aubmucosa invasion, margins were negative, extensive fibrosis, 12 lymph nodes were negative.       HISTORY OF PRESENTING ILLNESS (12/07/2015):  David Jacobs 65 y.o. male is here because of His recently diagnosed rectal cancer. He is accompanied by an interpreter and daughter-in-law [name],  to the clinic today.  He has been having rectal bleeding for 3 months, stable overall, small amount, usually once a day, no pain, he otherwise feels well, no  fatigue or weight change lately. He was referred by his primary care physician to gastroenterologist Dr. Paulita Fujita, and underwent colonoscopy on 11/23/2015. It showed a fungating ulcerated partially obstructing mass in the proximal rectum, They showed adenocarcinoma. CT scan revealed no evidence of distant metastasis.He was referred to [place] for further management.  He works for the [employer]still works full-time. He livers with his wife, one of his son and his family. He never had a colonoscopy before he reason one. No family history of colon cancer.    CURRENT THERAPY: Adjuvant chemo Xeloda 2000 mg twice daily, 2 weeks on, one week off, started on 05/04/2016  INTERIM HISTORY: Mr. Yatsko returns for follow-up. He just completed the first cycle adjuvant Xeloda yesterday. He targeted well overall, he still has mild rectal pressure, and frequent bowel movement when he walks around. He has mild urinary frequency, no dysuria, he has good appetite and energy level, tolerating routine activities well, no other new complaints  MEDICAL HISTORY:  Past Medical History:  Diagnosis Date  . Hypertension     SURGICAL HISTORY: Past Surgical History:  Procedure Laterality Date  . EUS N/A 12/09/2015   Procedure: LOWER ENDOSCOPIC ULTRASOUND (EUS);  Surgeon: [name], MD;  Location: [facility];  Service: Endoscopy;  Laterality: N/A;  . XI ROBOTIC ASSISTED LOWER ANTERIOR RESECTION N/A 03/25/2016   Procedure: XI ROBOTIC ASSISTED LOWER ANTERIOR RESECTION;  Surgeon: Elmo Putt  Marcello Moores, MD;  Location: [facility];  Service: General;  Laterality: N/A;    SOCIAL HISTORY: Social History   Social History  . Marital status: Married    Spouse name: N/A  . Number of children: 2  . Years of education: N/A   Occupational History  . Not on file.   Social History Main Topics  . Smoking status: Current Every Day Smoker    Packs/day: 0.50    Years: 35.00  . Smokeless tobacco: Never Used  . Alcohol use No  . Drug  use: No  . Sexual activity: Not on file   Other Topics Concern  . Not on file   Social History Narrative   Married, lives w/his wife and a son--minimal English   Works full time at [employer]   Current everyday smoker, with no plans to quit   Primary contact is daughter-in-law, Jaysen Venner (can interpret for him)    FAMILY HISTORY: Family History  Problem Relation Age of Onset  . Cancer Mother 79    throid cancer   . Hyperlipidemia Mother   . Diabetes Father     ALLERGIES:  has No Known Allergies.  MEDICATIONS:  Current Outpatient Prescriptions  Medication Sig Dispense Refill  . capecitabine (XELODA) 500 MG tablet Take 4 tablets (2,000 mg total) by mouth 2 (two) times daily after a meal. For 14 days then 7 days off. 110 tablet 3  . losartan-hydrochlorothiazide (HYZAAR) 100-25 MG tablet Take 1 tablet by mouth daily.    Marland Kitchen amLODipine (NORVASC) 5 MG tablet Take 1 tablet (5 mg total) by mouth daily. (Patient not taking: Reported on 05/18/2016) 30 tablet 1   No current facility-administered medications for this visit.     REVIEW OF SYSTEMS:   Constitutional: Denies fevers, chills or abnormal night sweats Eyes: Denies blurriness of vision, double vision or watery eyes Ears, nose, mouth, throat, and face: Denies mucositis or sore throat Respiratory: Denies cough, dyspnea or wheezes Cardiovascular: Denies palpitation, chest discomfort or lower extremity swelling Gastrointestinal:  Denies nausea, heartburn or change in bowel habits Skin: Denies abnormal skin rashes Lymphatics: Denies new lymphadenopathy or easy bruising Neurological:Denies numbness, tingling or new weaknesses Behavioral/Psych: Mood is stable, no new changes  All other systems were reviewed with the patient and are negative.  PHYSICAL EXAMINATION: ECOG PERFORMANCE STATUS: 1  Vitals:   05/18/16 1154  BP: (!) 145/78  Pulse: 81  Resp: 18  Temp: 98.4 F (36.9 C)   Filed Weights   05/18/16 1154    Weight: 207 lb 14.4 oz (94.3 kg)    GENERAL:alert, no distress and comfortable SKIN: skin color, texture, turgor are normal, no rashes or significant lesions EYES: normal, conjunctiva are pink and non-injected, sclera clear OROPHARYNX:no exudate, no erythema and lips, buccal mucosa, and tongue normal  NECK: supple, thyroid normal size, non-tender, without nodularity LYMPH:  no palpable lymphadenopathy in the cervical, axillary or inguinal LUNGS: clear to auscultation and percussion with normal breathing effort HEART: regular rate & rhythm and no murmurs and no lower extremity edema ABDOMEN:abdomen soft, non-tender and normal bowel sounds, no organomegaly, multiple small surgical scars have healed well.  Musculoskeletal:no cyanosis of digits and no clubbing  PSYCH: alert & oriented x 3 with fluent speech NEURO: no focal motor/sensory deficits  LABORATORY DATA:  I have reviewed the data as listed CBC Latest Ref Rng & Units 05/18/2016 04/22/2016 03/28/2016  WBC 4.0 - 10.3 10e3/uL 7.7 10.0 8.5  Hemoglobin 13.0 - 17.1 g/dL 14.7 15.1 13.2  Hematocrit 38.4 - 49.9 % 43.3 43.4 37.4(L)  Platelets 140 - 400 10e3/uL 355 339 298    CMP Latest Ref Rng & Units 05/18/2016 04/22/2016 03/28/2016  Glucose 70 - 140 mg/dl 115 108 126(H)  BUN 7.0 - 26.0 mg/dL 15.4 18.5 9  Creatinine 0.7 - 1.3 mg/dL 1.0 1.2 0.96  Sodium 136 - 145 mEq/L 139 140 139  Potassium 3.5 - 5.1 mEq/L 4.0 4.5 3.6  Chloride 101 - 111 mmol/L - - 107  CO2 22 - 29 mEq/L 26 25 25   Calcium 8.4 - 10.4 mg/dL 9.8 10.1 8.8(L)  Total Protein 6.4 - 8.3 g/dL 7.3 7.7 -  Total Bilirubin 0.20 - 1.20 mg/dL 0.44 0.40 -  Alkaline Phos 40 - 150 U/L 69 82 -  AST 5 - 34 U/L 11 11 -  ALT 0 - 55 U/L 15 17 -   Diagnosis 03/25/2016 1. Colon, segmental resection for tumor, rectosigmoid - ADENOCARCINOMA WITHIN MUSCULARIS PROPRIA AND FOCALLY SUBMUCOSA. - MARGINS NOT INVOLVED. - EXTENSIVE FIBROSIS WITH DYSTROPHIC CALCIFICATIONS. - TWELVE BENIGN LYMPH NODES  (0/12). - SEE ONCOLOGY TABLE BELOW. 2. Colon, resection margin (donut), final distal - BENIGN COLON. - NO EVIDENCE OF MALIGNANCY.  Microscopic Comment 1. COLON AND RECTUM (INCLUDING TRANS-ANAL RESECTION): Specimen: Rectosigmoid colon. Procedure: Resection. Tumor site: Proximal rectum, right lateral wall. Specimen integrity: Intact. Macroscopic intactness of mesorectum: Complete. Macroscopic tumor perforation: No. Invasive tumor: Maximum size: 0.4 cm, see comment. Histologic type(s): Colorectal adenocarcinoma. Histologic grade and differentiation: G3: poorly differentiated/high grade. Type of polyp in which invasive carcinoma arose: No residual polyp. Microscopic extension of invasive tumor: Microscopic foci within muscularis propria and submucosa. Lymph-Vascular invasion: Not identified. Peri-neural invasion: Not identified. Tumor deposit(s) (discontinuous extramural extension): No. Resection margins Proximal margin: Free of tumor. Distal margin: Free of tumor. Circumferential (radial) (posterior ascending, posterior descending; latera and posterior mid-rectum; and entire lower 1/3 rectum): Free of tumor. Mesenteric margin (sigmoid and transverse): N/A. Distance closest margin (if all above margins negative): 2.8 cm distal margin. Treatment effect (neo-adjuvant therapy): Yes. Additional polyp(s): No. Non-neoplastic findings: N/A. Lymph nodes: number examined 12; number positive: 0. Pathologic Staging: ypT2, ypN0, ypMX. Ancillary studies: Can be performed if requested. Comments: There is extensive fibrosis with dystrophic calcification which focally extends into the perirectal adipose tissue. There are microscopic foci of residual adenocarcinoma situated mainly in the muscularis propria and focally submucosa. No tumor is identified in the perirectal adipose tissue. Several of the lymph nodes have areas of fibrosis within the nodal parenchyma. (JDP:ds 03/29/16)  PATHOLOGY  REPORT: Rectum biopsy 11/23/2015 -Invasive adenocarcinoma  Microscopic comment Interval Departmental review obtained (Dr. Lyndon Code) with agreement.  RADIOGRAPHIC STUDIES: I have personally reviewed the radiological images as listed and agreed with the findings in the report. No results found.   COLONOSCOPY Dr. Paulita Fujita 11/23/2015  -A front-like villous fungating polypoid sessile and ulcerated partially obstructing large mass was found in the proximal rectum. The mass was partially circumferential (involving one half of the lumen circumference). Bruising was present. This was biopsied. -Non-thrombosed external hemorrhoids -A single nonbleeding clonic angiodysplastic lesion in the hepatic flexure and descending colon  EUS 12/09/2015 - Non-thrombosed external hemorrhoids found on perianal exam. - Malignant partially obstructing tumor in the proximal rectum. - Endosonographic images of the perirectal space were unremarkable. - No lymph nodes were seen in the perirectal region and in a peritumoral location during endosonographic examination. - Rectal mass was visualized endosonographically. A tissue diagnosis was obtained prior to this exam. This is of adenocarcinoma. This  was staged uT3 uN0.   ASSESSMENT & PLAN: 65 year old male with past medical history of hypertension, presented with intermittent rectal bleeding for 3 months.   1. Proximal rectal adenocarcinoma, uT3N0M0, stage IIA, ypT2N0M0 --I discussed his surgical pathology results with patient and his daughter in details -He has had a partial response to neoadjuvant chemoradiation, still has residual T2 disease, nodes were negative. -I recommend adjuvant Xeloda 1000 mg/m, twice daily, 2 weeks on, one week off, for total of 3-4 cycles, to reduce the risk of cancer recurrence in the future. -The patient was not happy that he needs more chemotherapy, but agrees with the plan. -He is tolerating Xeloda well, lab reviewed with him, adequate for  treatment, we'll start cycle 2 in one week. -We also discussed cancer surveillance after he completes all planned treatment, which includes routine follow-up with lab and exam, and a CT scan and colonoscopy in one year.  2. HTN -Continue medication and follow up with primary care physician -His blood pressure has been normal  3. Smoking and alcohol cessation -He smokes half pack a day,I strongly encouraged him to cut back and quit -I also recommend him not to drink any alcohol when he is on chemotherapy. He agrees  Plan  -start xeloda on 11/1 -I'll see him back in 3 weeks with left   All questions were answered. The patient knows to call the clinic with any problems, questions or concerns. I spent 20 minutes counseling the patient face to face. The total time spent in the appointment was 25 minutes and more than 50% was on counseling.     Truitt Merle, MD 05/18/2016 12:20 PM

## 2016-05-19 LAB — CEA: CEA1: 2.2 ng/mL (ref 0.0–4.7)

## 2016-05-22 ENCOUNTER — Encounter: Payer: Self-pay | Admitting: Hematology

## 2016-05-25 ENCOUNTER — Telehealth: Payer: Self-pay

## 2016-05-25 NOTE — Telephone Encounter (Signed)
Faxed paperwork to [facility]

## 2016-06-06 NOTE — Progress Notes (Signed)
[place]  [phone] [phone]  Clinic Follow Up Note   Patient Care Team: No Pcp Per Patient as PCP - General (General Practice) Leighton Ruff, MD as Consulting Physician (General Surgery) 06/08/2016   CHIEF COMPLAINTS:  Follow Up rectal cancer  Oncology History   Rectal cancer [facility])   Staging form: Colon and Rectum, AJCC 7th Edition   - Clinical stage from 12/09/2015: Stage IIA (T3, N0, M0) - Signed by Truitt Merle, MD on 12/22/2015   - Pathologic stage from 03/25/2016: Stage I (yT2, N0, cM0) - Signed by Truitt Merle, MD on 04/22/2016       Rectal cancer ([place])   11/23/2015 Initial Diagnosis    Rectal cancer ([place])      11/23/2015 Procedure    Colonoscopy showed a large fungating ulcerated partially obstructing mass in the proximal rectum. This was biopsied.      11/23/2015 Initial Biopsy    Rectal mass biopsy showed adenocarcinoma      11/25/2015 Imaging    CT chest, abdomen and pelvis with contrast showed focal wall thickening involving the low rectum, no findings suspicious for metastatic disease.      12/09/2015 Procedure    EUS showed a T3 N0 lesion in proximal rectum.      12/22/2015 - 02/01/2016 Radiation Therapy    New adjuvant irradiation to the rectal cancer      12/22/2015 - 02/01/2016 Chemotherapy    Neoadjuvant Xeloda 2000mg  in AM, and 1500mg  in PM with concurrent radiation       03/25/2016 Surgery    Robotic-assisted anterior resection of proximal rectal cancer      03/25/2016 Pathology Results    Rectal sigmoid segmental resection showed adenocarcinoma with muscularis propria and aubmucosa invasion, margins were negative, extensive fibrosis, 12 lymph nodes were negative.      05/04/2016 -  Chemotherapy    Adjuvant Xeloda 2000mg  bid, 2 weeks on and one week off         HISTORY OF PRESENTING ILLNESS (12/07/2015):  David Jacobs 65 y.o. male is here because of His recently diagnosed rectal cancer. He is accompanied by an  interpreter and daughter-in-law [name],  to the clinic today.  He has been having rectal bleeding for 3 months, stable overall, small amount, usually once a day, no pain, he otherwise feels well, no fatigue or weight change lately. He was referred by his primary care physician to gastroenterologist Dr. Paulita Fujita, and underwent colonoscopy on 11/23/2015. It showed a fungating ulcerated partially obstructing mass in the proximal rectum, They showed adenocarcinoma. CT scan revealed no evidence of distant metastasis.He was referred to [place] for further management.  He works for the [employer]still works full-time. He livers with his wife, one of his son and his family. He never had a colonoscopy before he reason one. No family history of colon cancer.    CURRENT THERAPY: Adjuvant chemo Xeloda 2000 mg twice daily, 2 weeks on, one week off, started on 05/04/2016  INTERIM HISTORY: [name] returns for follow-up. He just completed the second cycle adjuvant Xeloda yesterday. He is doing well overall, still has loose BM 10 times a day, no abdominal pain, nausea, or other symptoms. No hematochezia or melena. He otherwise doing well, mild fatigue, able to tolerate a routine activities. No other new complaints.  MEDICAL HISTORY:  Past Medical History:  Diagnosis Date  . Hypertension     SURGICAL HISTORY: Past Surgical History:  Procedure Laterality Date  . EUS N/A 12/09/2015  Procedure: LOWER ENDOSCOPIC ULTRASOUND (EUS);  Surgeon: [name], MD;  Location: [facility];  Service: Endoscopy;  Laterality: N/A;  . XI ROBOTIC ASSISTED LOWER ANTERIOR RESECTION N/A 03/25/2016   Procedure: XI ROBOTIC ASSISTED LOWER ANTERIOR RESECTION;  Surgeon: Leighton Ruff, MD;  Location: [facility];  Service: General;  Laterality: N/A;    SOCIAL HISTORY: Social History   Social History  . Marital status: Married    Spouse name: N/A  . Number of children: 2  . Years of education: N/A   Occupational History  . Not  on file.   Social History Main Topics  . Smoking status: Current Every Day Smoker    Packs/day: 0.50    Years: 35.00  . Smokeless tobacco: Never Used  . Alcohol use No  . Drug use: No  . Sexual activity: Not on file   Other Topics Concern  . Not on file   Social History Narrative   Married, lives w/his wife and a son--minimal English   Works full time at [employer]   Current everyday smoker, with no plans to quit   Primary contact is daughter-in-law, Victory Feickert (can interpret for him)    FAMILY HISTORY: Family History  Problem Relation Age of Onset  . Cancer Mother 47    throid cancer   . Hyperlipidemia Mother   . Diabetes Father     ALLERGIES:  has No Known Allergies.  MEDICATIONS:  Current Outpatient Prescriptions  Medication Sig Dispense Refill  . amLODipine (NORVASC) 5 MG tablet Take 1 tablet (5 mg total) by mouth daily. (Patient not taking: Reported on 05/18/2016) 30 tablet 1  . capecitabine (XELODA) 500 MG tablet Take 4 tablets (2,000 mg total) by mouth 2 (two) times daily after a meal. For 14 days then 7 days off. 110 tablet 3  . diphenoxylate-atropine (LOMOTIL) 2.5-0.025 MG tablet Take 1 tablet by mouth 4 (four) times daily as needed for diarrhea or loose stools. 30 tablet 0  . losartan-hydrochlorothiazide (HYZAAR) 100-25 MG tablet Take 1 tablet by mouth daily.     No current facility-administered medications for this visit.     REVIEW OF SYSTEMS:   Constitutional: Denies fevers, chills or abnormal night sweats Eyes: Denies blurriness of vision, double vision or watery eyes Ears, nose, mouth, throat, and face: Denies mucositis or sore throat Respiratory: Denies cough, dyspnea or wheezes Cardiovascular: Denies palpitation, chest discomfort or lower extremity swelling Gastrointestinal:  Denies nausea, heartburn or change in bowel habits Skin: Denies abnormal skin rashes Lymphatics: Denies new lymphadenopathy or easy bruising Neurological:Denies  numbness, tingling or new weaknesses Behavioral/Psych: Mood is stable, no new changes  All other systems were reviewed with the patient and are negative.  PHYSICAL EXAMINATION: ECOG PERFORMANCE STATUS: 1  Vitals:   06/08/16 0918  BP: (!) 170/65  Pulse: 75  Resp: 18  Temp: 98 F (36.7 C)   Filed Weights   06/08/16 0918  Weight: 211 lb 11.2 oz (96 kg)    GENERAL:alert, no distress and comfortable SKIN: skin color, texture, turgor are normal, no rashes or significant lesions EYES: normal, conjunctiva are pink and non-injected, sclera clear OROPHARYNX:no exudate, no erythema and lips, buccal mucosa, and tongue normal  NECK: supple, thyroid normal size, non-tender, without nodularity LYMPH:  no palpable lymphadenopathy in the cervical, axillary or inguinal LUNGS: clear to auscultation and percussion with normal breathing effort HEART: regular rate & rhythm and no murmurs and no lower extremity edema ABDOMEN:abdomen soft, non-tender and normal bowel sounds, no organomegaly, multiple  small surgical scars have healed well.  Musculoskeletal:no cyanosis of digits and no clubbing  PSYCH: alert & oriented x 3 with fluent speech NEURO: no focal motor/sensory deficits  LABORATORY DATA:  I have reviewed the data as listed CBC Latest Ref Rng & Units 06/08/2016 05/18/2016 04/22/2016  WBC 4.0 - 10.3 10e3/uL 5.6 7.7 10.0  Hemoglobin 13.0 - 17.1 g/dL 13.5 14.7 15.1  Hematocrit 38.4 - 49.9 % 38.5 43.3 43.4  Platelets 140 - 400 10e3/uL 278 355 339    CMP Latest Ref Rng & Units 06/08/2016 05/18/2016 04/22/2016  Glucose 70 - 140 mg/dl 138 115 108  BUN 7.0 - 26.0 mg/dL 20.6 15.4 18.5  Creatinine 0.7 - 1.3 mg/dL 1.1 1.0 1.2  Sodium 136 - 145 mEq/L 138 139 140  Potassium 3.5 - 5.1 mEq/L 3.7 4.0 4.5  Chloride 101 - 111 mmol/L - - -  CO2 22 - 29 mEq/L 25 26 25   Calcium 8.4 - 10.4 mg/dL 9.9 9.8 10.1  Total Protein 6.4 - 8.3 g/dL 6.9 7.3 7.7  Total Bilirubin 0.20 - 1.20 mg/dL 0.57 0.44 0.40    Alkaline Phos 40 - 150 U/L 61 69 82  AST 5 - 34 U/L 13 11 11   ALT 0 - 55 U/L 12 15 17    Diagnosis 03/25/2016 1. Colon, segmental resection for tumor, rectosigmoid - ADENOCARCINOMA WITHIN MUSCULARIS PROPRIA AND FOCALLY SUBMUCOSA. - MARGINS NOT INVOLVED. - EXTENSIVE FIBROSIS WITH DYSTROPHIC CALCIFICATIONS. - TWELVE BENIGN LYMPH NODES (0/12). - SEE ONCOLOGY TABLE BELOW. 2. Colon, resection margin (donut), final distal - BENIGN COLON. - NO EVIDENCE OF MALIGNANCY.  Microscopic Comment 1. COLON AND RECTUM (INCLUDING TRANS-ANAL RESECTION): Specimen: Rectosigmoid colon. Procedure: Resection. Tumor site: Proximal rectum, right lateral wall. Specimen integrity: Intact. Macroscopic intactness of mesorectum: Complete. Macroscopic tumor perforation: No. Invasive tumor: Maximum size: 0.4 cm, see comment. Histologic type(s): Colorectal adenocarcinoma. Histologic grade and differentiation: G3: poorly differentiated/high grade. Type of polyp in which invasive carcinoma arose: No residual polyp. Microscopic extension of invasive tumor: Microscopic foci within muscularis propria and submucosa. Lymph-Vascular invasion: Not identified. Peri-neural invasion: Not identified. Tumor deposit(s) (discontinuous extramural extension): No. Resection margins Proximal margin: Free of tumor. Distal margin: Free of tumor. Circumferential (radial) (posterior ascending, posterior descending; latera and posterior mid-rectum; and entire lower 1/3 rectum): Free of tumor. Mesenteric margin (sigmoid and transverse): N/A. Distance closest margin (if all above margins negative): 2.8 cm distal margin. Treatment effect (neo-adjuvant therapy): Yes. Additional polyp(s): No. Non-neoplastic findings: N/A. Lymph nodes: number examined 12; number positive: 0. Pathologic Staging: ypT2, ypN0, ypMX. Ancillary studies: Can be performed if requested. Comments: There is extensive fibrosis with dystrophic calcification which  focally extends into the perirectal adipose tissue. There are microscopic foci of residual adenocarcinoma situated mainly in the muscularis propria and focally submucosa. No tumor is identified in the perirectal adipose tissue. Several of the lymph nodes have areas of fibrosis within the nodal parenchyma. (JDP:ds 03/29/16)  PATHOLOGY REPORT: Rectum biopsy 11/23/2015 -Invasive adenocarcinoma  Microscopic comment Interval Departmental review obtained (Dr. Lyndon Code) with agreement.  RADIOGRAPHIC STUDIES: I have personally reviewed the radiological images as listed and agreed with the findings in the report. No results found.   COLONOSCOPY Dr. Paulita Fujita 11/23/2015  -A front-like villous fungating polypoid sessile and ulcerated partially obstructing large mass was found in the proximal rectum. The mass was partially circumferential (involving one half of the lumen circumference). Bruising was present. This was biopsied. -Non-thrombosed external hemorrhoids -A single nonbleeding clonic angiodysplastic lesion in the hepatic flexure and  descending colon  EUS 12/09/2015 - Non-thrombosed external hemorrhoids found on perianal exam. - Malignant partially obstructing tumor in the proximal rectum. - Endosonographic images of the perirectal space were unremarkable. - No lymph nodes were seen in the perirectal region and in a peritumoral location during endosonographic examination. - Rectal mass was visualized endosonographically. A tissue diagnosis was obtained prior to this exam. This is of adenocarcinoma. This was staged uT3 uN0.   ASSESSMENT & PLAN: 65 year old male with past medical history of hypertension, presented with intermittent rectal bleeding for 3 months.   1. Proximal rectal adenocarcinoma, uT3N0M0, stage IIA, ypT2N0M0 --I previously discussed his surgical pathology results with patient and his daughter in details -He has had a partial response to neoadjuvant chemoradiation, still has residual  T2 disease, nodes were negative. -I recommend adjuvant Xeloda 1000 mg/m, twice daily, 2 weeks on, one week off, for total of 3-4 cycles, to reduce the risk of cancer recurrence in the future. -The patient was not happy that he needs more chemotherapy, but agrees with the plan. -He is tolerating Xeloda well, lab reviewed with him, adequate for treatment, we'll start cycle 3 in one week. -We also discussed cancer surveillance after he completes all planned treatment, which includes routine follow-up with lab and exam, and a CT scan and colonoscopy in one year.  2. HTN -Continue medication and follow up with primary care physician -His blood pressure has been high lately, I encourage him to monitor at home -he will restart Norvasc   3. Smoking and alcohol cessation -He smokes half pack a day, I strongly encouraged him to cut back and quit  -I also recommend him not to drink any alcohol when he is on chemotherapy. He agrees  4. Diarrhea  -Probably related to his rectal surgery, his diarrhea has not gotten worse since he started adjuvant Xeloda -No signs of infection. -He tried Imodium, did not help much. I given him a prescription of Lomotil today  Plan  -start xeloda on 11/22 -I'll see him back in 3 weeks with lab  -Use Lomotil 1-2 tablets every 6-8 hours as needed for diarrhea -he will restart amlodipine for his uncontrolled hypertension   All questions were answered. The patient knows to call the clinic with any problems, questions or concerns.  I spent 20 minutes counseling the patient face to face. The total time spent in the appointment was 25 minutes and more than 50% was on counseling.     Truitt Merle, MD 06/08/2016 10:11 AM

## 2016-06-08 ENCOUNTER — Telehealth: Payer: Self-pay | Admitting: Hematology

## 2016-06-08 ENCOUNTER — Encounter: Payer: Self-pay | Admitting: Hematology

## 2016-06-08 ENCOUNTER — Other Ambulatory Visit: Payer: Self-pay | Admitting: Hematology

## 2016-06-08 ENCOUNTER — Ambulatory Visit (HOSPITAL_BASED_OUTPATIENT_CLINIC_OR_DEPARTMENT_OTHER): Payer: No Typology Code available for payment source | Admitting: Hematology

## 2016-06-08 ENCOUNTER — Telehealth: Payer: Self-pay | Admitting: *Deleted

## 2016-06-08 ENCOUNTER — Other Ambulatory Visit (HOSPITAL_BASED_OUTPATIENT_CLINIC_OR_DEPARTMENT_OTHER): Payer: No Typology Code available for payment source

## 2016-06-08 VITALS — BP 170/65 | HR 75 | Temp 98.0°F | Resp 18 | Ht 71.0 in | Wt 211.7 lb

## 2016-06-08 DIAGNOSIS — C20 Malignant neoplasm of rectum: Secondary | ICD-10-CM

## 2016-06-08 DIAGNOSIS — Z23 Encounter for immunization: Secondary | ICD-10-CM

## 2016-06-08 DIAGNOSIS — I10 Essential (primary) hypertension: Secondary | ICD-10-CM

## 2016-06-08 LAB — COMPREHENSIVE METABOLIC PANEL
ALBUMIN: 3.8 g/dL (ref 3.5–5.0)
ALK PHOS: 61 U/L (ref 40–150)
ALT: 12 U/L (ref 0–55)
AST: 13 U/L (ref 5–34)
Anion Gap: 10 mEq/L (ref 3–11)
BUN: 20.6 mg/dL (ref 7.0–26.0)
CALCIUM: 9.9 mg/dL (ref 8.4–10.4)
CHLORIDE: 103 meq/L (ref 98–109)
CO2: 25 mEq/L (ref 22–29)
Creatinine: 1.1 mg/dL (ref 0.7–1.3)
EGFR: 70 mL/min/{1.73_m2} — AB (ref >90)
GLUCOSE: 138 mg/dL (ref 70–140)
POTASSIUM: 3.7 meq/L (ref 3.5–5.1)
SODIUM: 138 meq/L (ref 136–145)
Total Bilirubin: 0.57 mg/dL (ref 0.20–1.20)
Total Protein: 6.9 g/dL (ref 6.4–8.3)

## 2016-06-08 LAB — CBC WITH DIFFERENTIAL/PLATELET
BASO%: 0.5 % (ref 0.0–2.0)
BASOS ABS: 0 10*3/uL (ref 0.0–0.1)
EOS ABS: 0.2 10*3/uL (ref 0.0–0.5)
EOS%: 2.9 % (ref 0.0–7.0)
HCT: 38.5 % (ref 38.4–49.9)
HEMOGLOBIN: 13.5 g/dL (ref 13.0–17.1)
LYMPH%: 30.1 % (ref 14.0–49.0)
MCH: 32.2 pg (ref 27.2–33.4)
MCHC: 35.1 g/dL (ref 32.0–36.0)
MCV: 91.9 fL (ref 79.3–98.0)
MONO#: 0.5 10*3/uL (ref 0.1–0.9)
MONO%: 8.6 % (ref 0.0–14.0)
NEUT#: 3.3 10*3/uL (ref 1.5–6.5)
NEUT%: 57.9 % (ref 39.0–75.0)
Platelets: 278 10*3/uL (ref 140–400)
RBC: 4.19 10*6/uL — ABNORMAL LOW (ref 4.20–5.82)
RDW: 15.7 % — AB (ref 11.0–14.6)
WBC: 5.6 10*3/uL (ref 4.0–10.3)
lymph#: 1.7 10*3/uL (ref 0.9–3.3)

## 2016-06-08 MED ORDER — DIPHENOXYLATE-ATROPINE 2.5-0.025 MG PO TABS: 1.0000 | ORAL_TABLET | Freq: Four times a day (QID) | ORAL | 0 refills | Status: DC | PRN

## 2016-06-08 MED ORDER — CAPECITABINE 500 MG PO TABS: 1000.0000 mg/m2 | ORAL_TABLET | Freq: Two times a day (BID) | ORAL | 1 refills | Status: DC

## 2016-06-08 MED ORDER — INFLUENZA VAC SPLIT QUAD 0.5 ML IM SUSY
0.5000 mL | PREFILLED_SYRINGE | Freq: Once | INTRAMUSCULAR | Status: AC
  Administered 2016-06-08: 0.5 mL via INTRAMUSCULAR
  Filled 2016-06-08: qty 0.5

## 2016-06-08 NOTE — Telephone Encounter (Signed)
Gave patient avs report and appointments for December  °

## 2016-06-08 NOTE — Telephone Encounter (Signed)
Xeloda script faxed to [facility] @ [phone]

## 2016-06-14 ENCOUNTER — Telehealth: Payer: Self-pay | Admitting: *Deleted

## 2016-06-14 NOTE — Telephone Encounter (Signed)
"  Pamala Hurry with [employer] calling to learn when this patient was last seen and when is the next scheduled appointment." Provided most recent past and coming appointment for [facility].

## 2016-06-28 ENCOUNTER — Encounter: Payer: Self-pay | Admitting: Hematology

## 2016-06-28 ENCOUNTER — Telehealth: Payer: Self-pay | Admitting: Hematology

## 2016-06-28 ENCOUNTER — Ambulatory Visit (HOSPITAL_BASED_OUTPATIENT_CLINIC_OR_DEPARTMENT_OTHER): Payer: No Typology Code available for payment source | Admitting: Hematology

## 2016-06-28 ENCOUNTER — Other Ambulatory Visit (HOSPITAL_BASED_OUTPATIENT_CLINIC_OR_DEPARTMENT_OTHER): Payer: No Typology Code available for payment source

## 2016-06-28 VITALS — BP 142/69 | HR 71 | Temp 98.3°F | Resp 17 | Ht 71.0 in | Wt 213.5 lb

## 2016-06-28 DIAGNOSIS — C20 Malignant neoplasm of rectum: Secondary | ICD-10-CM

## 2016-06-28 DIAGNOSIS — F17210 Nicotine dependence, cigarettes, uncomplicated: Secondary | ICD-10-CM | POA: Diagnosis not present

## 2016-06-28 DIAGNOSIS — I10 Essential (primary) hypertension: Secondary | ICD-10-CM

## 2016-06-28 DIAGNOSIS — R197 Diarrhea, unspecified: Secondary | ICD-10-CM

## 2016-06-28 LAB — CBC WITH DIFFERENTIAL/PLATELET
BASO%: 0.9 % (ref 0.0–2.0)
Basophils Absolute: 0.1 10*3/uL (ref 0.0–0.1)
EOS%: 4.7 % (ref 0.0–7.0)
Eosinophils Absolute: 0.3 10*3/uL (ref 0.0–0.5)
HCT: 39.6 % (ref 38.4–49.9)
HGB: 13.3 g/dL (ref 13.0–17.1)
LYMPH%: 27.5 % (ref 14.0–49.0)
MCH: 32.1 pg (ref 27.2–33.4)
MCHC: 33.6 g/dL (ref 32.0–36.0)
MCV: 95.6 fL (ref 79.3–98.0)
MONO#: 0.6 10*3/uL (ref 0.1–0.9)
MONO%: 8.2 % (ref 0.0–14.0)
NEUT#: 4.1 10*3/uL (ref 1.5–6.5)
NEUT%: 58.7 % (ref 39.0–75.0)
PLATELETS: 271 10*3/uL (ref 140–400)
RBC: 4.14 10*6/uL — AB (ref 4.20–5.82)
RDW: 17.6 % — ABNORMAL HIGH (ref 11.0–14.6)
WBC: 7 10*3/uL (ref 4.0–10.3)
lymph#: 1.9 10*3/uL (ref 0.9–3.3)

## 2016-06-28 LAB — COMPREHENSIVE METABOLIC PANEL
ALT: 14 U/L (ref 0–55)
ANION GAP: 9 meq/L (ref 3–11)
AST: 15 U/L (ref 5–34)
Albumin: 3.9 g/dL (ref 3.5–5.0)
Alkaline Phosphatase: 62 U/L (ref 40–150)
BUN: 17.4 mg/dL (ref 7.0–26.0)
CHLORIDE: 103 meq/L (ref 98–109)
CO2: 26 meq/L (ref 22–29)
CREATININE: 1 mg/dL (ref 0.7–1.3)
Calcium: 9.8 mg/dL (ref 8.4–10.4)
EGFR: 74 mL/min/{1.73_m2} — ABNORMAL LOW (ref >90)
GLUCOSE: 113 mg/dL (ref 70–140)
Potassium: 4.3 mEq/L (ref 3.5–5.1)
SODIUM: 139 meq/L (ref 136–145)
Total Bilirubin: 0.77 mg/dL (ref 0.20–1.20)
Total Protein: 7.1 g/dL (ref 6.4–8.3)

## 2016-06-28 MED ORDER — DIPHENOXYLATE-ATROPINE 2.5-0.025 MG PO TABS: 1.0000 | ORAL_TABLET | Freq: Four times a day (QID) | ORAL | 1 refills | Status: DC | PRN

## 2016-06-28 NOTE — Telephone Encounter (Signed)
Appointments scheduled per 1/25 LOS. Patient given AVS report and calendars with future scheduled appointments. °

## 2016-06-28 NOTE — Progress Notes (Signed)
[place]  [phone] [phone]  Clinic Follow Up Note   Patient Care Team: No Pcp Per Patient as PCP - General (General Practice) David Ruff, MD as Consulting Physician (General Surgery) 06/28/2016   CHIEF COMPLAINTS:  Follow Up rectal cancer  Oncology History   Rectal cancer [facility])   Staging form: Colon and Rectum, AJCC 7th Edition   - Clinical stage from 12/09/2015: Stage IIA (T3, N0, M0) - Signed by David Merle, MD on 12/22/2015   - Pathologic stage from 03/25/2016: Stage I (yT2, N0, cM0) - Signed by David Merle, MD on 04/22/2016       Rectal cancer ([place])   11/23/2015 Initial Diagnosis    Rectal cancer ([place])      11/23/2015 Procedure    Colonoscopy showed a large fungating ulcerated partially obstructing mass in the proximal rectum. This was biopsied.      11/23/2015 Initial Biopsy    Rectal mass biopsy showed adenocarcinoma      11/25/2015 Imaging    CT chest, abdomen and pelvis with contrast showed focal wall thickening involving the low rectum, no findings suspicious for metastatic disease.      12/09/2015 Procedure    EUS showed a T3 N0 lesion in proximal rectum.      12/22/2015 - 02/01/2016 Radiation Therapy    New adjuvant irradiation to the rectal cancer      12/22/2015 - 02/01/2016 Chemotherapy    Neoadjuvant Xeloda 2000mg  in AM, and 1500mg  in PM with concurrent radiation       03/25/2016 Surgery    Robotic-assisted anterior resection of proximal rectal cancer      03/25/2016 Pathology Results    Rectal sigmoid segmental resection showed adenocarcinoma with muscularis propria and aubmucosa invasion, margins were negative, extensive fibrosis, 12 lymph nodes were negative.      05/04/2016 -  Chemotherapy    Adjuvant Xeloda 2000mg  bid, 2 weeks on and one week off         HISTORY OF PRESENTING ILLNESS (12/07/2015):  David Jacobs 65 y.o. male is here because of His recently diagnosed rectal cancer. He is accompanied by an  interpreter and daughter-in-law David Jacobs,  to the clinic today.  He has been having rectal bleeding for 3 months, stable overall, small amount, usually once a day, no pain, he otherwise feels well, no fatigue or weight change lately. He was referred by his primary care physician to gastroenterologist Dr. Paulita Jacobs, and underwent colonoscopy on 11/23/2015. It showed a fungating ulcerated partially obstructing mass in the proximal rectum, They showed adenocarcinoma. CT scan revealed no evidence of distant metastasis.He was referred to [place] for further management.  He works for the [employer]still works full-time. He livers with his wife, one of his son and his family. He never had a colonoscopy before he reason one. No family history of colon cancer.    CURRENT THERAPY: Adjuvant chemo Xeloda 2000 mg twice daily, 2 weeks on, one week off, started on 05/04/2016  INTERIM HISTORY: David Jacobs returns for follow-up.  He will complete current cycle Xeloda in 3 days His diarrhea has much improved after taking Lomotil, he takes 1 pill at night. He has semi-formed bowel movement 4-5 times a day. He otherwise doing well, denies any pain, or other symptoms   MEDICAL HISTORY:  Past Medical History:  Diagnosis Date  . Hypertension     SURGICAL HISTORY: Past Surgical History:  Procedure Laterality Date  . EUS N/A 12/09/2015   Procedure: LOWER ENDOSCOPIC  ULTRASOUND (EUS);  Surgeon: David Silence, MD;  Location: [facility];  Service: Endoscopy;  Laterality: N/A;  . XI ROBOTIC ASSISTED LOWER ANTERIOR RESECTION N/A 03/25/2016   Procedure: XI ROBOTIC ASSISTED LOWER ANTERIOR RESECTION;  Surgeon: David Ruff, MD;  Location: [facility];  Service: General;  Laterality: N/A;    SOCIAL HISTORY: Social History   Social History  . Marital status: Married    Spouse name: N/A  . Number of children: 2  . Years of education: N/A   Occupational History  . Not on file.   Social History Main Topics  . Smoking  status: Current Every Day Smoker    Packs/day: 0.50    Years: 35.00  . Smokeless tobacco: Never Used  . Alcohol use No  . Drug use: No  . Sexual activity: Not on file   Other Topics Concern  . Not on file   Social History Narrative   Married, lives w/his wife and a son--minimal English   Works full time at [employer]   Current everyday smoker, with no plans to quit   Primary contact is daughter-in-law, David Jacobs (can interpret for him)    FAMILY HISTORY: Family History  Problem Relation Age of Onset  . Cancer Mother 69    throid cancer   . Hyperlipidemia Mother   . Diabetes Father     ALLERGIES:  has No Known Allergies.  MEDICATIONS:  Current Outpatient Prescriptions  Medication Sig Dispense Refill  . capecitabine (XELODA) 500 MG tablet Take 4 tablets (2,000 mg total) by mouth 2 (two) times daily after a meal. For 14 days then 7 days off. 112 tablet 1  . diphenoxylate-atropine (LOMOTIL) 2.5-0.025 MG tablet Take 1 tablet by mouth 4 (four) times daily as needed for diarrhea or loose stools. 30 tablet 1  . losartan-hydrochlorothiazide (HYZAAR) 100-25 MG tablet Take 1 tablet by mouth daily.    Marland Kitchen amLODipine (NORVASC) 5 MG tablet Take 1 tablet (5 mg total) by mouth daily. (Patient not taking: Reported on 06/28/2016) 30 tablet 1   No current facility-administered medications for this visit.     REVIEW OF SYSTEMS:   Constitutional: Denies fevers, chills or abnormal night sweats Eyes: Denies blurriness of vision, double vision or watery eyes Ears, nose, mouth, throat, and face: Denies mucositis or sore throat Respiratory: Denies cough, dyspnea or wheezes Cardiovascular: Denies palpitation, chest discomfort or lower extremity swelling Gastrointestinal:  Denies nausea, heartburn or change in bowel habits Skin: Denies abnormal skin rashes Lymphatics: Denies new lymphadenopathy or easy bruising Neurological:Denies numbness, tingling or new  weaknesses Behavioral/Psych: Mood is stable, no new changes  All other systems were reviewed with the patient and are negative.  PHYSICAL EXAMINATION: ECOG PERFORMANCE STATUS: 1  Vitals:   06/28/16 1415  BP: (!) 142/69  Pulse: 71  Resp: 17  Temp: 98.3 F (36.8 C)   Filed Weights   06/28/16 1415  Weight: 213 lb 8 oz (96.8 kg)    GENERAL:alert, no distress and comfortable SKIN: skin color, texture, turgor are normal, no rashes or significant lesions EYES: normal, conjunctiva are pink and non-injected, sclera clear OROPHARYNX:no exudate, no erythema and lips, buccal mucosa, and tongue normal  NECK: supple, thyroid normal size, non-tender, without nodularity LYMPH:  no palpable lymphadenopathy in the cervical, axillary or inguinal LUNGS: clear to auscultation and percussion with normal breathing effort HEART: regular rate & rhythm and no murmurs and no lower extremity edema ABDOMEN:abdomen soft, non-tender and normal bowel sounds, no organomegaly, multiple small surgical scars  have healed well.  Musculoskeletal:no cyanosis of digits and no clubbing  PSYCH: alert & oriented x 3 with fluent speech NEURO: no focal motor/sensory deficits  LABORATORY DATA:  I have reviewed the data as listed CBC Latest Ref Rng & Units 06/28/2016 06/08/2016 05/18/2016  WBC 4.0 - 10.3 10e3/uL 7.0 5.6 7.7  Hemoglobin 13.0 - 17.1 g/dL 13.3 13.5 14.7  Hematocrit 38.4 - 49.9 % 39.6 38.5 43.3  Platelets 140 - 400 10e3/uL 271 278 355    CMP Latest Ref Rng & Units 06/28/2016 06/08/2016 05/18/2016  Glucose 70 - 140 mg/dl 113 138 115  BUN 7.0 - 26.0 mg/dL 17.4 20.6 15.4  Creatinine 0.7 - 1.3 mg/dL 1.0 1.1 1.0  Sodium 136 - 145 mEq/L 139 138 139  Potassium 3.5 - 5.1 mEq/L 4.3 3.7 4.0  Chloride 101 - 111 mmol/L - - -  CO2 22 - 29 mEq/L 26 25 26   Calcium 8.4 - 10.4 mg/dL 9.8 9.9 9.8  Total Protein 6.4 - 8.3 g/dL 7.1 6.9 7.3  Total Bilirubin 0.20 - 1.20 mg/dL 0.77 0.57 0.44  Alkaline Phos 40 - 150 U/L 62  61 69  AST 5 - 34 U/L 15 13 11   ALT 0 - 55 U/L 14 12 15    Diagnosis 03/25/2016 1. Colon, segmental resection for tumor, rectosigmoid - ADENOCARCINOMA WITHIN MUSCULARIS PROPRIA AND FOCALLY SUBMUCOSA. - MARGINS NOT INVOLVED. - EXTENSIVE FIBROSIS WITH DYSTROPHIC CALCIFICATIONS. - TWELVE BENIGN LYMPH NODES (0/12). - SEE ONCOLOGY TABLE BELOW. 2. Colon, resection margin (donut), final distal - BENIGN COLON. - NO EVIDENCE OF MALIGNANCY.  Microscopic Comment 1. COLON AND RECTUM (INCLUDING TRANS-ANAL RESECTION): Specimen: Rectosigmoid colon. Procedure: Resection. Tumor site: Proximal rectum, right lateral wall. Specimen integrity: Intact. Macroscopic intactness of mesorectum: Complete. Macroscopic tumor perforation: No. Invasive tumor: Maximum size: 0.4 cm, see comment. Histologic type(s): Colorectal adenocarcinoma. Histologic grade and differentiation: G3: poorly differentiated/high grade. Type of polyp in which invasive carcinoma arose: No residual polyp. Microscopic extension of invasive tumor: Microscopic foci within muscularis propria and submucosa. Lymph-Vascular invasion: Not identified. Peri-neural invasion: Not identified. Tumor deposit(s) (discontinuous extramural extension): No. Resection margins Proximal margin: Free of tumor. Distal margin: Free of tumor. Circumferential (radial) (posterior ascending, posterior descending; latera and posterior mid-rectum; and entire lower 1/3 rectum): Free of tumor. Mesenteric margin (sigmoid and transverse): N/A. Distance closest margin (if all above margins negative): 2.8 cm distal margin. Treatment effect (neo-adjuvant therapy): Yes. Additional polyp(s): No. Non-neoplastic findings: N/A. Lymph nodes: number examined 12; number positive: 0. Pathologic Staging: ypT2, ypN0, ypMX. Ancillary studies: Can be performed if requested. Comments: There is extensive fibrosis with dystrophic calcification which focally extends into the perirectal  adipose tissue. There are microscopic foci of residual adenocarcinoma situated mainly in the muscularis propria and focally submucosa. No tumor is identified in the perirectal adipose tissue. Several of the lymph nodes have areas of fibrosis within the nodal parenchyma. (JDP:ds 03/29/16)  PATHOLOGY REPORT: Rectum biopsy 11/23/2015 -Invasive adenocarcinoma  Microscopic comment Interval Departmental review obtained (Dr. Lyndon Code) with agreement.  RADIOGRAPHIC STUDIES: I have personally reviewed the radiological images as listed and agreed with the findings in the report. No results found.   COLONOSCOPY Dr. Paulita Jacobs 11/23/2015  -A front-like villous fungating polypoid sessile and ulcerated partially obstructing large mass was found in the proximal rectum. The mass was partially circumferential (involving one half of the lumen circumference). Bruising was present. This was biopsied. -Non-thrombosed external hemorrhoids -A single nonbleeding clonic angiodysplastic lesion in the hepatic flexure and descending colon  EUS  12/09/2015 - Non-thrombosed external hemorrhoids found on perianal exam. - Malignant partially obstructing tumor in the proximal rectum. - Endosonographic images of the perirectal space were unremarkable. - No lymph nodes were seen in the perirectal region and in a peritumoral location during endosonographic examination. - Rectal mass was visualized endosonographically. A tissue diagnosis was obtained prior to this exam. This is of adenocarcinoma. This was staged uT3 uN0.   ASSESSMENT & PLAN: 65 year old male with past medical history of hypertension, presented with intermittent rectal bleeding for 3 months.   1. Proximal rectal adenocarcinoma, uT3N0M0, stage IIA, ypT2N0M0 --I previously discussed his surgical pathology results with patient and his daughter in details -He has had a partial response to neoadjuvant chemoradiation, still has residual T2 disease, nodes were negative. -I  recommend adjuvant Xeloda 1000 mg/m, twice daily, 2 weeks on, one week off, for total of 3-4 cycles, to reduce the risk of cancer recurrence in the future. -The patient was not happy that he needs more chemotherapy, but agrees with the plan. -He is tolerating Xeloda well, lab reviewed with him, adequate for treatment, we'll start cycle 4 on 12/16. -We again reviewed cancer surveillance after he completes all planned treatment, which includes routine follow-up with lab and exam every 3-4 months for the first 3 years, then every 6-12 months, for total 5 years, and a CT scan and colonoscopy in one year.  2. HTN -Continue medication and follow up with primary care physician -His blood pressure has been high lately, I encourage him to monitor at home -he will restart Norvasc   3. Smoking and alcohol cessation -He smokes half pack a day, I strongly encouraged him to cut back and quit  -I also recommend him not to drink any alcohol when he is on chemotherapy. He agrees  4. Diarrhea  -Probably related to his rectal surgery, his diarrhea has not gotten worse since he started adjuvant Xeloda -No signs of infection. -His diarrhea has much improved with Lomotil, he will continue   Plan  -He will complete cycle 3 Xeloda in 3 days and start next cycle (last cycle) on 12/16 -he will see Dr. Marcello Moores in Feb 2018 -I'll see him back in 4 months with lab  -I refilled his Lomotil   All questions were answered. The patient knows to call the clinic with any problems, questions or concerns.  I spent 15 minutes counseling the patient face to face. The total time spent in the appointment was 20 minutes and more than 50% was on counseling.     David Merle, MD 06/28/2016 5:27 PM

## 2016-07-03 ENCOUNTER — Other Ambulatory Visit: Payer: Self-pay | Admitting: Emergency Medicine

## 2016-07-07 ENCOUNTER — Telehealth: Payer: Self-pay | Admitting: Pharmacist

## 2016-07-07 ENCOUNTER — Other Ambulatory Visit: Payer: Self-pay | Admitting: *Deleted

## 2016-07-07 DIAGNOSIS — I10 Essential (primary) hypertension: Secondary | ICD-10-CM

## 2016-07-07 MED ORDER — AMLODIPINE BESYLATE 5 MG PO TABS: 5.0000 mg | ORAL_TABLET | Freq: Every day | ORAL | 1 refills | Status: DC

## 2016-07-07 NOTE — Telephone Encounter (Signed)
Received VM from [facility] stating that the patient is out of refills on Xeloda and is needing a refill Xeloda prescription to start 07/27/2016. Refills can be done verbally by calling [phone], option 1 or faxed at [phone].    Per review of MD office note from 06/28/16, pt was to start his last cycle of Xeloda on 07/09/16. No need for further Xeloda refills. Confirmed with MD 07/09/16 is the last cycle. Notified [facility] - pharmacist) regarding no additional refills needed.  Spoke with [name] (pt daughter-in-law) - she confirmed that patient does have drug to start on 07/09/16 (last cycle).  She also requested that Dr. Burr Medico refill his Amlodipine. Current pharmacy on file. Amlodipine refill request forwarded to Dr. Ernestina Penna RN. He is currently without this mediciation.  Raul Del, PharmD, BCPS, BCOP [facility] [phone]

## 2016-08-23 ENCOUNTER — Telehealth: Payer: Self-pay | Admitting: *Deleted

## 2016-08-23 NOTE — Telephone Encounter (Signed)
CALLED PATIENT TO ALTER FU ON 09-07-16 PER ALISON PERKINS, APPT. MOVED TO 09-07-16 @ 10 AM, PATIENT AGREED TO NEW APPT.

## 2016-08-31 NOTE — Progress Notes (Signed)
David Jacobs 66 y.o. Man with Rectal cancer radiation completed 01-29-16  6 month FU.  Pain:None Nausea/ Vomiting:None Diarrhea:No Having frequent bowel movements 4-5 times per day before the Lomotil, now 3-4 bowel bowel movements per day, none today Skin irritation:None Fatigue:No Weight:  Appetite is good usually eats three meals per day. Wt Readings from Last 3 Encounters:  09/07/16 215 lb 6.4 oz (97.7 kg)  06/28/16 213 lb 8 oz (96.8 kg)  06/08/16 211 lb 11.2 oz (96 kg)  BP (!) 162/57   Pulse 79   Temp 98.2 F (36.8 C) (Oral)   Resp 18   Ht 5\' 11"  (1.803 m)   Wt 215 lb 6.4 oz (97.7 kg)   SpO2 (!) 10%   BMI 30.04 kg/m

## 2016-09-07 ENCOUNTER — Encounter: Payer: Self-pay | Admitting: Radiation Oncology

## 2016-09-07 ENCOUNTER — Ambulatory Visit
Admission: RE | Admit: 2016-09-07 | Discharge: 2016-09-07 | Disposition: A | Discharge status: Home / Self Care | Payer: No Typology Code available for payment source | Source: Ambulatory Visit | Attending: Radiation Oncology | Admitting: Radiation Oncology

## 2016-09-07 VITALS — BP 162/57 | HR 79 | Temp 98.2°F | Resp 18 | Ht 71.0 in | Wt 215.4 lb

## 2016-09-07 DIAGNOSIS — F17210 Nicotine dependence, cigarettes, uncomplicated: Secondary | ICD-10-CM | POA: Diagnosis not present

## 2016-09-07 DIAGNOSIS — Z79899 Other long term (current) drug therapy: Secondary | ICD-10-CM | POA: Insufficient documentation

## 2016-09-07 DIAGNOSIS — C20 Malignant neoplasm of rectum: Secondary | ICD-10-CM | POA: Diagnosis not present

## 2016-09-08 NOTE — Progress Notes (Signed)
Radiation Oncology         [phone] ________________________________  Name: David Jacobs MRN: [id number]  Date: 09/07/2016  DOB: 08/03/1950  Follow-Up Visit Note  CC: No PCP Per Patient  David Silence, MD  Diagnosis:  Stage IIA, T3N0M0 adenocarcinoma of the proximal rectum.  Interval Since Last Radiation: 1 month  12/22/15-01/29/16: 50.4 Gy in total to the rectum in 30 fractions including 3 fractions of boost to the rectum.  Narrative:  In summary this is a pleasant 66 y.o.  gentleman who received neoadjuvant chemotherapy and radiation planning to undergo interval surgical resection followed by additional adjuvant chemotherapy. He did well and tolerating his radiation treatment and comes today for follow up evaluation. He underwent robotic APR with Dr. Marcello Jacobs on 03/25/16 which revealed adenocarcinoma of the rectum with negative margins and lymph nodes.  On review of systems, the patient reports that he is doing well overall. He denies any chest pain, shortness of breath, cough, fevers, chills, night sweats, unintended weight changes. He denies any bowel or bladder disturbances, and denies abdominal pain, nausea or vomiting. He denies any new musculoskeletal or joint aches or pains, new skin lesions or concerns. A complete review of systems is obtained and is otherwise negative.   Past Medical History:  Past Medical History:  Diagnosis Date  . Hypertension     Past Surgical History: Past Surgical History:  Procedure Laterality Date  . EUS N/A 12/09/2015   Procedure: LOWER ENDOSCOPIC ULTRASOUND (EUS);  Surgeon: David Silence, MD;  Location: [facility];  Service: Endoscopy;  Laterality: N/A;  . XI ROBOTIC ASSISTED LOWER ANTERIOR RESECTION N/A 03/25/2016   Procedure: XI ROBOTIC ASSISTED LOWER ANTERIOR RESECTION;  Surgeon: David Ruff, MD;  Location: [facility];  Service: General;  Laterality: N/A;    Social History:  Social History   Social History  . Marital status: Married   Spouse name: N/A  . Number of children: 2  . Years of education: N/A   Occupational History  . Not on file.   Social History Main Topics  . Smoking status: Current Every Day Smoker    Packs/day: 0.50    Years: 35.00  . Smokeless tobacco: Never Used  . Alcohol use No  . Drug use: No  . Sexual activity: Not on file   Other Topics Concern  . Not on file   Social History Narrative   Married, lives w/his wife and a son--minimal English   Works full time at [employer]   Current everyday smoker, with no plans to quit   Primary contact is daughter-in-law, David Jacobs (can interpret for him)    Family History: Family History  Problem Relation Age of Onset  . Cancer Mother 81    throid cancer   . Hyperlipidemia Mother   . Diabetes Father      ALLERGIES:  has No Known Allergies.  Meds: Current Outpatient Prescriptions  Medication Sig Dispense Refill  . amLODipine (NORVASC) 5 MG tablet Take 1 tablet (5 mg total) by mouth daily. 30 tablet 1  . diphenoxylate-atropine (LOMOTIL) 2.5-0.025 MG tablet Take 1 tablet by mouth 4 (four) times daily as needed for diarrhea or loose stools. 30 tablet 1  . losartan-hydrochlorothiazide (HYZAAR) 100-25 MG tablet Take 1 tablet by mouth daily.     No current facility-administered medications for this encounter.     Physical Findings:  height is 5\' 11"  (1.803 m) and weight is 215 lb 6.4 oz (97.7 kg). His oral temperature is 98.2 F (36.8  C). His blood pressure is 162/57 (abnormal) and his pulse is 79. His respiration is 18 and oxygen saturation is 10% (abnormal).  In general this is a well appearing [place] european male in no acute distress. He's alert and oriented x4 and appropriate throughout the examination. Cardiopulmonary assessment is negative for acute distress and he exhibits normal effort.  He declines additional examination.   Lab Findings: Lab Results  Component Value Date   WBC 7.0 06/28/2016   HGB 13.3 06/28/2016     HCT 39.6 06/28/2016   MCV 95.6 06/28/2016   PLT 271 06/28/2016     Radiographic Findings: No results found.  Impression/Plan: 1. Stage IIA, T3N0M0 adenocarcinoma of the proximal rectum. The patient is doing very well overall since completing his radiotherapy and since surgery. He will continue to follow up with colorectal surgery, and medical oncology. I will plan to see him as needed moving forward.      David Jacobs, PAC

## 2016-09-22 ENCOUNTER — Other Ambulatory Visit: Payer: Self-pay | Admitting: Hematology

## 2016-09-22 DIAGNOSIS — C20 Malignant neoplasm of rectum: Secondary | ICD-10-CM

## 2016-10-08 ENCOUNTER — Ambulatory Visit (INDEPENDENT_AMBULATORY_CARE_PROVIDER_SITE_OTHER): Payer: No Typology Code available for payment source | Admitting: Family Medicine

## 2016-10-08 DIAGNOSIS — I10 Essential (primary) hypertension: Secondary | ICD-10-CM | POA: Diagnosis not present

## 2016-10-08 DIAGNOSIS — C20 Malignant neoplasm of rectum: Secondary | ICD-10-CM

## 2016-10-08 MED ORDER — DIPHENOXYLATE-ATROPINE 2.5-0.025 MG PO TABS: ORAL_TABLET | ORAL | 0 refills | Status: DC

## 2016-10-08 MED ORDER — AMLODIPINE BESYLATE 5 MG PO TABS: 5.0000 mg | ORAL_TABLET | Freq: Every day | ORAL | 1 refills | Status: DC

## 2016-10-08 MED ORDER — LOSARTAN POTASSIUM-HCTZ 100-25 MG PO TABS: 1.0000 | ORAL_TABLET | Freq: Every day | ORAL | 2 refills | Status: DC

## 2016-10-08 MED ORDER — AMLODIPINE BESYLATE 5 MG PO TABS: 5.0000 mg | ORAL_TABLET | Freq: Every day | ORAL | 2 refills | Status: DC

## 2016-10-08 MED ORDER — LOSARTAN POTASSIUM-HCTZ 100-25 MG PO TABS: 1.0000 | ORAL_TABLET | Freq: Every day | ORAL | 1 refills | Status: DC

## 2016-10-08 NOTE — Progress Notes (Signed)
Patient ID: David Jacobs, male    DOB: 1950/12/10, 66 y.o.   MRN: [id number]  PCP: No PCP Per Patient  Chief Complaint  Patient presents with  . Medication Refill    Norvasc, lomotil, losartan    Subjective:  HPI David Jacobs, 66 year old male presents for evaluation of hypertension and antidiarrheal refill request.   Hypertension  Regularly good readings at medical appointments. He doesn't routinely check blood pressure at home. He reports being out of medication for at least 2 days and attributes this to elevated readings on today. He denies dizziness, shortness of breath, chest pain or headaches.  Rectal Cancer -at [facility] Dr. Dahlia Byes and Dr. Lisbeth Renshaw are his oncologist Requests a refill of lomotil. He occasionally experiences diarrhea which interferes with his work day. He is not taking medication chronically. Previously prescribed by oncologist.   Social History   Social History  . Marital status: Married    Spouse name: N/A  . Number of children: 2  . Years of education: N/A   Occupational History  . Not on file.   Social History Main Topics  . Smoking status: Current Every Day Smoker    Packs/day: 0.50    Years: 35.00  . Smokeless tobacco: Never Used  . Alcohol use No  . Drug use: No  . Sexual activity: Not on file   Other Topics Concern  . Not on file   Social History Narrative   Married, lives w/his wife and a son--minimal English   Works full time at [employer]   Current everyday smoker, with no plans to quit   Primary contact is daughter-in-law, David Jacobs (can interpret for him)    Family History  Problem Relation Age of Onset  . Cancer Mother 12    throid cancer   . Hyperlipidemia Mother   . Diabetes Father    Review of Systems  See HPI  Patient Active Problem List   Diagnosis Date Noted  . Rectal cancer ([place]) 12/07/2015  . HTN (hypertension) 12/07/2015  . Essential hypertension 11/15/2015    No Known  Allergies  Prior to Admission medications   Medication Sig Start Date End Date Taking? Authorizing Provider  amLODipine (NORVASC) 5 MG tablet Take 1 tablet (5 mg total) by mouth daily. 07/07/16  Yes Truitt Merle, MD  diphenoxylate-atropine (LOMOTIL) 2.5-0.025 MG tablet TAKE 1 TABLET BY MOUTH FOUR TIMES DAILY AS NEEDED FOR DIARRHEA OR LOOSE STOOLS 09/27/16  Yes Truitt Merle, MD  losartan-hydrochlorothiazide (HYZAAR) 100-25 MG tablet Take 1 tablet by mouth daily.   Yes Historical Provider, MD    Past Medical, Surgical Family and Social History reviewed and updated.    Objective:   Today's Vitals   10/08/16 1003 10/08/16 1030  BP: (!) 152/64 (!) 160/66  Pulse: 74   Resp: 17   Temp: 97.5 F (36.4 C)   TempSrc: Oral   SpO2: 99%   Weight: 216 lb (98 kg)   Height: 5\' 11"  (1.803 m)     Wt Readings from Last 3 Encounters:  10/08/16 216 lb (98 kg)  09/07/16 215 lb 6.4 oz (97.7 kg)  06/28/16 213 lb 8 oz (96.8 kg)    Physical Exam  Constitutional: He is oriented to person, place, and time. He appears well-developed and well-nourished.  HENT:  Head: Normocephalic and atraumatic.  Eyes: Conjunctivae and EOM are normal. Pupils are equal, round, and reactive to light.  Neck: Normal range of motion. Neck supple.  Cardiovascular: Normal rate,  regular rhythm, normal heart sounds and intact distal pulses.   Pulmonary/Chest: Effort normal and breath sounds normal.  Lymphadenopathy:    He has no cervical adenopathy.  Neurological: He is alert and oriented to person, place, and time.  Skin: Skin is warm and dry.  Psychiatric: He has a normal mood and affect. His behavior is normal. Thought content normal.     Assessment & Plan:  1. Rectal cancer ([facility]) - diphenoxylate-atropine (LOMOTIL) 2.5-0.025 MG tablet; TAKE 1 TABLET BY MOUTH FOUR TIMES DAILY AS NEEDED FOR DIARRHEA OR LOOSE STOOLS  Dispense: 30 tablet; Refill: 0  2. Essential hypertension - Amlodipine  (NORVASC) 5 MG tablet; Take 1 tablet by  mouth daily.  -Losartan-hydrochlorothiazide  (Hyzaar) 100-25 mg tablets; Take 1 tablet by mouth daily.   Return for follow-up in 9 months for complete annual exam with Dr. Rosezella Rumpf.  Carroll Sage. Kenton Kingfisher, MSN, FNP-C [facility] [place]

## 2016-10-08 NOTE — Patient Instructions (Addendum)
I have refilled your medications. Please return in 9 months for annual physical exam.   IF you received an x-ray today, you will receive an invoice from [facility]. Please contact [facility] at [phone] with questions or concerns regarding your invoice.   IF you received labwork today, you will receive an invoice from [facility]. Please contact [facility] at [phone] with questions or concerns regarding your invoice.   Our billing staff will not be able to assist you with questions regarding bills from these companies.  You will be contacted with the lab results as soon as they are available. The fastest way to get your results is to activate your My Chart account. Instructions are located on the last page of this paperwork. If you have not heard from [place] regarding the results in 2 weeks, please contact this office.      Exercising to Stay Healthy Exercising regularly is important. It has many health benefits, such as:  Improving your overall fitness, flexibility, and endurance.  Increasing your bone density.  Helping with weight control.  Decreasing your body fat.  Increasing your muscle strength.  Reducing stress and tension.  Improving your overall health. In order to become healthy and stay healthy, it is recommended that you do moderate-intensity and vigorous-intensity exercise. You can tell that you are exercising at a moderate intensity if you have a higher heart rate and faster breathing, but you are still able to hold a conversation. You can tell that you are exercising at a vigorous intensity if you are breathing much harder and faster and cannot hold a conversation while exercising. How often should I exercise? Choose an activity that you enjoy and set realistic goals. Your health care provider can help you to make an activity plan that works for you. Exercise regularly as directed by your health care provider. This may include:  Doing resistance  training twice each week, such as:  Push-ups.  Sit-ups.  Lifting weights.  Using resistance bands.  Doing a given intensity of exercise for a given amount of time. Choose from these options:  150 minutes of moderate-intensity exercise every week.  75 minutes of vigorous-intensity exercise every week.  A mix of moderate-intensity and vigorous-intensity exercise every week. Children, pregnant women, people who are out of shape, people who are overweight, and older adults may need to consult a health care provider for individual recommendations. If you have any sort of medical condition, be sure to consult your health care provider before starting a new exercise program. What are some exercise ideas? Some moderate-intensity exercise ideas include:  Walking at a rate of 1 mile in 15 minutes.  Biking.  Hiking.  Golfing.  Dancing. Some vigorous-intensity exercise ideas include:  Walking at a rate of at least 4.5 miles per hour.  Jogging or running at a rate of 5 miles per hour.  Biking at a rate of at least 10 miles per hour.  Lap swimming.  Roller-skating or in-line skating.  Cross-country skiing.  Vigorous competitive sports, such as football, basketball, and soccer.  Jumping rope.  Aerobic dancing. What are some everyday activities that can help me to get exercise?  Yard work, such as:  Psychologist, educational.  Raking and bagging leaves.  Washing and waxing your car.  Pushing a stroller.  Shoveling snow.  Gardening.  Washing windows or floors. How can I be more active in my day-to-day activities?  Use the stairs instead of the elevator.  Take a walk during your  lunch break.  If you drive, park your car farther away from work or school.  If you take public transportation, get off one stop early and walk the rest of the way.  Make all of your phone calls while standing up and walking around.  Get up, stretch, and walk around every 30 minutes  throughout the day. What guidelines should I follow while exercising?  Do not exercise so much that you hurt yourself, feel dizzy, or get very short of breath.  Consult your health care provider before starting a new exercise program.  Wear comfortable clothes and shoes with good support.  Drink plenty of water while you exercise to prevent dehydration or heat stroke. Body water is lost during exercise and must be replaced.  Work out until you breathe faster and your heart beats faster. This information is not intended to replace advice given to you by your health care provider. Make sure you discuss any questions you have with your health care provider. Document Released: 08/13/2010 Document Revised: 12/17/2015 Document Reviewed: 12/12/2013 Elsevier Interactive Patient Education  2017 [employer].

## 2016-10-20 NOTE — Progress Notes (Signed)
[place]  [phone] [phone]) [phone]  Clinic Follow Up Note   Patient Care Team: [facility], MD as PCP - General (Internal Medicine) David Ruff, MD as Consulting Physician (General Surgery) 10/27/2016   CHIEF COMPLAINTS:  Follow Up rectal cancer  Oncology History   Rectal cancer [facility])   Staging form: Colon and Rectum, AJCC 7th Edition   - Clinical stage from 12/09/2015: Stage IIA (T3, N0, M0) - Signed by David Merle, MD on 12/22/2015   - Pathologic stage from 03/25/2016: Stage I (yT2, N0, cM0) - Signed by David Merle, MD on 04/22/2016       Rectal cancer ([place])   11/23/2015 Initial Diagnosis    Rectal cancer ([place])      11/23/2015 Procedure    Colonoscopy showed a large fungating ulcerated partially obstructing mass in the proximal rectum. This was biopsied.      11/23/2015 Initial Biopsy    Rectal mass biopsy showed adenocarcinoma      11/25/2015 Imaging    CT chest, abdomen and pelvis with contrast showed focal wall thickening involving the low rectum, no findings suspicious for metastatic disease.      12/09/2015 Procedure    EUS showed a T3 N0 lesion in proximal rectum.      12/22/2015 - 02/01/2016 Radiation Therapy    New adjuvant irradiation to the rectal cancer      12/22/2015 - 02/01/2016 Chemotherapy    Neoadjuvant Xeloda 2000mg  in AM, and 1500mg  in PM with concurrent radiation       03/25/2016 Surgery    Robotic-assisted anterior resection of proximal rectal cancer      03/25/2016 Pathology Results    Rectal sigmoid segmental resection showed adenocarcinoma with muscularis propria and aubmucosa invasion, margins were negative, extensive fibrosis, 12 lymph nodes were negative.      05/04/2016 - 07/23/2016 Chemotherapy    Adjuvant Xeloda 2000mg  bid, 2 weeks on and one week off         HISTORY OF PRESENTING ILLNESS (12/07/2015):  David Jacobs 66 y.o. male is here because of His recently diagnosed rectal cancer. He is  accompanied by an interpreter and daughter-in-law David Jacobs,  to the clinic today.  He has been having rectal bleeding for 3 months, stable overall, small amount, usually once a day, no pain, he otherwise feels well, no fatigue or weight change lately. He was referred by his primary care physician to gastroenterologist Dr. Paulita Jacobs, and underwent colonoscopy on 11/23/2015. It showed a fungating ulcerated partially obstructing mass in the proximal rectum, They showed adenocarcinoma. CT scan revealed no evidence of distant metastasis.He was referred to [place] for further management.  He works for the [employer]still works full-time. He livers with his wife, one of his son and his family. He never had a colonoscopy before he reason one. No family history of colon cancer.    CURRENT THERAPY: Surveillance    INTERIM HISTORY: David Jacobs returns for follow-up. He is doing good and says he feels much better since completing chemo. His BM are  2-4 times a day depending on what he eats. When he doesn't take Lomotil he says it is much looser, which he takes twice a day. He has returned to work and says everything is normal. He denies numbness and tingling.     MEDICAL HISTORY:  Past Medical History:  Diagnosis Date  . Hypertension     SURGICAL HISTORY: Past Surgical History:  Procedure Laterality Date  . EUS N/A 12/09/2015  Procedure: LOWER ENDOSCOPIC ULTRASOUND (EUS);  Surgeon: David Silence, MD;  Location: [facility];  Service: Endoscopy;  Laterality: N/A;  . XI ROBOTIC ASSISTED LOWER ANTERIOR RESECTION N/A 03/25/2016   Procedure: XI ROBOTIC ASSISTED LOWER ANTERIOR RESECTION;  Surgeon: David Ruff, MD;  Location: [facility];  Service: General;  Laterality: N/A;    SOCIAL HISTORY: Social History   Social History  . Marital status: Married    Spouse name: N/A  . Number of children: 2  . Years of education: N/A   Occupational History  . Not on file.   Social History Main Topics  . Smoking  status: Current Every Day Smoker    Packs/day: 0.50    Years: 35.00  . Smokeless tobacco: Never Used  . Alcohol use No  . Drug use: No  . Sexual activity: Not on file   Other Topics Concern  . Not on file   Social History Narrative   Married, lives w/his wife and a son--minimal English   Works full time at [employer]   Current everyday smoker, with no plans to quit   Primary contact is daughter-in-law, David Jacobs (can interpret for him)    FAMILY HISTORY: Family History  Problem Relation Age of Onset  . Cancer Mother 63    throid cancer   . Hyperlipidemia Mother   . Diabetes Father     ALLERGIES:  has No Known Allergies.  MEDICATIONS:  Current Outpatient Prescriptions  Medication Sig Dispense Refill  . amLODipine (NORVASC) 5 MG tablet Take 1 tablet (5 mg total) by mouth daily. 90 tablet 2  . diphenoxylate-atropine (LOMOTIL) 2.5-0.025 MG tablet TAKE 1 TABLET BY MOUTH FOUR TIMES DAILY AS NEEDED FOR DIARRHEA OR LOOSE STOOLS 60 tablet 2  . losartan-hydrochlorothiazide (HYZAAR) 100-25 MG tablet Take 1 tablet by mouth daily. 90 tablet 2   No current facility-administered medications for this visit.     REVIEW OF SYSTEMS:   Constitutional: Denies fevers, chills or abnormal night sweats Eyes: Denies blurriness of vision, double vision or watery eyes Ears, nose, mouth, throat, and face: Denies mucositis or sore throat Respiratory: Denies cough, dyspnea or wheezes Cardiovascular: Denies palpitation, chest discomfort or lower extremity swelling Gastrointestinal:  Denies nausea, heartburn or change in bowel habits Skin: Denies abnormal skin rashes Lymphatics: Denies new lymphadenopathy or easy bruising Neurological:Denies numbness, tingling or new weaknesses Behavioral/Psych: Mood is stable, no new changes  All other systems were reviewed with the patient and are negative.  PHYSICAL EXAMINATION: ECOG PERFORMANCE STATUS: 0  Vitals:   10/27/16 1404  BP: (!)  149/65  Pulse: 72  Resp: 18  Temp: 98.1 F (36.7 C)   Filed Weights   10/27/16 1404  Weight: 213 lb 14.4 oz (97 kg)    GENERAL:alert, no distress and comfortable SKIN: skin color, texture, turgor are normal, no rashes or significant lesions EYES: normal, conjunctiva are pink and non-injected, sclera clear OROPHARYNX:no exudate, no erythema and lips, buccal mucosa, and tongue normal  NECK: supple, thyroid normal size, non-tender, without nodularity LYMPH:  no palpable lymphadenopathy in the cervical, axillary or inguinal LUNGS: clear to auscultation and percussion with normal breathing effort HEART: regular rate & rhythm and no murmurs and no lower extremity edema ABDOMEN:abdomen soft, non-tender and normal bowel sounds, no organomegaly, multiple small surgical scars have healed well. No inguinal lymphadenopathy. Patient declined rectal exam. Musculoskeletal:no cyanosis of digits and no clubbing  PSYCH: alert & oriented x 3 with fluent speech NEURO: no focal motor/sensory deficits GROIN: normal. No  lymph nodes felt  LABORATORY DATA:  I have reviewed the data as listed CBC Latest Ref Rng & Units 10/27/2016 06/28/2016 06/08/2016  WBC 4.0 - 10.3 10e3/uL 6.1 7.0 5.6  Hemoglobin 13.0 - 17.1 g/dL 14.7 13.3 13.5  Hematocrit 38.4 - 49.9 % 42.8 39.6 38.5  Platelets 140 - 400 10e3/uL 262 271 278    CMP Latest Ref Rng & Units 10/27/2016 06/28/2016 06/08/2016  Glucose 70 - 140 mg/dl 126 113 138  BUN 7.0 - 26.0 mg/dL 20.3 17.4 20.6  Creatinine 0.7 - 1.3 mg/dL 1.0 1.0 1.1  Sodium 136 - 145 mEq/L 139 139 138  Potassium 3.5 - 5.1 mEq/L 3.8 4.3 3.7  Chloride 101 - 111 mmol/L - - -  CO2 22 - 29 mEq/L 26 26 25   Calcium 8.4 - 10.4 mg/dL 9.6 9.8 9.9  Total Protein 6.4 - 8.3 g/dL 7.0 7.1 6.9  Total Bilirubin 0.20 - 1.20 mg/dL 0.42 0.77 0.57  Alkaline Phos 40 - 150 U/L 54 62 61  AST 5 - 34 U/L 14 15 13   ALT 0 - 55 U/L 17 14 12    Diagnosis 03/25/2016 1. Colon, segmental resection for tumor,  rectosigmoid - ADENOCARCINOMA WITHIN MUSCULARIS PROPRIA AND FOCALLY SUBMUCOSA. - MARGINS NOT INVOLVED. - EXTENSIVE FIBROSIS WITH DYSTROPHIC CALCIFICATIONS. - TWELVE BENIGN LYMPH NODES (0/12). - SEE ONCOLOGY TABLE BELOW. 2. Colon, resection margin (donut), final distal - BENIGN COLON. - NO EVIDENCE OF MALIGNANCY.  Microscopic Comment 1. COLON AND RECTUM (INCLUDING TRANS-ANAL RESECTION): Specimen: Rectosigmoid colon. Procedure: Resection. Tumor site: Proximal rectum, right lateral wall. Specimen integrity: Intact. Macroscopic intactness of mesorectum: Complete. Macroscopic tumor perforation: No. Invasive tumor: Maximum size: 0.4 cm, see comment. Histologic type(s): Colorectal adenocarcinoma. Histologic grade and differentiation: G3: poorly differentiated/high grade. Type of polyp in which invasive carcinoma arose: No residual polyp. Microscopic extension of invasive tumor: Microscopic foci within muscularis propria and submucosa. Lymph-Vascular invasion: Not identified. Peri-neural invasion: Not identified. Tumor deposit(s) (discontinuous extramural extension): No. Resection margins Proximal margin: Free of tumor. Distal margin: Free of tumor. Circumferential (radial) (posterior ascending, posterior descending; latera and posterior mid-rectum; and entire lower 1/3 rectum): Free of tumor. Mesenteric margin (sigmoid and transverse): N/A. Distance closest margin (if all above margins negative): 2.8 cm distal margin. Treatment effect (neo-adjuvant therapy): Yes. Additional polyp(s): No. Non-neoplastic findings: N/A. Lymph nodes: number examined 12; number positive: 0. Pathologic Staging: ypT2, ypN0, ypMX. Ancillary studies: Can be performed if requested. Comments: There is extensive fibrosis with dystrophic calcification which focally extends into the perirectal adipose tissue. There are microscopic foci of residual adenocarcinoma situated mainly in the muscularis propria and  focally submucosa. No tumor is identified in the perirectal adipose tissue. Several of the lymph nodes have areas of fibrosis within the nodal parenchyma. (JDP:ds 03/29/16)  PATHOLOGY REPORT: Rectum biopsy 11/23/2015 -Invasive adenocarcinoma  Microscopic comment Interval Departmental review obtained (Dr. Lyndon Code) with agreement.  RADIOGRAPHIC STUDIES: I have personally reviewed the radiological images as listed and agreed with the findings in the report. No results found.   COLONOSCOPY Dr. Paulita Jacobs 11/23/2015  -A front-like villous fungating polypoid sessile and ulcerated partially obstructing large mass was found in the proximal rectum. The mass was partially circumferential (involving one half of the lumen circumference). Bruising was present. This was biopsied. -Non-thrombosed external hemorrhoids -A single nonbleeding clonic angiodysplastic lesion in the hepatic flexure and descending colon  EUS 12/09/2015 - Non-thrombosed external hemorrhoids found on perianal exam. - Malignant partially obstructing tumor in the proximal rectum. - Endosonographic images of the  perirectal space were unremarkable. - No lymph nodes were seen in the perirectal region and in a peritumoral location during endosonographic examination. - Rectal mass was visualized endosonographically. A tissue diagnosis was obtained prior to this exam. This is of adenocarcinoma. This was staged uT3 uN0.   ASSESSMENT & PLAN: 66 y.o. presented with intermittent rectal bleeding for 3 months.   1. Proximal rectal adenocarcinoma, uT3N0M0, stage IIA, ypT2N0M0 --I previously discussed his surgical pathology results with patient and his daughter in details -He has had a partial response to neoadjuvant chemoradiation, still has residual T2 disease, nodes were negative. -He has completed adjuvant Xeloda. - Labs reviewed, they are within normal limits. He is clinically doing very well, exam was unremarkable, no cranial concern for  recurrence. - CT scan in 2-3 months on next visit - colonoscopy in September  -Who will continue cancer surveillance,  which includes routine follow-up with lab and exam every 3-4 months for the first 3 years, then every 6-12 months, for total 5 years, and a CT scan every year and colonoscopy in one year after srugery.  2. HTN -Continue medication and follow up with primary care physician -His blood pressure has been high lately,  I previously  encouraged him to monitor at home -he previously restarted Norvasc   3. Smoking and alcohol cessation -He smokes half pack a day, I previously strongly encouraged him to cut back and quit  -I also previously recommend him not to drink any alcohol when he is on chemotherapy. He agrees - Patient asks if he can drink a beer. I have advised him it would be fine if he doesn't drink a lot. He says he won't drink  4. Diarrhea  -Probably related to his rectal surgery, his diarrhea has not gotten worse since he started adjuvant Xeloda -No signs of infection. -His diarrhea has much improved with Lomotil, he will continue  - Patient says taking 1 Lomotil a day does not help so he takes 2 daily. He requests more than 1 refill of Lomotil - I will refill Lomotil 60 for 1 month with 2 refills  Plan  - I will refill Lomotil 60 for 1 month with 2 refills - f/u in 3 months - Lab and CT CAP with contrast one week before f/u    All questions were answered. The patient knows to call the clinic with any problems, questions or concerns.  I spent 20 minutes counseling the patient face to face. The total time spent in the appointment was 25 minutes and more than 50% was on counseling.   This document serves as a record of services personally performed by David Merle, MD. It was created on her behalf by Brandt Loosen, a trained medical scribe. The creation of this record is based on the scribe's personal observations and the provider's statements to them. This document has  been checked and approved by the attending provider.   David Merle, MD 10/27/2016 5:15 PM

## 2016-10-27 ENCOUNTER — Encounter: Payer: Self-pay | Admitting: Hematology

## 2016-10-27 ENCOUNTER — Telehealth: Payer: Self-pay | Admitting: Hematology

## 2016-10-27 ENCOUNTER — Ambulatory Visit (HOSPITAL_BASED_OUTPATIENT_CLINIC_OR_DEPARTMENT_OTHER): Payer: No Typology Code available for payment source | Admitting: Hematology

## 2016-10-27 ENCOUNTER — Other Ambulatory Visit (HOSPITAL_BASED_OUTPATIENT_CLINIC_OR_DEPARTMENT_OTHER): Payer: No Typology Code available for payment source

## 2016-10-27 VITALS — BP 149/65 | HR 72 | Temp 98.1°F | Resp 18 | Ht 71.0 in | Wt 213.9 lb

## 2016-10-27 DIAGNOSIS — C20 Malignant neoplasm of rectum: Secondary | ICD-10-CM

## 2016-10-27 DIAGNOSIS — I10 Essential (primary) hypertension: Secondary | ICD-10-CM

## 2016-10-27 DIAGNOSIS — Z720 Tobacco use: Secondary | ICD-10-CM | POA: Diagnosis not present

## 2016-10-27 LAB — CBC WITH DIFFERENTIAL/PLATELET
BASO%: 0.7 % (ref 0.0–2.0)
BASOS ABS: 0 10*3/uL (ref 0.0–0.1)
EOS ABS: 0.2 10*3/uL (ref 0.0–0.5)
EOS%: 3.4 % (ref 0.0–7.0)
HCT: 42.8 % (ref 38.4–49.9)
HGB: 14.7 g/dL (ref 13.0–17.1)
LYMPH%: 35.2 % (ref 14.0–49.0)
MCH: 32.2 pg (ref 27.2–33.4)
MCHC: 34.3 g/dL (ref 32.0–36.0)
MCV: 94 fL (ref 79.3–98.0)
MONO#: 0.5 10*3/uL (ref 0.1–0.9)
MONO%: 7.5 % (ref 0.0–14.0)
NEUT#: 3.2 10*3/uL (ref 1.5–6.5)
NEUT%: 53.2 % (ref 39.0–75.0)
Platelets: 262 10*3/uL (ref 140–400)
RBC: 4.55 10*6/uL (ref 4.20–5.82)
RDW: 13.1 % (ref 11.0–14.6)
WBC: 6.1 10*3/uL (ref 4.0–10.3)
lymph#: 2.1 10*3/uL (ref 0.9–3.3)

## 2016-10-27 LAB — COMPREHENSIVE METABOLIC PANEL
ALT: 17 U/L (ref 0–55)
AST: 14 U/L (ref 5–34)
Albumin: 4.2 g/dL (ref 3.5–5.0)
Alkaline Phosphatase: 54 U/L (ref 40–150)
Anion Gap: 8 mEq/L (ref 3–11)
BUN: 20.3 mg/dL (ref 7.0–26.0)
CHLORIDE: 105 meq/L (ref 98–109)
CO2: 26 mEq/L (ref 22–29)
Calcium: 9.6 mg/dL (ref 8.4–10.4)
Creatinine: 1 mg/dL (ref 0.7–1.3)
EGFR: 77 mL/min/{1.73_m2} — AB (ref >90)
GLUCOSE: 126 mg/dL (ref 70–140)
POTASSIUM: 3.8 meq/L (ref 3.5–5.1)
SODIUM: 139 meq/L (ref 136–145)
Total Bilirubin: 0.42 mg/dL (ref 0.20–1.20)
Total Protein: 7 g/dL (ref 6.4–8.3)

## 2016-10-27 LAB — IRON AND TIBC
%SAT: 49 % (ref 20–55)
Iron: 119 ug/dL (ref 42–163)
TIBC: 243 ug/dL (ref 202–409)
UIBC: 124 ug/dL (ref 117–376)

## 2016-10-27 MED ORDER — DIPHENOXYLATE-ATROPINE 2.5-0.025 MG PO TABS
ORAL_TABLET | ORAL | 2 refills | Status: DC
Start: 2016-10-27 — End: 2017-03-31

## 2016-10-27 NOTE — Telephone Encounter (Signed)
Appointments will be scheduled following clarification of expected appointment dates from [place].

## 2016-10-28 LAB — CEA (IN HOUSE-CHCC): CEA (CHCC-In House): 2.57 ng/mL (ref 0.00–5.00)

## 2016-10-28 LAB — FERRITIN: Ferritin: 154 ng/ml (ref 22–316)

## 2016-11-04 ENCOUNTER — Telehealth: Payer: Self-pay | Admitting: Hematology

## 2016-11-04 NOTE — Telephone Encounter (Signed)
Lvm advising appts 5/24 @ 9.15 + 5/31 @ 1.15pm. Advised in vm radiology will call with CT appt and instructions.

## 2016-12-15 ENCOUNTER — Ambulatory Visit (HOSPITAL_COMMUNITY)
Admission: RE | Admit: 2016-12-15 | Discharge: 2016-12-15 | Disposition: A | Discharge status: Home / Self Care | Payer: No Typology Code available for payment source | Source: Ambulatory Visit | Attending: Hematology | Admitting: Hematology

## 2016-12-15 ENCOUNTER — Other Ambulatory Visit (HOSPITAL_BASED_OUTPATIENT_CLINIC_OR_DEPARTMENT_OTHER): Payer: No Typology Code available for payment source

## 2016-12-15 DIAGNOSIS — Z9049 Acquired absence of other specified parts of digestive tract: Secondary | ICD-10-CM | POA: Insufficient documentation

## 2016-12-15 DIAGNOSIS — I2510 Atherosclerotic heart disease of native coronary artery without angina pectoris: Secondary | ICD-10-CM | POA: Diagnosis not present

## 2016-12-15 DIAGNOSIS — C20 Malignant neoplasm of rectum: Secondary | ICD-10-CM | POA: Diagnosis not present

## 2016-12-15 LAB — COMPREHENSIVE METABOLIC PANEL
ALT: 15 U/L (ref 0–55)
ANION GAP: 9 meq/L (ref 3–11)
AST: 11 U/L (ref 5–34)
Albumin: 4.1 g/dL (ref 3.5–5.0)
Alkaline Phosphatase: 59 U/L (ref 40–150)
BUN: 20.2 mg/dL (ref 7.0–26.0)
CHLORIDE: 106 meq/L (ref 98–109)
CO2: 24 meq/L (ref 22–29)
CREATININE: 1.1 mg/dL (ref 0.7–1.3)
Calcium: 9.6 mg/dL (ref 8.4–10.4)
EGFR: 73 mL/min/{1.73_m2} — ABNORMAL LOW (ref >90)
Glucose: 120 mg/dl (ref 70–140)
POTASSIUM: 4.1 meq/L (ref 3.5–5.1)
Sodium: 140 mEq/L (ref 136–145)
Total Bilirubin: 0.58 mg/dL (ref 0.20–1.20)
Total Protein: 7 g/dL (ref 6.4–8.3)

## 2016-12-15 LAB — CBC WITH DIFFERENTIAL/PLATELET
BASO%: 0.5 % (ref 0.0–2.0)
Basophils Absolute: 0 10*3/uL (ref 0.0–0.1)
EOS%: 2.6 % (ref 0.0–7.0)
Eosinophils Absolute: 0.2 10*3/uL (ref 0.0–0.5)
HCT: 43.2 % (ref 38.4–49.9)
HGB: 15 g/dL (ref 13.0–17.1)
LYMPH%: 30.7 % (ref 14.0–49.0)
MCH: 31.2 pg (ref 27.2–33.4)
MCHC: 34.7 g/dL (ref 32.0–36.0)
MCV: 89.8 fL (ref 79.3–98.0)
MONO#: 0.5 10*3/uL (ref 0.1–0.9)
MONO%: 6.8 % (ref 0.0–14.0)
NEUT#: 3.9 10*3/uL (ref 1.5–6.5)
NEUT%: 59.4 % (ref 39.0–75.0)
PLATELETS: 275 10*3/uL (ref 140–400)
RBC: 4.81 10*6/uL (ref 4.20–5.82)
RDW: 13.3 % (ref 11.0–14.6)
WBC: 6.6 10*3/uL (ref 4.0–10.3)
lymph#: 2 10*3/uL (ref 0.9–3.3)

## 2016-12-15 MED ORDER — IOPAMIDOL (ISOVUE-300) INJECTION 61%
INTRAVENOUS | Status: AC
  Filled 2016-12-15: qty 100

## 2016-12-15 MED ORDER — IOPAMIDOL (ISOVUE-300) INJECTION 61%
100.0000 mL | Freq: Once | INTRAVENOUS | Status: AC | PRN
  Administered 2016-12-15: 100 mL via INTRAVENOUS

## 2016-12-20 NOTE — Progress Notes (Signed)
[place]  [phone] [phone]  Clinic Follow Up Note   Patient Care Team: Horald Pollen, MD as PCP - General (Internal Medicine) Leighton Ruff, MD as Consulting Physician (General Surgery) 12/22/2016   CHIEF COMPLAINTS:  Follow Up rectal cancer  Oncology History   Rectal cancer [facility])   Staging form: Colon and Rectum, AJCC 7th Edition   - Clinical stage from 12/09/2015: Stage IIA (T3, N0, M0) - Signed by Truitt Merle, MD on 12/22/2015   - Pathologic stage from 03/25/2016: Stage I (yT2, N0, cM0) - Signed by Truitt Merle, MD on 04/22/2016       Rectal cancer ([place])   11/23/2015 Initial Diagnosis    Rectal cancer ([place])      11/23/2015 Procedure    Colonoscopy showed a large fungating ulcerated partially obstructing mass in the proximal rectum. This was biopsied.      11/23/2015 Initial Biopsy    Rectal mass biopsy showed adenocarcinoma      11/25/2015 Imaging    CT chest, abdomen and pelvis with contrast showed focal wall thickening involving the low rectum, no findings suspicious for metastatic disease.      12/09/2015 Procedure    EUS showed a T3 N0 lesion in proximal rectum.      12/22/2015 - 02/01/2016 Radiation Therapy    New adjuvant irradiation to the rectal cancer      12/22/2015 - 02/01/2016 Chemotherapy    Neoadjuvant Xeloda 2000mg  in AM, and 1500mg  in PM with concurrent radiation       03/25/2016 Surgery    Robotic-assisted anterior resection of proximal rectal cancer      03/25/2016 Pathology Results    Rectal sigmoid segmental resection showed adenocarcinoma with muscularis propria and aubmucosa invasion, margins were negative, extensive fibrosis, 12 lymph nodes were negative.      05/04/2016 - 07/23/2016 Chemotherapy    Adjuvant Xeloda 2000mg  bid, 2 weeks on and one week off        12/15/2016 Imaging    CT CAP IMPRESSION: 1. Surgical changes from a prior rectal colon resection and anastomosis. No CT findings to suggest  recurrent tumor or metastatic disease. 2. Presacral soft tissue thickening is most likely postsurgical change and post radiation change. Attention on future scans is suggested. 3. No significant findings involving the chest. No evidence of pulmonary metastatic disease. Stable three-vessel coronary artery calcifications.       HISTORY OF PRESENTING ILLNESS (12/07/2015):  David Jacobs 66 y.o. male is here because of His recently diagnosed rectal cancer. He is accompanied by an interpreter and daughter-in-law [name],  to the clinic today.  He has been having rectal bleeding for 3 months, stable overall, small amount, usually once a day, no pain, he otherwise feels well, no fatigue or weight change lately. He was referred by his primary care physician to gastroenterologist Dr. Paulita Fujita, and underwent colonoscopy on 11/23/2015. It showed a fungating ulcerated partially obstructing mass in the proximal rectum, They showed adenocarcinoma. CT scan revealed no evidence of distant metastasis.He was referred to [place] for further management.  He works for the [employer]still works full-time. He livers with his wife, one of his son and his family. He never had a colonoscopy before he reason one. No family history of colon cancer.    CURRENT THERAPY: Surveillance    INTERIM HISTORY:  [name] returns for follow-up. He presents to the clinic today with his daughter. He reports his BM is not loose but when he does  not use lomotil he will go to the bathroom very often. He takes 2 pills a day. He had not had a repeat colonoscopy yet by Dr. Paulita Fujita. They had no further appointments with Dr. Marcello Moores.     MEDICAL HISTORY:  Past Medical History:  Diagnosis Date  . Hypertension     SURGICAL HISTORY: Past Surgical History:  Procedure Laterality Date  . EUS N/A 12/09/2015   Procedure: LOWER ENDOSCOPIC ULTRASOUND (EUS);  Surgeon: [name], MD;  Location: [facility];  Service: Endoscopy;   Laterality: N/A;  . XI ROBOTIC ASSISTED LOWER ANTERIOR RESECTION N/A 03/25/2016   Procedure: XI ROBOTIC ASSISTED LOWER ANTERIOR RESECTION;  Surgeon: Leighton Ruff, MD;  Location: [facility];  Service: General;  Laterality: N/A;    SOCIAL HISTORY: Social History   Social History  . Marital status: Married    Spouse name: N/A  . Number of children: 2  . Years of education: N/A   Occupational History  . Not on file.   Social History Main Topics  . Smoking status: Current Every Day Smoker    Packs/day: 0.50    Years: 35.00  . Smokeless tobacco: Never Used  . Alcohol use No  . Drug use: No  . Sexual activity: Not on file   Other Topics Concern  . Not on file   Social History Narrative   Married, lives w/his wife and a son--minimal English   Works full time at [employer]   Current everyday smoker, with no plans to quit   Primary contact is daughter-in-law, [name] (can interpret for him)    FAMILY HISTORY: Family History  Problem Relation Age of Onset  . Cancer Mother 52       throid cancer   . Hyperlipidemia Mother   . Diabetes Father     ALLERGIES:  has No Known Allergies.  MEDICATIONS:  Current Outpatient Prescriptions  Medication Sig Dispense Refill  . amLODipine (NORVASC) 5 MG tablet Take 1 tablet (5 mg total) by mouth daily. 90 tablet 2  . diphenoxylate-atropine (LOMOTIL) 2.5-0.025 MG tablet TAKE 1 TABLET BY MOUTH FOUR TIMES DAILY AS NEEDED FOR DIARRHEA OR LOOSE STOOLS 60 tablet 2  . losartan-hydrochlorothiazide (HYZAAR) 100-25 MG tablet Take 1 tablet by mouth daily. 90 tablet 2   No current facility-administered medications for this visit.     REVIEW OF SYSTEMS:   Constitutional: Denies fevers, chills or abnormal night sweats Eyes: Denies blurriness of vision, double vision or watery eyes Ears, nose, mouth, throat, and face: Denies mucositis or sore throat Respiratory: Denies cough, dyspnea or wheezes Cardiovascular: Denies palpitation, chest  discomfort or lower extremity swelling Gastrointestinal:  Denies nausea, heartburn or change in bowel habits Skin: Denies abnormal skin rashes Lymphatics: Denies new lymphadenopathy or easy bruising Neurological:Denies numbness, tingling or new weaknesses Behavioral/Psych: Mood is stable, no new changes  All other systems were reviewed with the patient and are negative.  PHYSICAL EXAMINATION: ECOG PERFORMANCE STATUS: 0  Vitals:   12/22/16 1332  BP: (!) 151/63  Pulse: 78  Resp: 17  Temp: 98.4 F (36.9 C)   Filed Weights   12/22/16 1332  Weight: 214 lb 4.8 oz (97.2 kg)    GENERAL:alert, no distress and comfortable SKIN: skin color, texture, turgor are normal, no rashes or significant lesions EYES: normal, conjunctiva are pink and non-injected, sclera clear OROPHARYNX:no exudate, no erythema and lips, buccal mucosa, and tongue normal  NECK: supple, thyroid normal size, non-tender, without nodularity LYMPH:  no palpable lymphadenopathy in the  cervical, axillary or inguinal LUNGS: clear to auscultation and percussion with normal breathing effort HEART: regular rate & rhythm and no murmurs and no lower extremity edema ABDOMEN:abdomen soft, non-tender and normal bowel sounds, no organomegaly, multiple small surgical scars have healed well. No inguinal lymphadenopathy. Patient declined rectal exam. Musculoskeletal:no cyanosis of digits and no clubbing  PSYCH: alert & oriented x 3 with fluent speech NEURO: no focal motor/sensory deficits GROIN: normal. No lymph nodes felt  LABORATORY DATA:  I have reviewed the data as listed CBC Latest Ref Rng & Units 12/15/2016 10/27/2016 06/28/2016  WBC 4.0 - 10.3 10e3/uL 6.6 6.1 7.0  Hemoglobin 13.0 - 17.1 g/dL 15.0 14.7 13.3  Hematocrit 38.4 - 49.9 % 43.2 42.8 39.6  Platelets 140 - 400 10e3/uL 275 262 271    CMP Latest Ref Rng & Units 12/15/2016 10/27/2016 06/28/2016  Glucose 70 - 140 mg/dl 120 126 113  BUN 7.0 - 26.0 mg/dL 20.2 20.3 17.4    Creatinine 0.7 - 1.3 mg/dL 1.1 1.0 1.0  Sodium 136 - 145 mEq/L 140 139 139  Potassium 3.5 - 5.1 mEq/L 4.1 3.8 4.3  Chloride 101 - 111 mmol/L - - -  CO2 22 - 29 mEq/L 24 26 26   Calcium 8.4 - 10.4 mg/dL 9.6 9.6 9.8  Total Protein 6.4 - 8.3 g/dL 7.0 7.0 7.1  Total Bilirubin 0.20 - 1.20 mg/dL 0.58 0.42 0.77  Alkaline Phos 40 - 150 U/L 59 54 62  AST 5 - 34 U/L 11 14 15   ALT 0 - 55 U/L 15 17 14    Diagnosis 03/25/2016 1. Colon, segmental resection for tumor, rectosigmoid - ADENOCARCINOMA WITHIN MUSCULARIS PROPRIA AND FOCALLY SUBMUCOSA. - MARGINS NOT INVOLVED. - EXTENSIVE FIBROSIS WITH DYSTROPHIC CALCIFICATIONS. - TWELVE BENIGN LYMPH NODES (0/12). - SEE ONCOLOGY TABLE BELOW. 2. Colon, resection margin (donut), final distal - BENIGN COLON. - NO EVIDENCE OF MALIGNANCY.  Microscopic Comment 1. COLON AND RECTUM (INCLUDING TRANS-ANAL RESECTION): Specimen: Rectosigmoid colon. Procedure: Resection. Tumor site: Proximal rectum, right lateral wall. Specimen integrity: Intact. Macroscopic intactness of mesorectum: Complete. Macroscopic tumor perforation: No. Invasive tumor: Maximum size: 0.4 cm, see comment. Histologic type(s): Colorectal adenocarcinoma. Histologic grade and differentiation: G3: poorly differentiated/high grade. Type of polyp in which invasive carcinoma arose: No residual polyp. Microscopic extension of invasive tumor: Microscopic foci within muscularis propria and submucosa. Lymph-Vascular invasion: Not identified. Peri-neural invasion: Not identified. Tumor deposit(s) (discontinuous extramural extension): No. Resection margins Proximal margin: Free of tumor. Distal margin: Free of tumor. Circumferential (radial) (posterior ascending, posterior descending; latera and posterior mid-rectum; and entire lower 1/3 rectum): Free of tumor. Mesenteric margin (sigmoid and transverse): N/A. Distance closest margin (if all above margins negative): 2.8 cm distal margin. Treatment  effect (neo-adjuvant therapy): Yes. Additional polyp(s): No. Non-neoplastic findings: N/A. Lymph nodes: number examined 12; number positive: 0. Pathologic Staging: ypT2, ypN0, ypMX. Ancillary studies: Can be performed if requested. Comments: There is extensive fibrosis with dystrophic calcification which focally extends into the perirectal adipose tissue. There are microscopic foci of residual adenocarcinoma situated mainly in the muscularis propria and focally submucosa. No tumor is identified in the perirectal adipose tissue. Several of the lymph nodes have areas of fibrosis within the nodal parenchyma. (JDP:ds 03/29/16)  PATHOLOGY REPORT: Rectum biopsy 11/23/2015 -Invasive adenocarcinoma  Microscopic comment Interval Departmental review obtained (Dr. Lyndon Code) with agreement.  RADIOGRAPHIC STUDIES: I have personally reviewed the radiological images as listed and agreed with the findings in the report. Ct Chest W Contrast  Result Date: 12/15/2016 CLINICAL DATA:  Restaging rectal cancer. Initial diagnosis May 2017. History of surgical excision and radiation therapy and chemotherapy. EXAM: CT CHEST, ABDOMEN, AND PELVIS WITH CONTRAST TECHNIQUE: Multidetector CT imaging of the chest, abdomen and pelvis was performed following the standard protocol during bolus administration of intravenous contrast. CONTRAST:  118mL ISOVUE-300 IOPAMIDOL (ISOVUE-300) INJECTION 61% COMPARISON:  CT scan 11/25/2015 FINDINGS: CT CHEST FINDINGS Cardiovascular: The heart is normal in size. No pericardial effusion. Stable tortuosity, ectasia and calcification of the thoracic aorta. No focal aneurysm or dissection. The branch vessels are patent. Stable three-vessel coronary artery calcifications. Mediastinum/Nodes: No mediastinal or hilar mass or lymphadenopathy. Small scattered lymph nodes are stable. The esophagus is grossly normal. Lungs/Pleura: No findings for pulmonary metastatic disease. Dependent atelectasis/ edema. No  pleural effusion. No worrisome pleural nodules. Musculoskeletal: No significant bony findings. CT ABDOMEN PELVIS FINDINGS Hepatobiliary: Stable hepatic cyst near the caudate lobe. No worrisome hepatic lesions to suggest hepatic metastatic disease. The portal and hepatic veins are patent. The gallbladder is normal. No common bile duct dilatation. Pancreas: No mass, inflammation or ductal dilatation. Spleen: Normal size.  No focal lesions. Adrenals/Urinary Tract: The adrenal glands and kidneys are unremarkable and stable. No bladder mass or bladder calculi. Stomach/Bowel: The stomach, duodenum, small bowel and colon are unremarkable. No inflammatory changes, mass lesions or obstructive findings. The terminal ileum is normal. The appendix is normal. Surgical changes involving the rectum on prior resection and anastomosis. No obvious CT findings for recurrent tumor. No perirectal or sigmoid mesocolon adenopathy. Presacral and perirectal soft tissue thickening is most likely surgical and radiation change. I do not see an obvious mass. This can be followed on future scans. The ischiorectal fossa appears normal. No mass or adenopathy. Vascular/Lymphatic: Advanced atherosclerotic calcifications involving the distal aorta and iliac arteries but no aneurysm or dissection. The major venous structures are patent. Small scattered mesenteric and retroperitoneal lymph nodes are stable. No mass or overt adenopathy. No pelvic or inguinal adenopathy. Reproductive: The prostate gland and seminal vesicles are unremarkable. Other: No free pelvic fluid collections, inguinal mass or adenopathy. Bilateral inguinal hernias containing fat. There is a small right-sided abdominal wall hernia containing fat. This could be a prior ostomy site. There is also a small periumbilical abdominal wall hernia containing fat. Musculoskeletal: No significant bony findings. Bilateral pars defects at L5 with a grade 1 spondylolisthesis and associated  degenerative disc disease and facet disease at L5-S1. IMPRESSION: 1. Surgical changes from a prior rectal colon resection and anastomosis. No CT findings to suggest recurrent tumor or metastatic disease. 2. Presacral soft tissue thickening is most likely postsurgical change and post radiation change. Attention on future scans is suggested. 3. No significant findings involving the chest. No evidence of pulmonary metastatic disease. Stable three-vessel coronary artery calcifications. Electronically Signed   By: Marijo Sanes M.D.   On: 12/15/2016 15:21   Ct Abdomen Pelvis W Contrast  Result Date: 12/15/2016 CLINICAL DATA:  Restaging rectal cancer. Initial diagnosis May 2017. History of surgical excision and radiation therapy and chemotherapy. EXAM: CT CHEST, ABDOMEN, AND PELVIS WITH CONTRAST TECHNIQUE: Multidetector CT imaging of the chest, abdomen and pelvis was performed following the standard protocol during bolus administration of intravenous contrast. CONTRAST:  168mL ISOVUE-300 IOPAMIDOL (ISOVUE-300) INJECTION 61% COMPARISON:  CT scan 11/25/2015 FINDINGS: CT CHEST FINDINGS Cardiovascular: The heart is normal in size. No pericardial effusion. Stable tortuosity, ectasia and calcification of the thoracic aorta. No focal aneurysm or dissection. The branch vessels are patent. Stable three-vessel coronary artery calcifications. Mediastinum/Nodes: No mediastinal  or hilar mass or lymphadenopathy. Small scattered lymph nodes are stable. The esophagus is grossly normal. Lungs/Pleura: No findings for pulmonary metastatic disease. Dependent atelectasis/ edema. No pleural effusion. No worrisome pleural nodules. Musculoskeletal: No significant bony findings. CT ABDOMEN PELVIS FINDINGS Hepatobiliary: Stable hepatic cyst near the caudate lobe. No worrisome hepatic lesions to suggest hepatic metastatic disease. The portal and hepatic veins are patent. The gallbladder is normal. No common bile duct dilatation. Pancreas: No  mass, inflammation or ductal dilatation. Spleen: Normal size.  No focal lesions. Adrenals/Urinary Tract: The adrenal glands and kidneys are unremarkable and stable. No bladder mass or bladder calculi. Stomach/Bowel: The stomach, duodenum, small bowel and colon are unremarkable. No inflammatory changes, mass lesions or obstructive findings. The terminal ileum is normal. The appendix is normal. Surgical changes involving the rectum on prior resection and anastomosis. No obvious CT findings for recurrent tumor. No perirectal or sigmoid mesocolon adenopathy. Presacral and perirectal soft tissue thickening is most likely surgical and radiation change. I do not see an obvious mass. This can be followed on future scans. The ischiorectal fossa appears normal. No mass or adenopathy. Vascular/Lymphatic: Advanced atherosclerotic calcifications involving the distal aorta and iliac arteries but no aneurysm or dissection. The major venous structures are patent. Small scattered mesenteric and retroperitoneal lymph nodes are stable. No mass or overt adenopathy. No pelvic or inguinal adenopathy. Reproductive: The prostate gland and seminal vesicles are unremarkable. Other: No free pelvic fluid collections, inguinal mass or adenopathy. Bilateral inguinal hernias containing fat. There is a small right-sided abdominal wall hernia containing fat. This could be a prior ostomy site. There is also a small periumbilical abdominal wall hernia containing fat. Musculoskeletal: No significant bony findings. Bilateral pars defects at L5 with a grade 1 spondylolisthesis and associated degenerative disc disease and facet disease at L5-S1. IMPRESSION: 1. Surgical changes from a prior rectal colon resection and anastomosis. No CT findings to suggest recurrent tumor or metastatic disease. 2. Presacral soft tissue thickening is most likely postsurgical change and post radiation change. Attention on future scans is suggested. 3. No significant findings  involving the chest. No evidence of pulmonary metastatic disease. Stable three-vessel coronary artery calcifications. Electronically Signed   By: Marijo Sanes M.D.   On: 12/15/2016 15:21     COLONOSCOPY Dr. Paulita Fujita 11/23/2015  -A front-like villous fungating polypoid sessile and ulcerated partially obstructing large mass was found in the proximal rectum. The mass was partially circumferential (involving one half of the lumen circumference). Bruising was present. This was biopsied. -Non-thrombosed external hemorrhoids -A single nonbleeding clonic angiodysplastic lesion in the hepatic flexure and descending colon  EUS 12/09/2015 - Non-thrombosed external hemorrhoids found on perianal exam. - Malignant partially obstructing tumor in the proximal rectum. - Endosonographic images of the perirectal space were unremarkable. - No lymph nodes were seen in the perirectal region and in a peritumoral location during endosonographic examination. - Rectal mass was visualized endosonographically. A tissue diagnosis was obtained prior to this exam. This is of adenocarcinoma. This was staged uT3 uN0.   ASSESSMENT & PLAN: 66 y.o. presented with intermittent rectal bleeding for 3 months.   1. Proximal rectal adenocarcinoma, uT3N0M0, stage IIA, ypT2N0M0 --I previously discussed his surgical pathology results with patient and his daughter in details -He has had a partial response to neoadjuvant chemoradiation, still has residual T2 disease, nodes were negative. -He has completed adjuvant Xeloda. - Labs reviewed, they are within normal limits. He is clinically doing very well, exam was unremarkable, no cranial concern for  recurrence. -I reviewed her surveillance CT scan from 12/15/2016, which showed no evidence of recurrence. -We will continue cancer surveillance,  which includes routine follow-up with lab and exam every 3-4 months for the first 2-3 years, then every 6-12 months, for total 5 years, and a CT scan  every year and colonoscopy in one year after srugery. -Pt is due for repeat colonoscopy this year in Sep, he will call Dr. Erlinda Hong office for appointment -I encouraged patient to follow-up with Dr. Marcello Moores also   2. HTN -Continue medication and follow up with primary care physician -His blood pressure has been high lately,  I previously  encouraged him to monitor at home -he previously restarted Norvasc   3. Smoking and alcohol cessation -He smokes half pack a day, I previously strongly encouraged him to cut back and quit, he is not interested in smoking cessation. -I also encouraged him to drink alcohol less he agrees  4. Diarrhea  -Probably related to his rectal surgery -No signs of infection. -His diarrhea has much improved with Lomotil, he will continue 1-2 tab daily as needed  -I suggested him to do pelvic exercises to help his pelvic floor. I can refer to PT if he wanted, he declined   Plan  -Will send note to Dr. Marcello Moores about follow up with Pt -Pt will make appointment with Dr. Paulita Fujita for colonoscopy in 03/2017 -Lab and f/u in 4 months    All questions were answered. The patient knows to call the clinic with any problems, questions or concerns.  I spent 20 minutes counseling the patient face to face. The total time spent in the appointment was 25 minutes and more than 50% was on counseling.  This document serves as a record of services personally performed by Truitt Merle, MD. It was created on her behalf by Joslyn Devon, a trained medical scribe. The creation of this record is based on the scribe's personal observations and the provider's statements to them. This document has been checked and approved by the attending provider.    Truitt Merle, MD 12/22/2016 5:21 PM

## 2016-12-22 ENCOUNTER — Encounter: Payer: Self-pay | Admitting: Hematology

## 2016-12-22 ENCOUNTER — Ambulatory Visit (HOSPITAL_BASED_OUTPATIENT_CLINIC_OR_DEPARTMENT_OTHER): Payer: No Typology Code available for payment source | Admitting: Hematology

## 2016-12-22 ENCOUNTER — Telehealth: Payer: Self-pay | Admitting: Hematology

## 2016-12-22 VITALS — BP 151/63 | HR 78 | Temp 98.4°F | Resp 17 | Ht 71.0 in | Wt 214.3 lb

## 2016-12-22 DIAGNOSIS — C20 Malignant neoplasm of rectum: Secondary | ICD-10-CM

## 2016-12-22 DIAGNOSIS — I10 Essential (primary) hypertension: Secondary | ICD-10-CM | POA: Diagnosis not present

## 2016-12-22 DIAGNOSIS — R197 Diarrhea, unspecified: Secondary | ICD-10-CM | POA: Diagnosis not present

## 2016-12-22 NOTE — Telephone Encounter (Signed)
Gave patient AVS and calender per 5/31 LOS - lab and f/u in 4 months.

## 2017-03-17 ENCOUNTER — Ambulatory Visit: Payer: Self-pay | Admitting: *Deleted

## 2017-03-17 VITALS — BP 150/78 | Ht 71.0 in | Wt 213.0 lb

## 2017-03-17 DIAGNOSIS — Z0000 Encounter for general adult medical examination without abnormal findings: Secondary | ICD-10-CM

## 2017-03-17 NOTE — Progress Notes (Signed)
[employer] insurance premium discount evaluation: Labs Drawn. [employer] ROI form signed. Tobacco Free Attestation form signed.  Forms placed in paper chart.  Plan to recheck BP at result review. Pt reports BP being normal at home. Per chart review, pt hypertensive at every appt in 2017 and 2018.  Declines having results routed to pcp.

## 2017-03-18 LAB — CMP12+LP+TP+TSH+6AC+PSA+CBC…
ALT: 13 IU/L (ref 0–44)
AST: 14 IU/L (ref 0–40)
Albumin/Globulin Ratio: 1.8 (ref 1.2–2.2)
Albumin: 4.4 g/dL (ref 3.6–4.8)
Alkaline Phosphatase: 64 IU/L (ref 39–117)
BASOS ABS: 0 10*3/uL (ref 0.0–0.2)
BUN / CREAT RATIO: 15 (ref 10–24)
BUN: 15 mg/dL (ref 8–27)
Basos: 1 %
Bilirubin Total: 0.3 mg/dL (ref 0.0–1.2)
CHLORIDE: 99 mmol/L (ref 96–106)
Calcium: 9.5 mg/dL (ref 8.6–10.2)
Chol/HDL Ratio: 6.1 ratio — ABNORMAL HIGH (ref 0.0–5.0)
Cholesterol, Total: 218 mg/dL — ABNORMAL HIGH (ref 100–199)
Creatinine, Ser: 0.99 mg/dL (ref 0.76–1.27)
EOS (ABSOLUTE): 0.2 10*3/uL (ref 0.0–0.4)
EOS: 4 %
Estimated CHD Risk: 1.3 times avg. — ABNORMAL HIGH (ref 0.0–1.0)
Free Thyroxine Index: 1.9 (ref 1.2–4.9)
GFR calc non Af Amer: 79 mL/min/{1.73_m2} (ref >59)
GFR, EST AFRICAN AMERICAN: 91 mL/min/{1.73_m2} (ref >59)
GGT: 28 IU/L (ref 0–65)
GLUCOSE: 105 mg/dL — AB (ref 65–99)
Globulin, Total: 2.5 g/dL (ref 1.5–4.5)
HDL: 36 mg/dL — ABNORMAL LOW (ref >39)
HEMOGLOBIN: 15.1 g/dL (ref 13.0–17.7)
Hematocrit: 43.9 % (ref 37.5–51.0)
IMMATURE GRANULOCYTES: 0 %
IRON: 113 ug/dL (ref 38–169)
Immature Grans (Abs): 0 10*3/uL (ref 0.0–0.1)
LDH: 139 IU/L (ref 121–224)
LDL CALC: 143 mg/dL — AB (ref 0–99)
LYMPHS ABS: 1.7 10*3/uL (ref 0.7–3.1)
Lymphs: 27 %
MCH: 31.5 pg (ref 26.6–33.0)
MCHC: 34.4 g/dL (ref 31.5–35.7)
MCV: 92 fL (ref 79–97)
Monocytes Absolute: 0.5 10*3/uL (ref 0.1–0.9)
Monocytes: 8 %
NEUTROS PCT: 60 %
Neutrophils Absolute: 3.9 10*3/uL (ref 1.4–7.0)
PHOSPHORUS: 3.1 mg/dL (ref 2.5–4.5)
PLATELETS: 306 10*3/uL (ref 150–379)
PROSTATE SPECIFIC AG, SERUM: 0.6 ng/mL (ref 0.0–4.0)
Potassium: 4.1 mmol/L (ref 3.5–5.2)
RBC: 4.79 x10E6/uL (ref 4.14–5.80)
RDW: 14.5 % (ref 12.3–15.4)
Sodium: 140 mmol/L (ref 134–144)
T3 Uptake Ratio: 26 % (ref 24–39)
T4, Total: 7.4 ug/dL (ref 4.5–12.0)
TSH: 1.88 u[IU]/mL (ref 0.450–4.500)
Total Protein: 6.9 g/dL (ref 6.0–8.5)
Triglycerides: 197 mg/dL — ABNORMAL HIGH (ref 0–149)
URIC ACID: 5.6 mg/dL (ref 3.7–8.6)
VLDL CHOLESTEROL CAL: 39 mg/dL (ref 5–40)
WBC: 6.4 10*3/uL (ref 3.4–10.8)

## 2017-03-18 LAB — HGB A1C W/O EAG: Hgb A1c MFr Bld: 5.9 % — ABNORMAL HIGH (ref 4.8–5.6)

## 2017-03-20 NOTE — Progress Notes (Signed)
Results reviewed with pt with help of translator pt brought with him. A1c and glucose elevated. Lipids elevated. BP elevated, rechecked today 150/80. Discussed diet and exercise recommendations for lipids, A1c, and wt management. Pt failed 2/3 for [employer] discount. Discussed options for him to complete alternative including 4 weeks of weekly checkins with RN with food diary and BP checks, or f/u with pcp to complete physician alternative. Pt thinks he will see his physician. Form given to pt to have pcp complete with plan for BP and lipids. Pt verbalizes understanding and agreement. Copy of labs provided to pt. He declined having results routed to pcp, he will take to [place]. No further questions/concerns.

## 2017-03-31 ENCOUNTER — Encounter: Payer: Self-pay | Admitting: Emergency Medicine

## 2017-03-31 ENCOUNTER — Ambulatory Visit (INDEPENDENT_AMBULATORY_CARE_PROVIDER_SITE_OTHER): Payer: No Typology Code available for payment source | Admitting: Emergency Medicine

## 2017-03-31 DIAGNOSIS — I10 Essential (primary) hypertension: Secondary | ICD-10-CM

## 2017-03-31 DIAGNOSIS — C20 Malignant neoplasm of rectum: Secondary | ICD-10-CM | POA: Diagnosis not present

## 2017-03-31 MED ORDER — LOSARTAN POTASSIUM-HCTZ 100-25 MG PO TABS: 1.0000 | ORAL_TABLET | Freq: Every day | ORAL | 3 refills | Status: DC

## 2017-03-31 MED ORDER — DIPHENOXYLATE-ATROPINE 2.5-0.025 MG PO TABS: ORAL_TABLET | ORAL | 2 refills | Status: DC

## 2017-03-31 MED ORDER — AMLODIPINE BESYLATE 5 MG PO TABS: 5.0000 mg | ORAL_TABLET | Freq: Every day | ORAL | 3 refills | Status: DC

## 2017-03-31 NOTE — Patient Instructions (Addendum)
   IF you received an x-ray today, you will receive an invoice from [facility]. Please contact [facility] at [phone] with questions or concerns regarding your invoice.   IF you received labwork today, you will receive an invoice from [facility]. Please contact [facility] at [phone] with questions or concerns regarding your invoice.   Our billing staff will not be able to assist you with questions regarding bills from these companies.  You will be contacted with the lab results as soon as they are available. The fastest way to get your results is to activate your My Chart account. Instructions are located on the last page of this paperwork. If you have not heard from us regarding the results in 2 weeks, please contact this office.     Hypertension Hypertension is another name for high blood pressure. High blood pressure forces your heart to work harder to pump blood. This can cause problems over time. There are two numbers in a blood pressure reading. There is a top number (systolic) over a bottom number (diastolic). It is best to have a blood pressure below 120/80. Healthy choices can help lower your blood pressure. You may need medicine to help lower your blood pressure if:  Your blood pressure cannot be lowered with healthy choices.  Your blood pressure is higher than 130/80.  Follow these instructions at home: Eating and drinking  If directed, follow the DASH eating plan. This diet includes: ? Filling half of your plate at each meal with fruits and vegetables. ? Filling one quarter of your plate at each meal with whole grains. Whole grains include whole wheat pasta, brown rice, and whole grain bread. ? Eating or drinking low-fat dairy products, such as skim milk or low-fat yogurt. ? Filling one quarter of your plate at each meal with low-fat (lean) proteins. Low-fat proteins include fish, skinless chicken, eggs, beans, and tofu. ? Avoiding fatty meat, cured  and processed meat, or chicken with skin. ? Avoiding premade or processed food.  Eat less than 1,500 mg of salt (sodium) a day.  Limit alcohol use to no more than 1 drink a day for nonpregnant women and 2 drinks a day for men. One drink equals 12 oz of beer, 5 oz of wine, or 1 oz of hard liquor. Lifestyle  Work with your doctor to stay at a healthy weight or to lose weight. Ask your doctor what the best weight is for you.  Get at least 30 minutes of exercise that causes your heart to beat faster (aerobic exercise) most days of the week. This may include walking, swimming, or biking.  Get at least 30 minutes of exercise that strengthens your muscles (resistance exercise) at least 3 days a week. This may include lifting weights or pilates.  Do not use any products that contain nicotine or tobacco. This includes cigarettes and e-cigarettes. If you need help quitting, ask your doctor.  Check your blood pressure at home as told by your doctor.  Keep all follow-up visits as told by your doctor. This is important. Medicines  Take over-the-counter and prescription medicines only as told by your doctor. Follow directions carefully.  Do not skip doses of blood pressure medicine. The medicine does not work as well if you skip doses. Skipping doses also puts you at risk for problems.  Ask your doctor about side effects or reactions to medicines that you should watch for. Contact a doctor if:  You think you are having a reaction to the   medicine you are taking.  You have headaches that keep coming back (recurring).  You feel dizzy.  You have swelling in your ankles.  You have trouble with your vision. Get help right away if:  You get a very bad headache.  You start to feel confused.  You feel weak or numb.  You feel faint.  You get very bad pain in your: ? Chest. ? Belly (abdomen).  You throw up (vomit) more than once.  You have trouble breathing. Summary  Hypertension is  another name for high blood pressure.  Making healthy choices can help lower blood pressure. If your blood pressure cannot be controlled with healthy choices, you may need to take medicine. This information is not intended to replace advice given to you by your health care provider. Make sure you discuss any questions you have with your health care provider. Document Released: 12/28/2007 Document Revised: 06/08/2016 Document Reviewed: 06/08/2016 Elsevier Interactive Patient Education  2018 Elsevier Inc.  

## 2017-03-31 NOTE — Progress Notes (Signed)
David Jacobs 66 y.o.   Chief Complaint  Patient presents with  . Medication Refill    Amlodipine 5 MG, Diphenoxylate-Atropine, HYZAAR     HISTORY OF PRESENT ILLNESS: This is a 66 y.o. male with h/o HTN and rectal cancer needs medication refill; feeling well; no complaints.  Medication Refill  This is a chronic problem. The current episode started more than 1 year ago. Pertinent negatives include no abdominal pain, chest pain, chills, coughing, fever, headaches, myalgias, nausea, neck pain, rash, sore throat, urinary symptoms, vertigo, visual change or vomiting.     Prior to Admission medications   Medication Sig Start Date End Date Taking? Authorizing Provider  amLODipine (NORVASC) 5 MG tablet Take 1 tablet (5 mg total) by mouth daily. 03/31/17  Yes David Pollen, MD  diphenoxylate-atropine (LOMOTIL) 2.5-0.025 MG tablet TAKE 1 TABLET BY MOUTH FOUR TIMES DAILY AS NEEDED FOR DIARRHEA OR LOOSE STOOLS 03/31/17  Yes David Jacobs, David Bloomer, MD  losartan-hydrochlorothiazide (HYZAAR) 100-25 MG tablet Take 1 tablet by mouth daily. 03/31/17  Yes David Bloomer, MD    No Known Allergies  Patient Active Problem List   Diagnosis Date Noted  . Rectal cancer ([place]) 12/07/2015  . HTN (hypertension) 12/07/2015  . Essential hypertension 11/15/2015    Past Medical History:  Diagnosis Date  . Hypertension     Past Surgical History:  Procedure Laterality Date  . EUS N/A 12/09/2015   Procedure: LOWER ENDOSCOPIC ULTRASOUND (EUS);  Surgeon: David Silence, MD;  Location: [facility];  Service: Endoscopy;  Laterality: N/A;  . XI ROBOTIC ASSISTED LOWER ANTERIOR RESECTION N/A 03/25/2016   Procedure: XI ROBOTIC ASSISTED LOWER ANTERIOR RESECTION;  Surgeon: David Ruff, MD;  Location: [facility];  Service: General;  Laterality: N/A;    Social History   Social History  . Marital status: Married    Spouse name: N/A  . Number of children: 2  . Years of education: N/A   Occupational History    . Not on file.   Social History Main Topics  . Smoking status: Current Every Day Smoker    Packs/day: 0.50    Years: 35.00  . Smokeless tobacco: Never Used  . Alcohol use No  . Drug use: No  . Sexual activity: Not on file   Other Topics Concern  . Not on file   Social History Narrative   Married, lives w/his wife and a son--minimal English   Works full time at [employer]   Current everyday smoker, with no plans to quit   Primary contact is daughter-in-law, David Jacobs (can interpret for him)    Family History  Problem Relation Age of Onset  . Cancer Mother 6       throid cancer   . Hyperlipidemia Mother   . Diabetes Father      Review of Systems  Constitutional: Negative.  Negative for chills, fever and weight loss.  HENT: Negative.  Negative for sore throat.   Eyes: Negative.   Respiratory: Negative.  Negative for cough and shortness of breath.   Cardiovascular: Negative.  Negative for chest pain and palpitations.  Gastrointestinal: Negative.  Negative for abdominal pain, blood in stool, diarrhea, nausea and vomiting.  Genitourinary: Negative.  Negative for dysuria and hematuria.  Musculoskeletal: Negative.  Negative for back pain, myalgias and neck pain.  Skin: Negative.  Negative for rash.  Neurological: Negative.  Negative for dizziness, vertigo and headaches.  Endo/Heme/Allergies: Negative.   All other systems reviewed and are negative.  Vitals:  03/31/17 1713 03/31/17 1732  BP: (!) 147/72 130/60  Pulse: 70   Resp: 18   Temp: (!) 97.3 F (36.3 C)   SpO2: 97%      Physical Exam  Constitutional: He is oriented to person, place, and time. He appears well-developed and well-nourished.  HENT:  Head: Normocephalic and atraumatic.  Nose: Nose normal.  Mouth/Throat: Oropharynx is clear and moist.  Eyes: Pupils are equal, round, and reactive to light. Conjunctivae and EOM are normal.  Neck: Normal range of motion. Neck supple. No JVD present.  No thyromegaly present.  Cardiovascular: Normal rate, regular rhythm, normal heart sounds and intact distal pulses.   Pulmonary/Chest: Effort normal and breath sounds normal.  Abdominal: Soft. Bowel sounds are normal. He exhibits no distension. There is no tenderness.  Musculoskeletal: Normal range of motion.  Lymphadenopathy:    He has no cervical adenopathy.  Neurological: He is alert and oriented to person, place, and time. No sensory deficit. He exhibits normal muscle tone.  Skin: Skin is warm and dry. Capillary refill takes less than 2 seconds. No rash noted.  Psychiatric: He has a normal mood and affect. His behavior is normal.  Vitals reviewed.    ASSESSMENT & PLAN: David Jacobs was seen today for medication refill.  Diagnoses and all orders for this visit:  Essential hypertension -     amLODipine (NORVASC) 5 MG tablet; Take 1 tablet (5 mg total) by mouth daily. -     losartan-hydrochlorothiazide (HYZAAR) 100-25 MG tablet; Take 1 tablet by mouth daily.  Rectal cancer ([place]) -     diphenoxylate-atropine (LOMOTIL) 2.5-0.025 MG tablet; TAKE 1 TABLET BY MOUTH FOUR TIMES DAILY AS NEEDED FOR DIARRHEA OR LOOSE STOOLS    Patient Instructions       IF you received an x-ray today, you will receive an invoice from [facility]. Please contact [facility] at [phone] with questions or concerns regarding your invoice.   IF you received labwork today, you will receive an invoice from [facility]. Please contact [facility] at [phone] with questions or concerns regarding your invoice.   Our billing staff will not be able to assist you with questions regarding bills from these companies.  You will be contacted with the lab results as soon as they are available. The fastest way to get your results is to activate your My Chart account. Instructions are located on the last page of this paperwork. If you have not heard from [place] regarding the results in 2 weeks, please contact  this office.     Hypertension Hypertension is another name for high blood pressure. High blood pressure forces your heart to work harder to pump blood. This can cause problems over time. There are two numbers in a blood pressure reading. There is a top number (systolic) over a bottom number (diastolic). It is best to have a blood pressure below 120/80. Healthy choices can help lower your blood pressure. You may need medicine to help lower your blood pressure if:  Your blood pressure cannot be lowered with healthy choices.  Your blood pressure is higher than 130/80.  Follow these instructions at home: Eating and drinking  If directed, follow the DASH eating plan. This diet includes: ? Filling half of your plate at each meal with fruits and vegetables. ? Filling one quarter of your plate at each meal with whole grains. Whole grains include whole wheat pasta, brown rice, and whole grain bread. ? Eating or drinking low-fat dairy products, such as skim milk or  low-fat yogurt. ? Filling one quarter of your plate at each meal with low-fat (lean) proteins. Low-fat proteins include fish, skinless chicken, eggs, beans, and tofu. ? Avoiding fatty meat, cured and processed meat, or chicken with skin. ? Avoiding premade or processed food.  Eat less than 1,500 mg of salt (sodium) a day.  Limit alcohol use to no more than 1 drink a day for nonpregnant women and 2 drinks a day for men. One drink equals 12 oz of beer, 5 oz of wine, or 1 oz of hard liquor. Lifestyle  Work with your doctor to stay at a healthy weight or to lose weight. Ask your doctor what the best weight is for you.  Get at least 30 minutes of exercise that causes your heart to beat faster (aerobic exercise) most days of the week. This may include walking, swimming, or biking.  Get at least 30 minutes of exercise that strengthens your muscles (resistance exercise) at least 3 days a week. This may include lifting weights or  pilates.  Do not use any products that contain nicotine or tobacco. This includes cigarettes and e-cigarettes. If you need help quitting, ask your doctor.  Check your blood pressure at home as told by your doctor.  Keep all follow-up visits as told by your doctor. This is important. Medicines  Take over-the-counter and prescription medicines only as told by your doctor. Follow directions carefully.  Do not skip doses of blood pressure medicine. The medicine does not work as well if you skip doses. Skipping doses also puts you at risk for problems.  Ask your doctor about side effects or reactions to medicines that you should watch for. Contact a doctor if:  You think you are having a reaction to the medicine you are taking.  You have headaches that keep coming back (recurring).  You feel dizzy.  You have swelling in your ankles.  You have trouble with your vision. Get help right away if:  You get a very bad headache.  You start to feel confused.  You feel weak or numb.  You feel faint.  You get very bad pain in your: ? Chest. ? Belly (abdomen).  You throw up (vomit) more than once.  You have trouble breathing. Summary  Hypertension is another name for high blood pressure.  Making healthy choices can help lower blood pressure. If your blood pressure cannot be controlled with healthy choices, you may need to take medicine. This information is not intended to replace advice given to you by your health care provider. Make sure you discuss any questions you have with your health care provider. Document Released: 12/28/2007 Document Revised: 06/08/2016 Document Reviewed: 06/08/2016 Elsevier Interactive Patient Education  2018 Elsevier Inc.      Agustina Caroli, MD [facility]

## 2017-04-10 ENCOUNTER — Telehealth: Payer: Self-pay | Admitting: *Deleted

## 2017-04-10 NOTE — Telephone Encounter (Signed)
Spoke with daughter in-law and informed her of pt keeping lab appt on 04/24/17.    Scheduler will contact pt with new appt with Dr. Burr Medico in 3 months as per md's instructions.   Natasa voiced understanding.

## 2017-04-10 NOTE — Telephone Encounter (Signed)
-----   Message from Truitt Merle, MD sent at 04/10/2017  4:37 PM EDT ----- Meredeth Ide,  Please let pt know that I will reschedule his appointments, see message below   Krista Blue  ----- Message ----- From: Truitt Merle, MD Sent: 04/10/2017   4:32 PM To: Leighton Ruff, MD  Sounds good, I will schedule him to come in for lab this month, and reschedule his visit with me to 3 months.   You do not need to order CEA, I have standing orders including CEA for those patients   Because I can not see your note and pt's f/u appointment with you in Epic, sometime the schedule will be little off, sorry about that.  Thanks  Krista Blue  ----- Message ----- From: Leighton Ruff, MD Sent: 3/43/5686   3:57 PM To: Truitt Merle, MD  I also saw him today.  I think we talked about splitting his visits as well.  I wrote to f/u with me in 6 months.  He is scheduled for a scope later this month and sees you in Oct.  He does need a CEA if you decide to move his Oct visit out 3 months.    Elmo Putt

## 2017-04-18 ENCOUNTER — Telehealth: Payer: Self-pay | Admitting: Hematology

## 2017-04-18 NOTE — Telephone Encounter (Signed)
Attempted to let patient know about his appt, however the number on his account is wrong. Sending him a confirmation letter.

## 2017-04-24 ENCOUNTER — Ambulatory Visit: Payer: Self-pay | Admitting: Hematology

## 2017-04-24 ENCOUNTER — Other Ambulatory Visit: Payer: No Typology Code available for payment source

## 2017-04-24 DIAGNOSIS — C20 Malignant neoplasm of rectum: Secondary | ICD-10-CM

## 2017-04-24 LAB — CBC WITH DIFFERENTIAL/PLATELET
BASO%: 0.9 % (ref 0.0–2.0)
Basophils Absolute: 0.1 10*3/uL (ref 0.0–0.1)
EOS%: 4.1 % (ref 0.0–7.0)
Eosinophils Absolute: 0.3 10*3/uL (ref 0.0–0.5)
HEMATOCRIT: 46.3 % (ref 38.4–49.9)
HGB: 15.8 g/dL (ref 13.0–17.1)
LYMPH#: 2.4 10*3/uL (ref 0.9–3.3)
LYMPH%: 28.7 % (ref 14.0–49.0)
MCH: 31.7 pg (ref 27.2–33.4)
MCHC: 34 g/dL (ref 32.0–36.0)
MCV: 93.3 fL (ref 79.3–98.0)
MONO#: 0.5 10*3/uL (ref 0.1–0.9)
MONO%: 6.2 % (ref 0.0–14.0)
NEUT#: 5 10*3/uL (ref 1.5–6.5)
NEUT%: 60.1 % (ref 39.0–75.0)
Platelets: 309 10*3/uL (ref 140–400)
RBC: 4.97 10*6/uL (ref 4.20–5.82)
RDW: 13.8 % (ref 11.0–14.6)
WBC: 8.3 10*3/uL (ref 4.0–10.3)

## 2017-04-24 LAB — COMPREHENSIVE METABOLIC PANEL
ALBUMIN: 4.1 g/dL (ref 3.5–5.0)
ALK PHOS: 64 U/L (ref 40–150)
ALT: 15 U/L (ref 0–55)
ANION GAP: 10 meq/L (ref 3–11)
AST: 14 U/L (ref 5–34)
BILIRUBIN TOTAL: 0.41 mg/dL (ref 0.20–1.20)
BUN: 13.6 mg/dL (ref 7.0–26.0)
CALCIUM: 9.8 mg/dL (ref 8.4–10.4)
CO2: 25 meq/L (ref 22–29)
Chloride: 103 mEq/L (ref 98–109)
Creatinine: 1 mg/dL (ref 0.7–1.3)
EGFR: 74 mL/min/{1.73_m2} — AB (ref >90)
Glucose: 104 mg/dl (ref 70–140)
Potassium: 4 mEq/L (ref 3.5–5.1)
Sodium: 138 mEq/L (ref 136–145)
Total Protein: 7.3 g/dL (ref 6.4–8.3)

## 2017-04-24 LAB — IRON AND TIBC
%SAT: 30 % (ref 20–55)
IRON: 83 ug/dL (ref 42–163)
TIBC: 280 ug/dL (ref 202–409)
UIBC: 197 ug/dL (ref 117–376)

## 2017-04-24 LAB — CEA (IN HOUSE-CHCC): CEA (CHCC-IN HOUSE): 3.15 ng/mL (ref 0.00–5.00)

## 2017-04-24 LAB — FERRITIN: FERRITIN: 149 ng/mL (ref 22–316)

## 2017-04-28 ENCOUNTER — Telehealth: Payer: Self-pay | Admitting: *Deleted

## 2017-04-28 ENCOUNTER — Other Ambulatory Visit: Payer: Self-pay | Admitting: Hematology

## 2017-04-28 NOTE — Telephone Encounter (Signed)
-----   Message from Truitt Merle, MD sent at 04/28/2017  7:45 AM EDT ----- Please call pt's daughter-in-law and let them know his lab results, thanks  Truitt Merle  04/28/2017

## 2017-04-28 NOTE — Telephone Encounter (Signed)
Natasha (Daughter- in-Law) was informed of pt's normal labs with eGRF sightly low and iron levels normal per Dr Ernestina Penna message.  She was also informed of his next appt in Jan 2019.  She had no questions.

## 2017-07-20 ENCOUNTER — Telehealth: Payer: Self-pay | Admitting: Hematology

## 2017-07-20 NOTE — Telephone Encounter (Signed)
Call day - moved 1/4 appointments to 1/9. Left message for patient via [facility] [phone]. Schedule mailed.

## 2017-07-28 ENCOUNTER — Other Ambulatory Visit: Payer: Self-pay

## 2017-07-28 ENCOUNTER — Ambulatory Visit: Payer: Self-pay | Admitting: Hematology

## 2017-08-02 ENCOUNTER — Encounter: Payer: Self-pay | Admitting: Hematology

## 2017-08-02 ENCOUNTER — Telehealth: Payer: Self-pay | Admitting: Hematology

## 2017-08-02 ENCOUNTER — Inpatient Hospital Stay: Payer: No Typology Code available for payment source | Attending: Hematology

## 2017-08-02 ENCOUNTER — Inpatient Hospital Stay (HOSPITAL_BASED_OUTPATIENT_CLINIC_OR_DEPARTMENT_OTHER): Payer: No Typology Code available for payment source | Admitting: Hematology

## 2017-08-02 VITALS — BP 161/66 | HR 76 | Temp 98.4°F | Resp 20 | Ht 71.0 in | Wt 224.0 lb

## 2017-08-02 DIAGNOSIS — R197 Diarrhea, unspecified: Secondary | ICD-10-CM

## 2017-08-02 DIAGNOSIS — F17210 Nicotine dependence, cigarettes, uncomplicated: Secondary | ICD-10-CM | POA: Insufficient documentation

## 2017-08-02 DIAGNOSIS — I10 Essential (primary) hypertension: Secondary | ICD-10-CM

## 2017-08-02 DIAGNOSIS — Z9221 Personal history of antineoplastic chemotherapy: Secondary | ICD-10-CM | POA: Insufficient documentation

## 2017-08-02 DIAGNOSIS — Z923 Personal history of irradiation: Secondary | ICD-10-CM

## 2017-08-02 DIAGNOSIS — Z808 Family history of malignant neoplasm of other organs or systems: Secondary | ICD-10-CM

## 2017-08-02 DIAGNOSIS — C20 Malignant neoplasm of rectum: Secondary | ICD-10-CM | POA: Diagnosis not present

## 2017-08-02 DIAGNOSIS — I2510 Atherosclerotic heart disease of native coronary artery without angina pectoris: Secondary | ICD-10-CM

## 2017-08-02 DIAGNOSIS — Z79899 Other long term (current) drug therapy: Secondary | ICD-10-CM | POA: Insufficient documentation

## 2017-08-02 LAB — CBC WITH DIFFERENTIAL/PLATELET
Abs Granulocyte: 5.6 10*3/uL (ref 1.5–6.5)
BASOS ABS: 0 10*3/uL (ref 0.0–0.1)
BASOS PCT: 0 %
EOS PCT: 2 %
Eosinophils Absolute: 0.2 10*3/uL (ref 0.0–0.5)
HCT: 43.8 % (ref 38.4–49.9)
Hemoglobin: 15.3 g/dL (ref 13.0–17.1)
Lymphocytes Relative: 27 %
Lymphs Abs: 2.4 10*3/uL (ref 0.9–3.3)
MCH: 32.2 pg (ref 27.2–33.4)
MCHC: 34.9 g/dL (ref 32.0–36.0)
MCV: 92.2 fL (ref 79.3–98.0)
MONO ABS: 0.6 10*3/uL (ref 0.1–0.9)
Monocytes Relative: 7 %
NEUTROS PCT: 64 %
Neutro Abs: 5.6 10*3/uL (ref 1.5–6.5)
PLATELETS: 292 10*3/uL (ref 140–400)
RBC: 4.75 MIL/uL (ref 4.20–5.82)
RDW: 13 % (ref 11.0–15.6)
WBC: 8.8 10*3/uL (ref 4.0–10.3)

## 2017-08-02 LAB — COMPREHENSIVE METABOLIC PANEL
ALT: 17 U/L (ref 0–55)
AST: 14 U/L (ref 5–34)
Albumin: 4.1 g/dL (ref 3.5–5.0)
Alkaline Phosphatase: 60 U/L (ref 40–150)
Anion gap: 10 (ref 3–11)
BUN: 20 mg/dL (ref 7–26)
CO2: 25 mmol/L (ref 22–29)
Calcium: 9.6 mg/dL (ref 8.4–10.4)
Chloride: 104 mmol/L (ref 98–109)
Creatinine, Ser: 1.1 mg/dL (ref 0.70–1.30)
GFR calc Af Amer: >60 mL/min (ref >60)
GFR calc non Af Amer: >60 mL/min (ref >60)
Glucose, Bld: 108 mg/dL (ref 70–140)
Potassium: 3.9 mmol/L (ref 3.5–5.1)
Sodium: 139 mmol/L (ref 136–145)
Total Bilirubin: 0.6 mg/dL (ref 0.2–1.2)
Total Protein: 7.3 g/dL (ref 6.4–8.3)

## 2017-08-02 LAB — CEA (IN HOUSE-CHCC): CEA (CHCC-IN HOUSE): 2.34 ng/mL (ref 0.00–5.00)

## 2017-08-02 NOTE — Telephone Encounter (Signed)
Scheduled appt per 1/9 los - Gave patient AVS and calender per los. - [facility] to contact patient with ct schedule.

## 2017-08-02 NOTE — Progress Notes (Signed)
[place]  [phone] [phone]  Clinic Follow Up Note   Patient Care Team: Horald Pollen, MD as PCP - General (Internal Medicine) Leighton Ruff, MD as Consulting Physician (General Surgery) 08/02/2017   CHIEF COMPLAINTS:  Follow Up rectal cancer  Oncology History   Rectal cancer [facility])   Staging form: Colon and Rectum, AJCC 7th Edition   - Clinical stage from 12/09/2015: Stage IIA (T3, N0, M0) - Signed by Truitt Merle, MD on 12/22/2015   - Pathologic stage from 03/25/2016: Stage I (yT2, N0, cM0) - Signed by Truitt Merle, MD on 04/22/2016       Rectal cancer ([place])   11/23/2015 Initial Diagnosis    Rectal cancer ([place])      11/23/2015 Procedure    Colonoscopy showed a large fungating ulcerated partially obstructing mass in the proximal rectum. This was biopsied.      11/23/2015 Initial Biopsy    Rectal mass biopsy showed adenocarcinoma      11/25/2015 Imaging    CT chest, abdomen and pelvis with contrast showed focal wall thickening involving the low rectum, no findings suspicious for metastatic disease.      12/09/2015 Procedure    EUS showed a T3 N0 lesion in proximal rectum.      12/22/2015 - 02/01/2016 Radiation Therapy    New adjuvant irradiation to the rectal cancer      12/22/2015 - 02/01/2016 Chemotherapy    Neoadjuvant Xeloda 2000mg  in AM, and 1500mg  in PM with concurrent radiation       03/25/2016 Surgery    Robotic-assisted anterior resection of proximal rectal cancer      03/25/2016 Pathology Results    Rectal sigmoid segmental resection showed adenocarcinoma with muscularis propria and aubmucosa invasion, margins were negative, extensive fibrosis, 12 lymph nodes were negative.      05/04/2016 - 07/23/2016 Chemotherapy    Adjuvant Xeloda 2000mg  bid, 2 weeks on and one week off        12/15/2016 Imaging    CT CAP IMPRESSION: 1. Surgical changes from a prior rectal colon resection and anastomosis. No CT findings to suggest  recurrent tumor or metastatic disease. 2. Presacral soft tissue thickening is most likely postsurgical change and post radiation change. Attention on future scans is suggested. 3. No significant findings involving the chest. No evidence of pulmonary metastatic disease. Stable three-vessel coronary artery calcifications.       HISTORY OF PRESENTING ILLNESS (12/07/2015):  David Jacobs 67 y.o. male is here because of His recently diagnosed rectal cancer. He is accompanied by an interpreter and daughter-in-law David Jacobs,  to the clinic today.  He has been having rectal bleeding for 3 months, stable overall, small amount, usually once a day, no pain, he otherwise feels well, no fatigue or weight change lately. He was referred by his primary care physician to gastroenterologist Dr. Paulita Fujita, and underwent colonoscopy on 11/23/2015. It showed a fungating ulcerated partially obstructing mass in the proximal rectum, They showed adenocarcinoma. CT scan revealed no evidence of distant metastasis.He was referred to [place] for further management.  He works for the [employer]still works full-time. He livers with his wife, one of his son and his family. He never had a colonoscopy before he reason one. No family history of colon cancer.    CURRENT THERAPY: Surveillance    INTERIM HISTORY:  Mr. David Jacobs returns for follow-up.He presents to the clinic today with his daughter. He is doing well overall. He is having normal bowel movements and  has a normal appetite. He stopped his antidiarrheic medication 2 months ago. Pt denies stomach bloating or cough. Pt has a recent colonoscopy 2 months ago and results revealed no evidence of cancer reoccurrence.     On review of systems, pt denies fever, chills, rash, mouth sores, weight loss, decreased appetite, urinary complaints. Denies pain. Pt denies abdominal pain, nausea, vomiting. Pertinent positives are listed and detailed within the above HPI.   MEDICAL HISTORY:    Past Medical History:  Diagnosis Date  . Hypertension     SURGICAL HISTORY: Past Surgical History:  Procedure Laterality Date  . EUS N/A 12/09/2015   Procedure: LOWER ENDOSCOPIC ULTRASOUND (EUS);  Surgeon: [name], MD;  Location: [facility];  Service: Endoscopy;  Laterality: N/A;  . XI ROBOTIC ASSISTED LOWER ANTERIOR RESECTION N/A 03/25/2016   Procedure: XI ROBOTIC ASSISTED LOWER ANTERIOR RESECTION;  Surgeon: Leighton Ruff, MD;  Location: [facility];  Service: General;  Laterality: N/A;    SOCIAL HISTORY: Social History   Socioeconomic History  . Marital status: Married    Spouse name: Not on file  . Number of children: 2  . Years of education: Not on file  . Highest education level: Not on file  Social Needs  . Financial resource strain: Not on file  . Food insecurity - worry: Not on file  . Food insecurity - inability: Not on file  . Transportation needs - medical: Not on file  . Transportation needs - non-medical: Not on file  Occupational History  . Not on file  Tobacco Use  . Smoking status: Current Every Day Smoker    Packs/day: 0.50    Years: 35.00    Pack years: 17.50  . Smokeless tobacco: Never Used  Substance and Sexual Activity  . Alcohol use: No    Alcohol/week: 1.2 - 1.8 oz    Types: 2 - 3 Cans of beer per week  . Drug use: No  . Sexual activity: Not on file  Other Topics Concern  . Not on file  Social History Narrative   Married, lives w/his wife and a son--minimal English   Works full time at [employer]   Current everyday smoker, with no plans to quit   Primary contact is daughter-in-law, Lejend Dalby (can interpret for him)    FAMILY HISTORY: Family History  Problem Relation Age of Onset  . Cancer Mother 74       throid cancer   . Hyperlipidemia Mother   . Diabetes Father     ALLERGIES:  has No Known Allergies.  MEDICATIONS:  Current Outpatient Medications  Medication Sig Dispense Refill  . amLODipine (NORVASC) 5 MG  tablet Take 1 tablet (5 mg total) by mouth daily. 90 tablet 3  . losartan-hydrochlorothiazide (HYZAAR) 100-25 MG tablet Take 1 tablet by mouth daily. 90 tablet 3   No current facility-administered medications for this visit.     REVIEW OF SYSTEMS:   Constitutional: Denies fevers, chills or abnormal night sweats Eyes: Denies blurriness of vision, double vision or watery eyes Ears, nose, mouth, throat, and face: Denies mucositis or sore throat Respiratory: Denies cough, dyspnea or wheezes Cardiovascular: Denies palpitation, chest discomfort or lower extremity swelling Gastrointestinal:  Denies nausea, heartburn or change in bowel habits Skin: Denies abnormal skin rashes Lymphatics: Denies new lymphadenopathy or easy bruising Neurological:Denies numbness, tingling or new weaknesses Behavioral/Psych: Mood is stable, no new changes  All other systems were reviewed with the patient and are negative.  PHYSICAL EXAMINATION: ECOG PERFORMANCE STATUS:  0  Vitals:   08/02/17 1408  BP: (!) 161/66  Pulse: 76  Resp: 20  Temp: 98.4 F (36.9 C)  SpO2: 100%   Filed Weights   08/02/17 1408  Weight: 224 lb (101.6 kg)    GENERAL:alert, no distress and comfortable SKIN: skin color, texture, turgor are normal, no rashes or significant lesions EYES: normal, conjunctiva are pink and non-injected, sclera clear OROPHARYNX:no exudate, no erythema and lips, buccal mucosa, and tongue normal  NECK: supple, thyroid normal size, non-tender, without nodularity LYMPH:  no palpable lymphadenopathy in the cervical, axillary or inguinal LUNGS: clear to auscultation and percussion with normal breathing effort HEART: regular rate & rhythm and no murmurs and no lower extremity edema ABDOMEN:abdomen soft, non-tender and normal bowel sounds, no organomegaly, multiple small surgical scars have healed well. No inguinal lymphadenopathy. Patient declined rectal exam. Musculoskeletal:no cyanosis of digits and no  clubbing  PSYCH: alert & oriented x 3 with fluent speech NEURO: no focal motor/sensory deficits GROIN: normal. No lymph nodes felt  LABORATORY DATA:  I have reviewed the data as listed CBC Latest Ref Rng & Units 08/02/2017 04/24/2017 03/17/2017  WBC 4.0 - 10.3 K/uL 8.8 8.3 6.4  Hemoglobin 13.0 - 17.1 g/dL 15.3 15.8 15.1  Hematocrit 38.4 - 49.9 % 43.8 46.3 43.9  Platelets 140 - 400 K/uL 292 309 306    CMP Latest Ref Rng & Units 08/02/2017 04/24/2017 03/17/2017  Glucose 70 - 140 mg/dL 108 104 105(H)  BUN 7 - 26 mg/dL 20 13.6 15  Creatinine 0.70 - 1.30 mg/dL 1.10 1.0 0.99  Sodium 136 - 145 mmol/L 139 138 140  Potassium 3.5 - 5.1 mmol/L 3.9 4.0 4.1  Chloride 98 - 109 mmol/L 104 - 99  CO2 22 - 29 mmol/L 25 25 -  Calcium 8.4 - 10.4 mg/dL 9.6 9.8 9.5  Total Protein 6.4 - 8.3 g/dL 7.3 7.3 6.9  Total Bilirubin 0.2 - 1.2 mg/dL 0.6 0.41 0.3  Alkaline Phos 40 - 150 U/L 60 64 64  AST 5 - 34 U/L 14 14 14   ALT 0 - 55 U/L 17 15 13    Diagnosis 03/25/2016 1. Colon, segmental resection for tumor, rectosigmoid - ADENOCARCINOMA WITHIN MUSCULARIS PROPRIA AND FOCALLY SUBMUCOSA. - MARGINS NOT INVOLVED. - EXTENSIVE FIBROSIS WITH DYSTROPHIC CALCIFICATIONS. - TWELVE BENIGN LYMPH NODES (0/12). - SEE ONCOLOGY TABLE BELOW. 2. Colon, resection margin (donut), final distal - BENIGN COLON. - NO EVIDENCE OF MALIGNANCY.  Microscopic Comment 1. COLON AND RECTUM (INCLUDING TRANS-ANAL RESECTION): Specimen: Rectosigmoid colon. Procedure: Resection. Tumor site: Proximal rectum, right lateral wall. Specimen integrity: Intact. Macroscopic intactness of mesorectum: Complete. Macroscopic tumor perforation: No. Invasive tumor: Maximum size: 0.4 cm, see comment. Histologic type(s): Colorectal adenocarcinoma. Histologic grade and differentiation: G3: poorly differentiated/high grade. Type of polyp in which invasive carcinoma arose: No residual polyp. Microscopic extension of invasive tumor: Microscopic foci within  muscularis propria and submucosa. Lymph-Vascular invasion: Not identified. Peri-neural invasion: Not identified. Tumor deposit(s) (discontinuous extramural extension): No. Resection margins Proximal margin: Free of tumor. Distal margin: Free of tumor. Circumferential (radial) (posterior ascending, posterior descending; latera and posterior mid-rectum; and entire lower 1/3 rectum): Free of tumor. Mesenteric margin (sigmoid and transverse): N/A. Distance closest margin (if all above margins negative): 2.8 cm distal margin. Treatment effect (neo-adjuvant therapy): Yes. Additional polyp(s): No. Non-neoplastic findings: N/A. Lymph nodes: number examined 12; number positive: 0. Pathologic Staging: ypT2, ypN0, ypMX. Ancillary studies: Can be performed if requested. Comments: There is extensive fibrosis with dystrophic calcification which focally extends  into the perirectal adipose tissue. There are microscopic foci of residual adenocarcinoma situated mainly in the muscularis propria and focally submucosa. No tumor is identified in the perirectal adipose tissue. Several of the lymph nodes have areas of fibrosis within the nodal parenchyma. (JDP:ds 03/29/16)  PATHOLOGY REPORT: Rectum biopsy 11/23/2015 -Invasive adenocarcinoma  Microscopic comment Interval Departmental review obtained (Dr. Lyndon Code) with agreement.  RADIOGRAPHIC STUDIES: I have personally reviewed the radiological images as listed and agreed with the findings in the report. No results found.   COLONOSCOPY Dr. Paulita Fujita 11/23/2015  -A front-like villous fungating polypoid sessile and ulcerated partially obstructing large mass was found in the proximal rectum. The mass was partially circumferential (involving one half of the lumen circumference). Bruising was present. This was biopsied. -Non-thrombosed external hemorrhoids -A single nonbleeding clonic angiodysplastic lesion in the hepatic flexure and descending colon  EUS 12/09/2015 -  Non-thrombosed external hemorrhoids found on perianal exam. - Malignant partially obstructing tumor in the proximal rectum. - Endosonographic images of the perirectal space were unremarkable. - No lymph nodes were seen in the perirectal region and in a peritumoral location during endosonographic examination. - Rectal mass was visualized endosonographically. A tissue diagnosis was obtained prior to this exam. This is of adenocarcinoma. This was staged uT3 uN0.   ASSESSMENT & PLAN: 67 y.o. presented with intermittent rectal bleeding for 3 months.   1. Proximal rectal adenocarcinoma, uT3N0M0, stage IIA, ypT2N0M0 --I previously discussed his surgical pathology results with patient and his daughter in details -He has had a partial response to neoadjuvant chemoradiation, still has residual T2 disease, nodes were negative. -He has completed adjuvant Xeloda. - Labs reviewed, they are within normal limits. He is clinically doing very well, exam was unremarkable, no cranial concern for recurrence. -I reviewed her surveillance CT scan from 12/15/2016, which showed no evidence of recurrence. -We will continue cancer surveillance,  which includes routine follow-up with lab and exam every 3-4 months for the first 2-3 years, then every 6-12 months, for total 5 years, and a CT scan every year and colonoscopy in one year after srugery. -Pt is due for repeat colonoscopy this year in Sep, he will call Dr. Erlinda Hong office for appointment -I previously encouraged patient to follow-up with Dr. Marcello Moores.  -Pt had recent Colonoscopy 2 months ago, which revealed no evidence of cancer reoccurrence.  -CEA results are pending today (08/02/17).  -Labs were reviewed with pt today, including normal CBC, liver and kidney function -Is currently doing very well, no symptoms, no clinical concern for recurrence.  Continue surveillance. -Repeat CT in 12/2017  2. HTN -Continue medication and follow up with primary care  physician -His blood pressure has been high lately,  I previously  encouraged him to monitor at home -he previously restarted Norvasc   3. Smoking and alcohol cessation -He smokes half pack a day, I previously strongly encouraged him to cut back and quit, he is not interested in smoking cessation. -I also encouraged him to drink alcohol less he agrees  4. Diarrhea  -Probably related to his rectal surgery -No signs of infection. -His diarrhea has much improved with Lomotil, he will continue 1-2 tab daily as needed  -I suggested him to do pelvic exercises to help his pelvic floor. I can refer to PT if he wanted, he declined  -resolved now   Plan  F/u in 6 months Lab and CT CAP with contrast one week before    All questions were answered. The patient knows to call the clinic  with any problems, questions or concerns.  I spent 15 minutes counseling the patient face to face. The total time spent in the appointment was 20 minutes and more than 50% was on counseling.  This document serves as a record of services personally performed by Truitt Merle, MD. It was created on her behalf by Theresia Bough, a trained medical scribe. The creation of this record is based on the scribe's personal observations and the provider's statements to them.   I have reviewed the above documentation for accuracy and completeness, and I agree with the above.     Truitt Merle, MD 08/02/2017 11:18 PM

## 2017-08-03 ENCOUNTER — Encounter: Payer: Self-pay | Admitting: Hematology

## 2017-08-04 ENCOUNTER — Other Ambulatory Visit: Payer: Self-pay | Admitting: Hematology

## 2017-08-04 DIAGNOSIS — C20 Malignant neoplasm of rectum: Secondary | ICD-10-CM

## 2017-09-11 IMAGING — CT CT CHEST W/ CM
2 of 5 series · 12 of 36 positions shown, 15 images · IV contrast (ISOVUE 300)
Comparison: CT scan 11/25/2015

CLINICAL DATA: Restaging rectal cancer. Initial diagnosis November 2015.
History of surgical excision and radiation therapy and chemotherapy.

EXAM:
CT CHEST, ABDOMEN, AND PELVIS WITH CONTRAST
TECHNIQUE: Multidetector CT imaging of the chest, abdomen and pelvis was
performed following the standard protocol during bolus
administration of intravenous contrast.
CONTRAST:  100mL [id number] IOPAMIDOL ([id number]) INJECTION 61%

[Series 2: cap with · axial · 0.86mm/px · z∈[-615,-70]mm · 9 of 137 slices shown, 12 images]
[im 14/137  mediastinal]
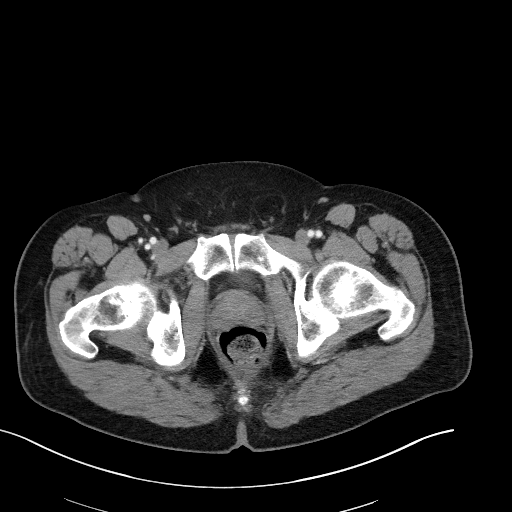
[im 14/137  lung]
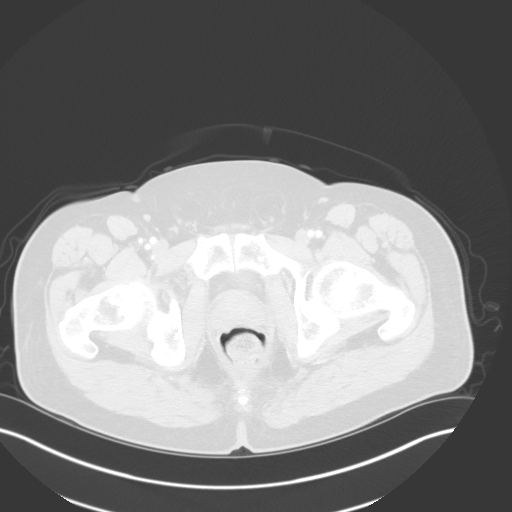
[im 28/137  lung]
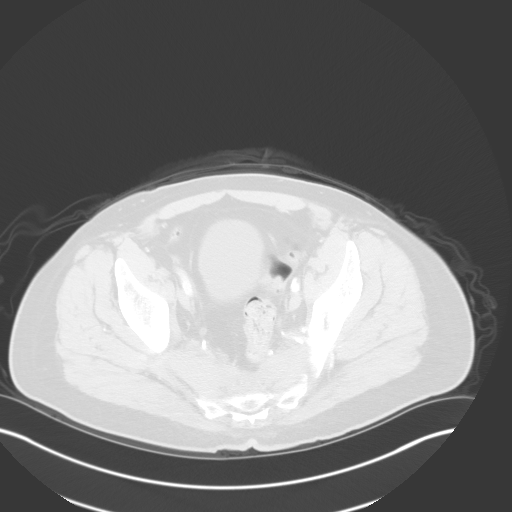
[im 41/137  lung]
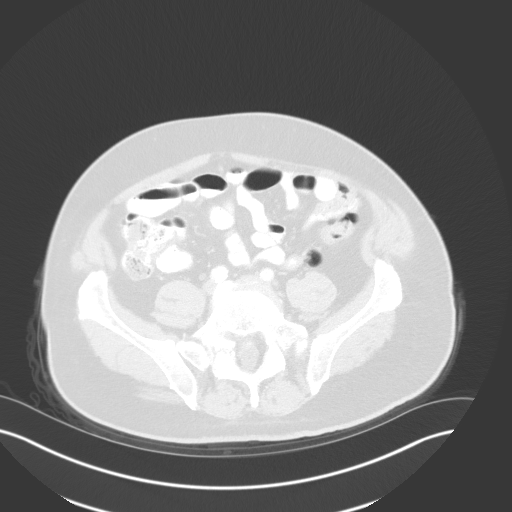
[im 55/137  lung]
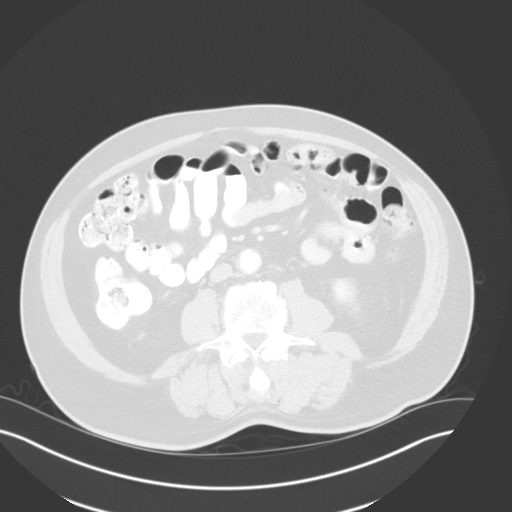
[im 69/137  mediastinal]
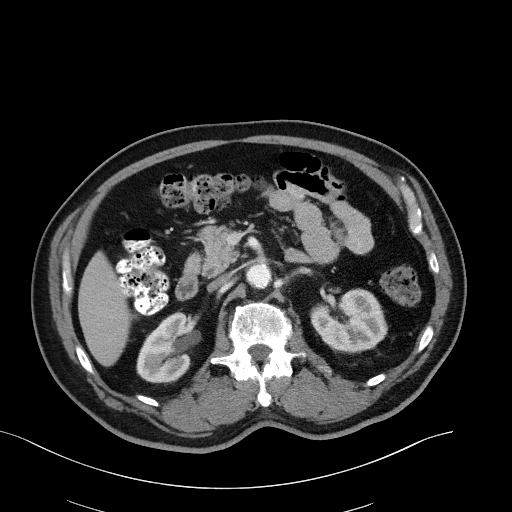
[im 69/137  lung]
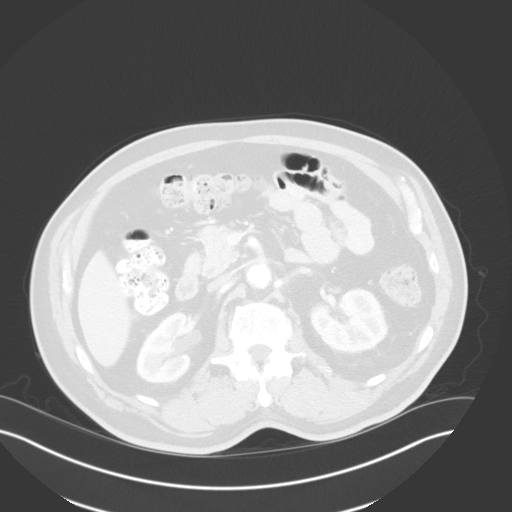
[im 82/137  lung]
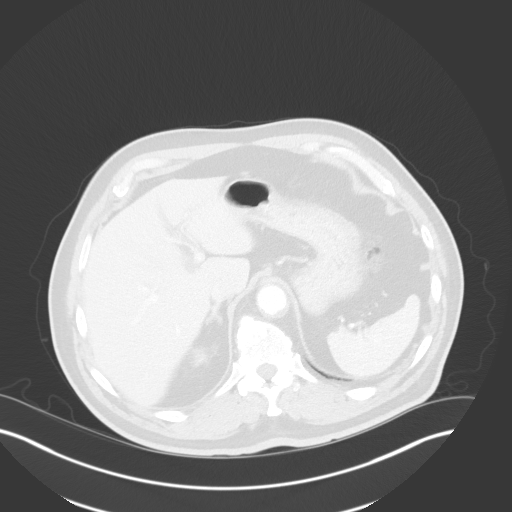
[im 96/137  lung]
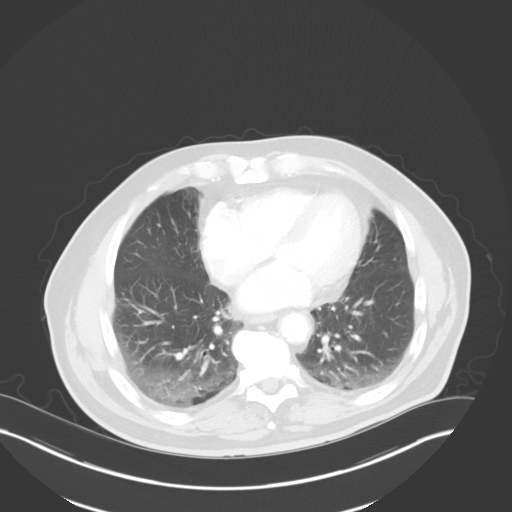
[im 109/137  lung]
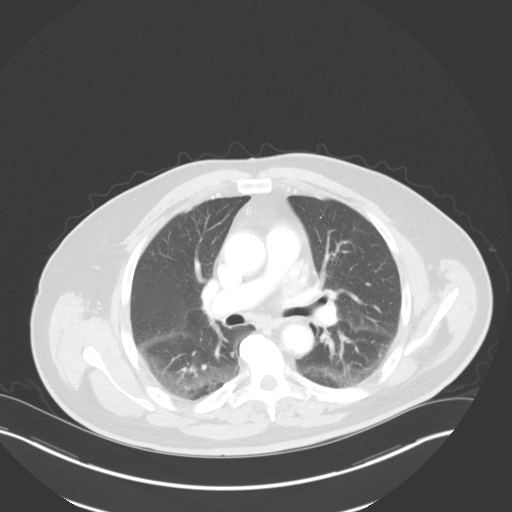
[im 123/137  mediastinal]
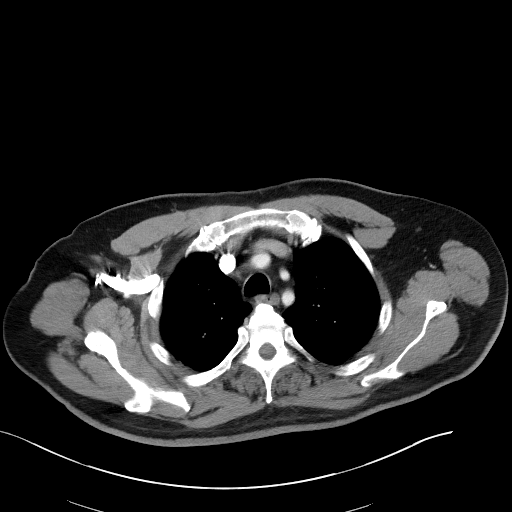
[im 123/137  lung]
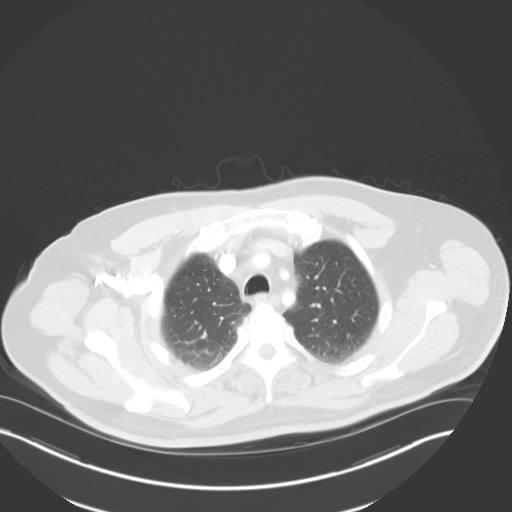

[Series 5: coronals · coronal · 1.03mm/px · 3 of 139 slices shown]
[im 28/139  lung]
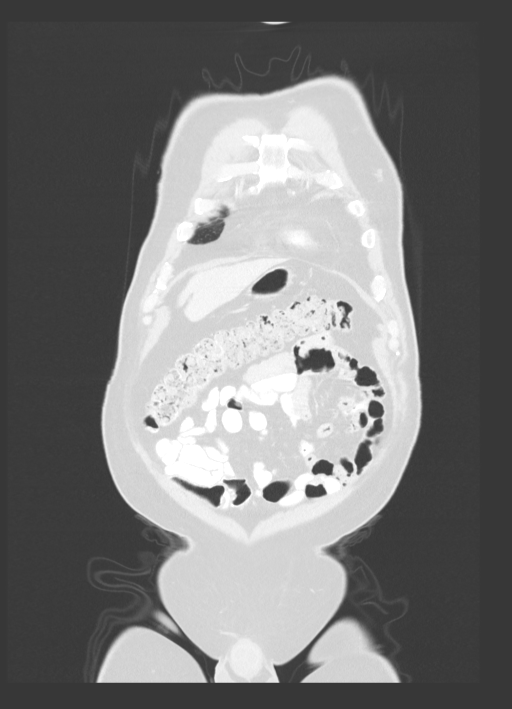
[im 56/139  lung]
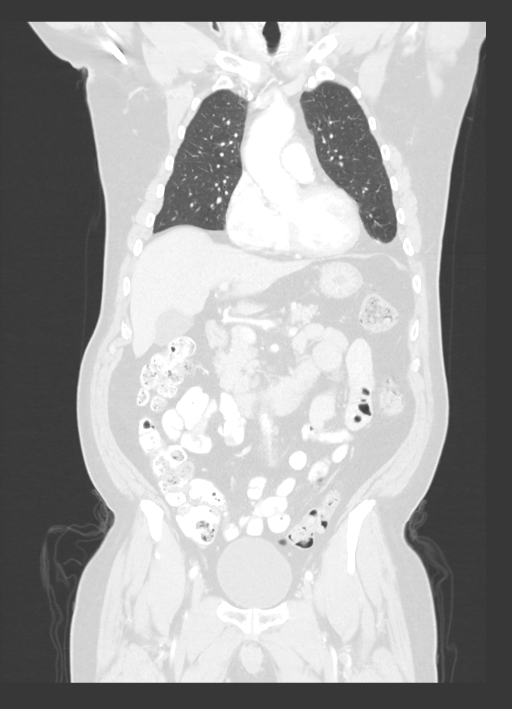
[im 83/139  lung]
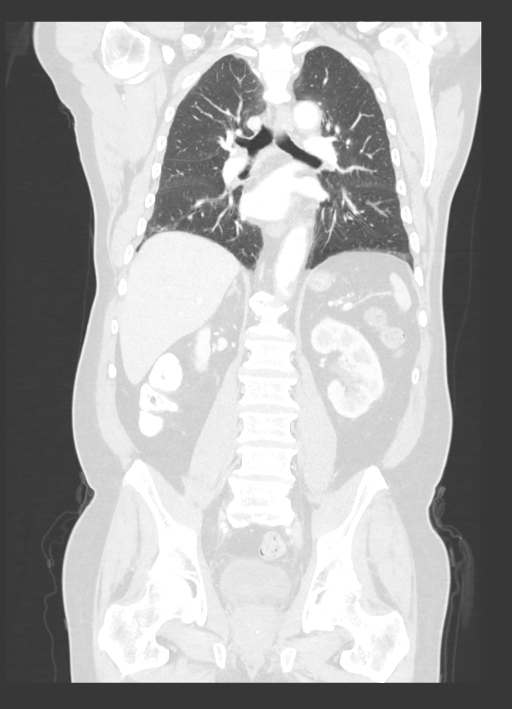

[12 of 36 positions shown; findings below may reference images not displayed]

FINDINGS: CT CHEST FINDINGS

Cardiovascular: The heart is normal in size. No pericardial
effusion. Stable tortuosity, ectasia and calcification of the
thoracic aorta. No focal aneurysm or dissection. The branch vessels
are patent. Stable three-vessel coronary artery calcifications.

Mediastinum/Nodes: No mediastinal or hilar mass or lymphadenopathy.
Small scattered lymph nodes are stable. The esophagus is grossly
normal.

Lungs/Pleura: No findings for pulmonary metastatic disease.
Dependent atelectasis/ edema. No pleural effusion. No worrisome
pleural nodules.

Musculoskeletal: No significant bony findings.

CT ABDOMEN PELVIS FINDINGS

Hepatobiliary: Stable hepatic cyst near the caudate lobe. No
worrisome hepatic lesions to suggest hepatic metastatic disease. The
portal and hepatic veins are patent. The gallbladder is normal. No
common bile duct dilatation.

Pancreas: No mass, inflammation or ductal dilatation.

Spleen: Normal size.  No focal lesions.

Adrenals/Urinary Tract: The adrenal glands and kidneys are
unremarkable and stable. No bladder mass or bladder calculi.

Stomach/Bowel: The stomach, duodenum, small bowel and colon are
unremarkable. No inflammatory changes, mass lesions or obstructive
findings. The terminal ileum is normal. The appendix is normal.

Surgical changes involving the rectum on prior resection and
anastomosis. No obvious CT findings for recurrent tumor. No
perirectal or sigmoid mesocolon adenopathy. Presacral and perirectal
soft tissue thickening is most likely surgical and radiation change.
I do not see an obvious mass. This can be followed on future scans.
The ischiorectal fossa appears normal. No mass or adenopathy.

Vascular/Lymphatic: Advanced atherosclerotic calcifications
involving the distal aorta and iliac arteries but no aneurysm or
dissection. The major venous structures are patent.

Small scattered mesenteric and retroperitoneal lymph nodes are
stable. No mass or overt adenopathy. No pelvic or inguinal
adenopathy.

Reproductive: The prostate gland and seminal vesicles are
unremarkable.

Other: No free pelvic fluid collections, inguinal mass or
adenopathy. Bilateral inguinal hernias containing fat. There is a
small right-sided abdominal wall hernia containing fat. This could
be a prior ostomy site. There is also a small periumbilical
abdominal wall hernia containing fat.

Musculoskeletal: No significant bony findings. Bilateral pars
defects at L5 with a grade 1 spondylolisthesis and associated
degenerative disc disease and facet disease at L5-S1.
IMPRESSION: 1. Surgical changes from a prior rectal colon resection and
anastomosis. No CT findings to suggest recurrent tumor or metastatic
disease.
2. Presacral soft tissue thickening is most likely postsurgical
change and post radiation change. Attention on future scans is
suggested.
3. No significant findings involving the chest. No evidence of
pulmonary metastatic disease. Stable three-vessel coronary artery
calcifications.

## 2018-01-22 ENCOUNTER — Telehealth: Payer: Self-pay | Admitting: Nurse Practitioner

## 2018-01-22 NOTE — Telephone Encounter (Signed)
Called patient regarding 7/2 however may need to reschedule he only took off 7/3. I did provide number for CT scan

## 2018-01-23 ENCOUNTER — Inpatient Hospital Stay: Payer: PRIVATE HEALTH INSURANCE | Attending: Hematology

## 2018-01-23 DIAGNOSIS — Z9221 Personal history of antineoplastic chemotherapy: Secondary | ICD-10-CM | POA: Insufficient documentation

## 2018-01-23 DIAGNOSIS — R197 Diarrhea, unspecified: Secondary | ICD-10-CM | POA: Diagnosis not present

## 2018-01-23 DIAGNOSIS — Z923 Personal history of irradiation: Secondary | ICD-10-CM | POA: Insufficient documentation

## 2018-01-23 DIAGNOSIS — I2510 Atherosclerotic heart disease of native coronary artery without angina pectoris: Secondary | ICD-10-CM | POA: Insufficient documentation

## 2018-01-23 DIAGNOSIS — I700 Atherosclerosis of aorta: Secondary | ICD-10-CM | POA: Diagnosis not present

## 2018-01-23 DIAGNOSIS — I10 Essential (primary) hypertension: Secondary | ICD-10-CM | POA: Insufficient documentation

## 2018-01-23 DIAGNOSIS — C20 Malignant neoplasm of rectum: Secondary | ICD-10-CM | POA: Diagnosis present

## 2018-01-23 DIAGNOSIS — Z79899 Other long term (current) drug therapy: Secondary | ICD-10-CM | POA: Insufficient documentation

## 2018-01-23 LAB — CBC WITH DIFFERENTIAL/PLATELET
Basophils Absolute: 0 10*3/uL (ref 0.0–0.1)
Basophils Relative: 0 %
Eosinophils Absolute: 0.2 10*3/uL (ref 0.0–0.5)
Eosinophils Relative: 2 %
HEMATOCRIT: 44.9 % (ref 38.4–49.9)
HEMOGLOBIN: 15.7 g/dL (ref 13.0–17.1)
LYMPHS ABS: 2 10*3/uL (ref 0.9–3.3)
LYMPHS PCT: 24 %
MCH: 32.4 pg (ref 27.2–33.4)
MCHC: 35 g/dL (ref 32.0–36.0)
MCV: 92.6 fL (ref 79.3–98.0)
Monocytes Absolute: 0.5 10*3/uL (ref 0.1–0.9)
Monocytes Relative: 6 %
NEUTROS ABS: 5.6 10*3/uL (ref 1.5–6.5)
NEUTROS PCT: 68 %
Platelets: 286 10*3/uL (ref 140–400)
RBC: 4.85 MIL/uL (ref 4.20–5.82)
RDW: 13.4 % (ref 11.0–14.6)
WBC: 8.3 10*3/uL (ref 4.0–10.3)

## 2018-01-23 LAB — COMPREHENSIVE METABOLIC PANEL
ALK PHOS: 65 U/L (ref 38–126)
ALT: 19 U/L (ref 0–44)
AST: 14 U/L — ABNORMAL LOW (ref 15–41)
Albumin: 4.2 g/dL (ref 3.5–5.0)
Anion gap: 8 (ref 5–15)
BILIRUBIN TOTAL: 0.4 mg/dL (ref 0.3–1.2)
BUN: 18 mg/dL (ref 8–23)
CALCIUM: 10.1 mg/dL (ref 8.9–10.3)
CO2: 26 mmol/L (ref 22–32)
CREATININE: 1.11 mg/dL (ref 0.61–1.24)
Chloride: 104 mmol/L (ref 98–111)
Glucose, Bld: 137 mg/dL — ABNORMAL HIGH (ref 70–99)
Potassium: 4 mmol/L (ref 3.5–5.1)
SODIUM: 138 mmol/L (ref 135–145)
Total Protein: 7.3 g/dL (ref 6.5–8.1)

## 2018-01-23 LAB — CEA (IN HOUSE-CHCC): CEA (CHCC-In House): 2.78 ng/mL (ref 0.00–5.00)

## 2018-01-24 ENCOUNTER — Ambulatory Visit (HOSPITAL_COMMUNITY)
Admission: RE | Admit: 2018-01-24 | Discharge: 2018-01-24 | Disposition: A | Discharge status: Home / Self Care | Payer: No Typology Code available for payment source | Source: Ambulatory Visit | Attending: Hematology | Admitting: Hematology

## 2018-01-24 ENCOUNTER — Inpatient Hospital Stay: Payer: No Typology Code available for payment source

## 2018-01-24 DIAGNOSIS — I2510 Atherosclerotic heart disease of native coronary artery without angina pectoris: Secondary | ICD-10-CM | POA: Diagnosis not present

## 2018-01-24 DIAGNOSIS — K439 Ventral hernia without obstruction or gangrene: Secondary | ICD-10-CM | POA: Insufficient documentation

## 2018-01-24 DIAGNOSIS — I700 Atherosclerosis of aorta: Secondary | ICD-10-CM | POA: Diagnosis not present

## 2018-01-24 DIAGNOSIS — C20 Malignant neoplasm of rectum: Secondary | ICD-10-CM | POA: Insufficient documentation

## 2018-01-24 MED ORDER — IOPAMIDOL (ISOVUE-300) INJECTION 61%
INTRAVENOUS | Status: AC
  Filled 2018-01-24: qty 100

## 2018-01-24 MED ORDER — IOPAMIDOL (ISOVUE-300) INJECTION 61%
100.0000 mL | Freq: Once | INTRAVENOUS | Status: AC | PRN
  Administered 2018-01-24: 100 mL via INTRAVENOUS

## 2018-01-31 ENCOUNTER — Inpatient Hospital Stay (HOSPITAL_BASED_OUTPATIENT_CLINIC_OR_DEPARTMENT_OTHER): Payer: PRIVATE HEALTH INSURANCE | Admitting: Nurse Practitioner

## 2018-01-31 ENCOUNTER — Encounter: Payer: Self-pay | Admitting: Nurse Practitioner

## 2018-01-31 VITALS — BP 162/72 | HR 68 | Temp 97.5°F | Resp 19 | Ht 71.0 in | Wt 221.8 lb

## 2018-01-31 DIAGNOSIS — I10 Essential (primary) hypertension: Secondary | ICD-10-CM | POA: Diagnosis not present

## 2018-01-31 DIAGNOSIS — R197 Diarrhea, unspecified: Secondary | ICD-10-CM | POA: Diagnosis not present

## 2018-01-31 DIAGNOSIS — C20 Malignant neoplasm of rectum: Secondary | ICD-10-CM | POA: Diagnosis not present

## 2018-01-31 NOTE — Progress Notes (Signed)
[place]  [phone] Fax:([phone]) [phone]  Clinic Follow up Note   Patient Care Team: Horald Pollen, MD as PCP - General (Internal Medicine) Leighton Ruff, MD as Consulting Physician (General Surgery) 01/31/2018  SUMMARY OF ONCOLOGIC HISTORY: Oncology History   Rectal cancer [facility])   Staging form: Colon and Rectum, AJCC 7th Edition   - Clinical stage from 12/09/2015: Stage IIA (T3, N0, M0) - Signed by Truitt Merle, MD on 12/22/2015   - Pathologic stage from 03/25/2016: Stage I (yT2, N0, cM0) - Signed by Truitt Merle, MD on 04/22/2016       Rectal cancer ([place])   11/23/2015 Initial Diagnosis    Rectal cancer ([place])      11/23/2015 Procedure    Colonoscopy showed a large fungating ulcerated partially obstructing mass in the proximal rectum. This was biopsied.      11/23/2015 Initial Biopsy    Rectal mass biopsy showed adenocarcinoma      11/25/2015 Imaging    CT chest, abdomen and pelvis with contrast showed focal wall thickening involving the low rectum, no findings suspicious for metastatic disease.      12/09/2015 Procedure    EUS showed a T3 N0 lesion in proximal rectum.      12/22/2015 - 02/01/2016 Radiation Therapy    New adjuvant irradiation to the rectal cancer      12/22/2015 - 02/01/2016 Chemotherapy    Neoadjuvant Xeloda 2000mg  in AM, and 1500mg  in PM with concurrent radiation       03/25/2016 Surgery    Robotic-assisted anterior resection of proximal rectal cancer      03/25/2016 Pathology Results    Rectal sigmoid segmental resection showed adenocarcinoma with muscularis propria and aubmucosa invasion, margins were negative, extensive fibrosis, 12 lymph nodes were negative.      05/04/2016 - 07/23/2016 Chemotherapy    Adjuvant Xeloda 2000mg  bid, 2 weeks on and one week off        12/15/2016 Imaging    CT CAP IMPRESSION: 1. Surgical changes from a prior rectal colon resection and anastomosis. No CT findings to suggest recurrent tumor  or metastatic disease. 2. Presacral soft tissue thickening is most likely postsurgical change and post radiation change. Attention on future scans is suggested. 3. No significant findings involving the chest. No evidence of pulmonary metastatic disease. Stable three-vessel coronary artery calcifications.      01/24/2018 Imaging    IMPRESSION: 1. No evidence of local recurrence or metastatic disease. Presacral fibrotic changes have mildly improved. 2. Stable small right lower quadrant abdominal wall hernia containing a knuckle of small bowel, possibly at previous ileostomy. 3. Coronary and Aortic Atherosclerosis (ICD10-I70.0). Bilateral L5 pars defects.      CURRENT THERAPY: Surveillance   INTERVAL HISTORY: David Jacobs returns for cancer surveillance visit as scheduled. An interpreter was present. He was last seen 07/2017 by Dr. Burr Medico.  He was due for follow-up with Dr. Marcello Moores last month but MD canceled, he has not rescheduled. He Underwent surveillance CT last week.  He is doing well in the interim, denies changes in his health overall.  Appetite is normal.  Has normal BM daily without blood, pain or difficulty.  Denies abdominal bloating or distention.  Denies pain in general, no fever or chills.  Denies neuropathy.  REVIEW OF SYSTEMS:   Constitutional: Denies fevers, chills, decreased appetite, or abnormal weight loss Ears, nose, mouth, throat, and face: Denies mucositis or sore throat Respiratory: Denies cough, dyspnea or wheezes Cardiovascular: Denies palpitation, chest  discomfort or lower extremity swelling Gastrointestinal:  Denies nausea, vomiting, constipation, diarrhea, hematochezia, abdominal pain, bloating, distention, heartburn or change in bowel habits Skin: Denies abnormal skin rashes Lymphatics: Denies new lymphadenopathy or easy bruising Neurological:Denies numbness, tingling or new weaknesses Behavioral/Psych: Mood is stable, no new changes  All other systems were  reviewed with the patient and are negative.  MEDICAL HISTORY:  Past Medical History:  Diagnosis Date  . Hypertension     SURGICAL HISTORY: Past Surgical History:  Procedure Laterality Date  . EUS N/A 12/09/2015   Procedure: LOWER ENDOSCOPIC ULTRASOUND (EUS);  Surgeon: [name], MD;  Location: [facility];  Service: Endoscopy;  Laterality: N/A;  . XI ROBOTIC ASSISTED LOWER ANTERIOR RESECTION N/A 03/25/2016   Procedure: XI ROBOTIC ASSISTED LOWER ANTERIOR RESECTION;  Surgeon: Leighton Ruff, MD;  Location: [facility];  Service: General;  Laterality: N/A;    I have reviewed the social history and family history with the patient and they are unchanged from previous note.  ALLERGIES:  has No Known Allergies.  MEDICATIONS:  Current Outpatient Medications  Medication Sig Dispense Refill  . amLODipine (NORVASC) 5 MG tablet Take 1 tablet (5 mg total) by mouth daily. 90 tablet 3  . losartan-hydrochlorothiazide (HYZAAR) 100-25 MG tablet Take 1 tablet by mouth daily. 90 tablet 3   No current facility-administered medications for this visit.     PHYSICAL EXAMINATION: ECOG PERFORMANCE STATUS: 0 - Asymptomatic  Vitals:   01/31/18 1435  BP: (!) 162/72  Pulse: 68  Resp: 19  Temp: (!) 97.5 F (36.4 C)  SpO2: 98%   Filed Weights   01/31/18 1435  Weight: 221 lb 12.8 oz (100.6 kg)    GENERAL:alert, no distress and comfortable SKIN: no rashes or significant lesions EYES: normal, Conjunctiva are pink and non-injected, sclera clear OROPHARYNX:no thrush or ulcers LYMPH:  no palpable cervical, supraclavicular, axillary, or inguinal lymphadenopathy  LUNGS: clear to auscultation with normal breathing effort HEART: regular rate & rhythm and no murmurs and no lower extremity edema ABDOMEN:abdomen soft, non-tender and normal bowel sounds Musculoskeletal:no cyanosis of digits and no clubbing  NEURO: alert & oriented x 3 with fluent speech, no focal motor/sensory deficits Rectal: Patient  refused rectal exam  LABORATORY DATA:  I have reviewed the data as listed CBC Latest Ref Rng & Units 01/23/2018 08/02/2017 04/24/2017  WBC 4.0 - 10.3 K/uL 8.3 8.8 8.3  Hemoglobin 13.0 - 17.1 g/dL 15.7 15.3 15.8  Hematocrit 38.4 - 49.9 % 44.9 43.8 46.3  Platelets 140 - 400 K/uL 286 292 309     CMP Latest Ref Rng & Units 01/23/2018 08/02/2017 04/24/2017  Glucose 70 - 99 mg/dL 137(H) 108 104  BUN 8 - 23 mg/dL 18 20 13.6  Creatinine 0.61 - 1.24 mg/dL 1.11 1.10 1.0  Sodium 135 - 145 mmol/L 138 139 138  Potassium 3.5 - 5.1 mmol/L 4.0 3.9 4.0  Chloride 98 - 111 mmol/L 104 104 -  CO2 22 - 32 mmol/L 26 25 25   Calcium 8.9 - 10.3 mg/dL 10.1 9.6 9.8  Total Protein 6.5 - 8.1 g/dL 7.3 7.3 7.3  Total Bilirubin 0.3 - 1.2 mg/dL 0.4 0.6 0.41  Alkaline Phos 38 - 126 U/L 65 60 64  AST 15 - 41 U/L 14(L) 14 14  ALT 0 - 44 U/L 19 17 15    Diagnosis 03/25/2016 1. Colon, segmental resection for tumor, rectosigmoid - ADENOCARCINOMA WITHIN MUSCULARIS PROPRIA AND FOCALLY SUBMUCOSA. - MARGINS NOT INVOLVED. - EXTENSIVE FIBROSIS WITH DYSTROPHIC CALCIFICATIONS. - TWELVE BENIGN  LYMPH NODES (0/12). - SEE ONCOLOGY TABLE BELOW. 2. Colon, resection margin (donut), final distal - BENIGN COLON. - NO EVIDENCE OF MALIGNANCY.  Microscopic Comment 1. COLON AND RECTUM (INCLUDING TRANS-ANAL RESECTION): Specimen: Rectosigmoid colon. Procedure: Resection. Tumor site: Proximal rectum, right lateral wall. Specimen integrity: Intact. Macroscopic intactness of mesorectum: Complete. Macroscopic tumor perforation: No. Invasive tumor: Maximum size: 0.4 cm, see comment. Histologic type(s): Colorectal adenocarcinoma. Histologic grade and differentiation: G3: poorly differentiated/high grade. Type of polyp in which invasive carcinoma arose: No residual polyp. Microscopic extension of invasive tumor: Microscopic foci within muscularis propria and submucosa. Lymph-Vascular invasion: Not identified. Peri-neural invasion: Not  identified. Tumor deposit(s) (discontinuous extramural extension): No. Resection margins Proximal margin: Free of tumor. Distal margin: Free of tumor. Circumferential (radial) (posterior ascending, posterior descending; latera and posterior mid-rectum; and entire lower 1/3 rectum): Free of tumor. Mesenteric margin (sigmoid and transverse): N/A. Distance closest margin (if all above margins negative): 2.8 cm distal margin. Treatment effect (neo-adjuvant therapy): Yes. Additional polyp(s): No. Non-neoplastic findings: N/A. Lymph nodes: number examined 12; number positive: 0. Pathologic Staging: ypT2, ypN0, ypMX. Ancillary studies: Can be performed if requested. Comments: There is extensive fibrosis with dystrophic calcification which focally extends into the perirectal adipose tissue. There are microscopic foci of residual adenocarcinoma situated mainly in the muscularis propria and focally submucosa. No tumor is identified in the perirectal adipose tissue. Several of the lymph nodes have areas of fibrosis within the nodal parenchyma. (JDP:ds 03/29/16)  PATHOLOGY REPORT: Rectum biopsy 11/23/2015 -Invasive adenocarcinoma  Microscopic comment Interval Departmental review obtained (Dr. Lyndon Code) with agreement.    RADIOGRAPHIC STUDIES: I have personally reviewed the radiological images as listed and agreed with the findings in the report. No results found.   ASSESSMENT & PLAN: 67 y.o. presented with intermittent rectal bleeding for 3 months.   1. Proximal rectal adenocarcinoma, uT3N0M0, stage IIA, ypT2N0M0 2.  HTN 3.  Smoking and alcohol cessation 4.  Diarrhea, likely related to rectal surgery  Mr. Hillock is clinically doing well, he is asymptomatic. Physical exam is unremarkable. I reviewed surveillance CT which shows no evidence of local recurrence of distant metastasis. CEA and CBC are normal, CMP is unremarkable. I have no clinical concern for recurrence. I recommend to continue  surveillance. He will r/s f/u with Dr. Marcello Moores. He is not due for repeat colonoscopy until 03/2020. I encouraged him to eat well-balanced healthy diet, increase physical exercise, and maintain f/u with PCP for general health maintenance and disease prevention. Return for lab and f/u with Dr. Burr Medico in 6 months.   PLAN: -Labs and imaging reviewed -Continue surveillance -Patient to make f/u with Dr. Marcello Moores -RTC with lab and f/u with Dr. Burr Medico in 6 months   All questions were answered. The patient knows to call the clinic with any problems, questions or concerns. No barriers to learning was detected. I spent 20 minutes counseling the patient face to face. The total time spent in the appointment was 25 minutes and more than 50% was on counseling and review of test results     Alla Feeling, NP 01/31/18

## 2018-02-01 ENCOUNTER — Telehealth: Payer: Self-pay | Admitting: Hematology

## 2018-02-01 NOTE — Telephone Encounter (Signed)
Appointments scheduled Letter/Calendar mailed and I spoke with [name] patients daughter per 7/10 los

## 2018-03-27 ENCOUNTER — Ambulatory Visit: Payer: Self-pay | Admitting: Registered Nurse

## 2018-03-27 VITALS — BP 146/80 | HR 65

## 2018-03-27 DIAGNOSIS — IMO0001 Reserved for inherently not codable concepts without codable children: Secondary | ICD-10-CM

## 2018-03-27 DIAGNOSIS — R209 Unspecified disturbances of skin sensation: Principal | ICD-10-CM

## 2018-03-27 DIAGNOSIS — R739 Hyperglycemia, unspecified: Secondary | ICD-10-CM

## 2018-03-27 DIAGNOSIS — I10 Essential (primary) hypertension: Secondary | ICD-10-CM

## 2018-03-27 NOTE — Patient Instructions (Addendum)
Obesity, Adult Obesity is the condition of having too much total body fat. Being overweight or obese means that your weight is greater than what is considered healthy for your body size. Obesity is determined by a measurement called BMI. BMI is an estimate of body fat and is calculated from height and weight. For adults, a BMI of 30 or higher is considered obese. Obesity can eventually lead to other health concerns and major illnesses, including:  Stroke.  Coronary artery disease (CAD).  Type 2 diabetes.  Some types of cancer, including cancers of the colon, breast, uterus, and gallbladder.  Osteoarthritis.  High blood pressure (hypertension).  High cholesterol.  Sleep apnea.  Gallbladder stones.  Infertility problems.  What are the causes? The main cause of obesity is taking in (consuming) more calories than your body uses for energy. Other factors that contribute to this condition may include:  Being born with genes that make you more likely to become obese.  Having a medical condition that causes obesity. These conditions include: ? Hypothyroidism. ? Polycystic ovarian syndrome (PCOS). ? Binge-eating disorder. ? Cushing syndrome.  Taking certain medicines, such as steroids, antidepressants, and seizure medicines.  Not being physically active (sedentary lifestyle).  Living where there are limited places to exercise safely or buy healthy foods.  Not getting enough sleep.  What increases the risk? The following factors may increase your risk of this condition:  Having a family history of obesity.  Being a woman of African-American descent.  Being a man of Hispanic descent.  What are the signs or symptoms? Having excessive body fat is the main symptom of this condition. How is this diagnosed? This condition may be diagnosed based on:  Your symptoms.  Your medical history.  A physical exam. Your health care provider may measure: ? Your BMI. If you are an  adult with a BMI between 25 and less than 30, you are considered overweight. If you are an adult with a BMI of 30 or higher, you are considered obese. ? The distances around your hips and your waist (circumferences). These may be compared to each other to help diagnose your condition. ? Your skinfold thickness. Your health care provider may gently pinch a fold of your skin and measure it.  How is this treated? Treatment for this condition often includes changing your lifestyle. Treatment may include some or all of the following:  Dietary changes. Work with your health care provider and a dietitian to set a weight-loss goal that is healthy and reasonable for you. Dietary changes may include eating: ? Smaller portions. A portion size is the amount of a particular food that is healthy for you to eat at one time. This varies from person to person. ? Low-calorie or low-fat options. ? More whole grains, fruits, and vegetables.  Regular physical activity. This may include aerobic activity (cardio) and strength training.  Medicine to help you lose weight. Your health care provider may prescribe medicine if you are unable to lose 1 pound a week after 6 weeks of eating more healthily and doing more physical activity.  Surgery. Surgical options may include gastric banding and gastric bypass. Surgery may be done if: ? Other treatments have not helped to improve your condition. ? You have a BMI of 40 or higher. ? You have life-threatening health problems related to obesity.  Follow these instructions at home:  Eating and drinking   Follow recommendations from your health care provider about what you eat and drink. Your health   care provider may advise you to: ? Limit fast foods, sweets, and processed snack foods. ? Choose low-fat options, such as low-fat milk instead of whole milk. ? Eat 5 or more servings of fruits or vegetables every day. ? Eat at home more often. This gives you more control over  what you eat. ? Choose healthy foods when you eat out. ? Learn what a healthy portion size is. ? Keep low-fat snacks on hand. ? Avoid sugary drinks, such as soda, fruit juice, iced tea sweetened with sugar, and flavored milk. ? Eat a healthy breakfast.  Drink enough water to keep your urine clear or pale yellow.  Do not go without eating for long periods of time (do not fast) or follow a fad diet. Fasting and fad diets can be unhealthy and even dangerous. Physical Activity  Exercise regularly, as told by your health care provider. Ask your health care provider what types of exercise are safe for you and how often you should exercise.  Warm up and stretch before being active.  Cool down and stretch after being active.  Rest between periods of activity. Lifestyle  Limit the time that you spend in front of your TV, computer, or video game system.  Find ways to reward yourself that do not involve food.  Limit alcohol intake to no more than 1 drink a day for nonpregnant women and 2 drinks a day for men. One drink equals 12 oz of beer, 5 oz of wine, or 1 oz of hard liquor. General instructions  Keep a weight loss journal to keep track of the food you eat and how much you exercise you get.  Take over-the-counter and prescription medicines only as told by your health care provider.  Take vitamins and supplements only as told by your health care provider.  Consider joining a support group. Your health care provider may be able to recommend a support group.  Keep all follow-up visits as told by your health care provider. This is important. Contact a health care provider if:  You are unable to meet your weight loss goal after 6 weeks of dietary and lifestyle changes. This information is not intended to replace advice given to you by your health care provider. Make sure you discuss any questions you have with your health care provider. Document Released: 08/18/2004 Document Revised:  12/14/2015 Document Reviewed: 04/29/2015 Elsevier Interactive Patient Education  2018 [employer]. Hypertension Hypertension, commonly called high blood pressure, is when the force of blood pumping through the arteries is too strong. The arteries are the blood vessels that carry blood from the heart throughout the body. Hypertension forces the heart to work harder to pump blood and may cause arteries to become narrow or stiff. Having untreated or uncontrolled hypertension can cause heart attacks, strokes, kidney disease, and other problems. A blood pressure reading consists of a higher number over a lower number. Ideally, your blood pressure should be below 120/80. The first ("top") number is called the systolic pressure. It is a measure of the pressure in your arteries as your heart beats. The second ("bottom") number is called the diastolic pressure. It is a measure of the pressure in your arteries as the heart relaxes. What are the causes? The cause of this condition is not known. What increases the risk? Some risk factors for high blood pressure are under your control. Others are not. Factors you can change  Smoking.  Having type 2 diabetes mellitus, high cholesterol, or both.  Not getting  enough exercise or physical activity.  Being overweight.  Having too much fat, sugar, calories, or salt (sodium) in your diet.  Drinking too much alcohol. Factors that are difficult or impossible to change  Having chronic kidney disease.  Having a family history of high blood pressure.  Age. Risk increases with age.  Race. You may be at higher risk if you are African-American.  Gender. Men are at higher risk than women before age 14. After age 74, women are at higher risk than men.  Having obstructive sleep apnea.  Stress. What are the signs or symptoms? Extremely high blood pressure (hypertensive crisis) may cause:  Headache.  Anxiety.  Shortness of breath.  Nosebleed.  Nausea  and vomiting.  Severe chest pain.  Jerky movements you cannot control (seizures).  How is this diagnosed? This condition is diagnosed by measuring your blood pressure while you are seated, with your arm resting on a surface. The cuff of the blood pressure monitor will be placed directly against the skin of your upper arm at the level of your heart. It should be measured at least twice using the same arm. Certain conditions can cause a difference in blood pressure between your right and left arms. Certain factors can cause blood pressure readings to be lower or higher than normal (elevated) for a short period of time:  When your blood pressure is higher when you are in a health care provider's office than when you are at home, this is called white coat hypertension. Most people with this condition do not need medicines.  When your blood pressure is higher at home than when you are in a health care provider's office, this is called masked hypertension. Most people with this condition may need medicines to control blood pressure.  If you have a high blood pressure reading during one visit or you have normal blood pressure with other risk factors:  You may be asked to return on a different day to have your blood pressure checked again.  You may be asked to monitor your blood pressure at home for 1 week or longer.  If you are diagnosed with hypertension, you may have other blood or imaging tests to help your health care provider understand your overall risk for other conditions. How is this treated? This condition is treated by making healthy lifestyle changes, such as eating healthy foods, exercising more, and reducing your alcohol intake. Your health care provider may prescribe medicine if lifestyle changes are not enough to get your blood pressure under control, and if:  Your systolic blood pressure is above 130.  Your diastolic blood pressure is above 80.  Your personal target blood  pressure may vary depending on your medical conditions, your age, and other factors. Follow these instructions at home: Eating and drinking  Eat a diet that is high in fiber and potassium, and low in sodium, added sugar, and fat. An example eating plan is called the DASH (Dietary Approaches to Stop Hypertension) diet. To eat this way: ? Eat plenty of fresh fruits and vegetables. Try to fill half of your plate at each meal with fruits and vegetables. ? Eat whole grains, such as whole wheat pasta, brown rice, or whole grain bread. Fill about one quarter of your plate with whole grains. ? Eat or drink low-fat dairy products, such as skim milk or low-fat yogurt. ? Avoid fatty cuts of meat, processed or cured meats, and poultry with skin. Fill about one quarter of your plate with lean  proteins, such as fish, chicken without skin, beans, eggs, and tofu. ? Avoid premade and processed foods. These tend to be higher in sodium, added sugar, and fat.  Reduce your daily sodium intake. Most people with hypertension should eat less than 1,500 mg of sodium a day.  Limit alcohol intake to no more than 1 drink a day for nonpregnant women and 2 drinks a day for men. One drink equals 12 oz of beer, 5 oz of wine, or 1 oz of hard liquor. Lifestyle  Work with your health care provider to maintain a healthy body weight or to lose weight. Ask what an ideal weight is for you.  Get at least 30 minutes of exercise that causes your heart to beat faster (aerobic exercise) most days of the week. Activities may include walking, swimming, or biking.  Include exercise to strengthen your muscles (resistance exercise), such as pilates or lifting weights, as part of your weekly exercise routine. Try to do these types of exercises for 30 minutes at least 3 days a week.  Do not use any products that contain nicotine or tobacco, such as cigarettes and e-cigarettes. If you need help quitting, ask your health care  provider.  Monitor your blood pressure at home as told by your health care provider.  Keep all follow-up visits as told by your health care provider. This is important. Medicines  Take over-the-counter and prescription medicines only as told by your health care provider. Follow directions carefully. Blood pressure medicines must be taken as prescribed.  Do not skip doses of blood pressure medicine. Doing this puts you at risk for problems and can make the medicine less effective.  Ask your health care provider about side effects or reactions to medicines that you should watch for. Contact a health care provider if:  You think you are having a reaction to a medicine you are taking.  You have headaches that keep coming back (recurring).  You feel dizzy.  You have swelling in your ankles.  You have trouble with your vision. Get help right away if:  You develop a severe headache or confusion.  You have unusual weakness or numbness.  You feel faint.  You have severe pain in your chest or abdomen.  You vomit repeatedly.  You have trouble breathing. Summary  Hypertension is when the force of blood pumping through your arteries is too strong. If this condition is not controlled, it may put you at risk for serious complications.  Your personal target blood pressure may vary depending on your medical conditions, your age, and other factors. For most people, a normal blood pressure is less than 120/80.  Hypertension is treated with lifestyle changes, medicines, or a combination of both. Lifestyle changes include weight loss, eating a healthy, low-sodium diet, exercising more, and limiting alcohol. This information is not intended to replace advice given to you by your health care provider. Make sure you discuss any questions you have with your health care provider. Document Released: 07/11/2005 Document Revised: 06/08/2016 Document Reviewed: 06/08/2016 Elsevier Interactive Patient  Education  2018 [employer]. Paresthesia Paresthesia is an abnormal burning or prickling sensation. This sensation is generally felt in the hands, arms, legs, or feet. However, it may occur in any part of the body. Usually, it is not painful. The feeling may be described as:  Tingling or numbness.  Pins and needles.  Skin crawling.  Buzzing.  Limbs falling asleep.  Itching.  Most people experience temporary (transient) paresthesia at some time  in their lives. Paresthesia may occur when you breathe too quickly (hyperventilation). It can also occur without any apparent cause. Commonly, paresthesia occurs when pressure is placed on a nerve. The sensation quickly goes away after the pressure is removed. For some people, however, paresthesia is a long-lasting (chronic) condition that is caused by an underlying disorder. If you continue to have paresthesia, you may need further medical evaluation. Follow these instructions at home: Watch your condition for any changes. Taking the following actions may help to lessen any discomfort that you are feeling:  Avoid drinking alcohol.  Try acupuncture or massage to help relieve your symptoms.  Keep all follow-up visits as directed by your health care provider. This is important.  Contact a health care provider if:  You continue to have episodes of paresthesia.  Your burning or prickling feeling gets worse when you walk.  You have pain, cramps, or dizziness.  You develop a rash. Get help right away if:  You feel weak.  You have trouble walking or moving.  You have problems with speech, understanding, or vision.  You feel confused.  You cannot control your bladder or bowel movements.  You have numbness after an injury.  You faint. This information is not intended to replace advice given to you by your health care provider. Make sure you discuss any questions you have with your health care provider. Document Released: 07/01/2002  Document Revised: 12/17/2015 Document Reviewed: 07/07/2014 Dyslipidemia Dyslipidemia is an imbalance of waxy, fat-like substances (lipids) in the blood. The body needs lipids in small amounts. Dyslipidemia often involves a high level of cholesterol or triglycerides, which are types of lipids. Common forms of dyslipidemia include:  High levels of bad cholesterol (LDL cholesterol). LDL is the type of cholesterol that causes fatty deposits (plaques) to build up in the blood vessels that carry blood away from your heart (arteries).  Low levels of good cholesterol (HDL cholesterol). HDL cholesterol is the type of cholesterol that protects against heart disease. High levels of HDL remove the LDL buildup from arteries.  High levels of triglycerides. Triglycerides are a fatty substance in the blood that is linked to a buildup of plaques in the arteries.  You can develop dyslipidemia because of the genes you are born with (primary dyslipidemia) or changes that occur during your life (secondary dyslipidemia), or as a side effect of certain medical treatments. What are the causes? Primary dyslipidemia is caused by changes (mutations) in genes that are passed down through families (inherited). These mutations cause several types of dyslipidemia. Mutations can result in disorders that make the body produce too much LDL cholesterol or triglycerides, or not enough HDL cholesterol. These disorders may lead to heart disease, arterial disease, or stroke at an early age. Causes of secondary dyslipidemia include certain lifestyle choices and diseases that lead to dyslipidemia, such as:  Eating a diet that is high in animal fat.  Not getting enough activity or exercise (having a sedentary lifestyle).  Having diabetes, kidney disease, liver disease, or thyroid disease.  Drinking large amounts of alcohol.  Using certain types of drugs.  What increases the risk? You may be at greater risk for dyslipidemia if you  are an older man or if you are a woman who has gone through menopause. Other risk factors include:  Having a family history of dyslipidemia.  Taking certain medicines, including birth control pills, steroids, some diuretics, beta-blockers, and some medicines forHIV.  Smoking cigarettes.  Eating a high-fat diet.  Drinking large  amounts of alcohol.  Having certain medical conditions such as diabetes, polycystic ovary syndrome (PCOS), pregnancy, kidney disease, liver disease, or hypothyroidism.  Not exercising regularly.  Being overweight or obese with too much belly fat.  What are the signs or symptoms? Dyslipidemia does not usually cause any symptoms. Very high lipid levels can cause fatty bumps under the skin (xanthomas) or a white or gray ring around the black center (pupil) of the eye. Very high triglyceride levels can cause inflammation of the pancreas (pancreatitis). How is this diagnosed? Your health care provider may diagnose dyslipidemia based on a routine blood test (fasting blood test). Because most people do not have symptoms of the condition, this blood testing (lipid profile) is done on adults age 8 and older and is repeated every 5 years. This test checks:  Total cholesterol. This is a measure of the total amount of cholesterol in your blood, including LDL cholesterol, HDL cholesterol, and triglycerides. A healthy number is below 200.  LDL cholesterol. The target number for LDL cholesterol is different for each person, depending on individual risk factors. For most people, a number below 100 is healthy. Ask your health care provider what your LDL cholesterol number should be.  HDL cholesterol. An HDL level of 60 or higher is best because it helps to protect against heart disease. A number below 71 for men or below 78 for women increases the risk for heart disease.  Triglycerides. A healthy triglyceride number is below 150.  If your lipid profile is abnormal, your health  care provider may do other blood tests to get more information about your condition. How is this treated? Treatment depends on the type of dyslipidemia that you have and your other risk factors for heart disease and stroke. Your health care provider will have a target range for your lipid levels based on this information. For many people, treatment starts with lifestyle changes, such as diet and exercise. Your health care provider may recommend that you:  Get regular exercise.  Make changes to your diet.  Quit smoking if you smoke.  If diet changes and exercise do not help you reach your goals, your health care provider may also prescribe medicine to lower lipids. The most commonly prescribed type of medicine lowers your LDL cholesterol (statin drug). If you have a high triglyceride level, your provider may prescribe another type of drug (fibrate) or an omega-3 fish oil supplement, or both. Follow these instructions at home:  Take over-the-counter and prescription medicines only as told by your health care provider. This includes supplements.  Get regular exercise. Start an aerobic exercise and strength training program as told by your health care provider. Ask your health care provider what activities are safe for you. Your health care provider may recommend: ? 30 minutes of aerobic activity 4-6 days a week. Brisk walking is an example of aerobic activity. ? Strength training 2 days a week.  Eat a healthy diet as told by your health care provider. This can help you reach and maintain a healthy weight, lower your LDL cholesterol, and raise your HDL cholesterol. It may help to work with a diet and nutrition specialist (dietitian) to make a plan that is right for you. Your dietitian or health care provider may recommend: ? Limiting your calories, if you are overweight. ? Eating more fruits, vegetables, whole grains, fish, and lean meats. ? Limiting saturated fat, trans fat, and  cholesterol.  Follow instructions from your health care provider or dietitian about eating  or drinking restrictions.  Limit alcohol intake to no more than one drink per day for nonpregnant women and two drinks per day for men. One drink equals 12 oz of beer, 5 oz of wine, or 1 oz of hard liquor.  Do not use any products that contain nicotine or tobacco, such as cigarettes and e-cigarettes. If you need help quitting, ask your health care provider.  Keep all follow-up visits as told by your health care provider. This is important. Contact a health care provider if:  You are having trouble sticking to your exercise or diet plan.  You are struggling to quit smoking or control your use of alcohol. Summary  Dyslipidemia is an imbalance of waxy, fat-like substances (lipids) in the blood. The body needs lipids in small amounts. Dyslipidemia often involves a high level of cholesterol or triglycerides, which are types of lipids.  Treatment depends on the type of dyslipidemia that you have and your other risk factors for heart disease and stroke.  For many people, treatment starts with lifestyle changes, such as diet and exercise. Your health care provider may also prescribe medicine to lower lipids. This information is not intended to replace advice given to you by your health care provider. Make sure you discuss any questions you have with your health care provider. Document Released: 07/16/2013 Document Revised: 03/07/2016 Document Reviewed: 03/07/2016 Elsevier Interactive Patient Education  2018 [employer].  [employer] certified [employer] Patient Education  [employer].

## 2018-03-27 NOTE — Progress Notes (Signed)
Subjective:    Patient ID: David Jacobs, male    DOB: [birth year], 67 y.o.   MRN: [id number]  67y/o [place] male pt that brings a coworker with him to translate for him. He reports that both of his hands have been becoming stiff with mild numbness/tingling for the past approx 3 weeks. Always happens later in the day usually after 7-8pm, never in the morning or during the day. Not every day.  Denies redness, warmth, swelling, weakness associated with the stiffness. His occupation is silver polisher. Recent occupational chemical exposure levels WNL this summer.  He will be starting 50 days of vacation next week and will monitor symptoms to see if related to work schedule.  Due annual follow up for dyslipidemia and hypertension with PCM this month.  PMHx elevated blood sugar     Review of Systems  Constitutional: Negative for activity change, appetite change, chills, diaphoresis, fatigue and fever.  HENT: Negative for trouble swallowing and voice change.   Eyes: Negative for photophobia, pain, discharge and visual disturbance.  Respiratory: Negative for cough, shortness of breath, wheezing and stridor.   Cardiovascular: Negative for chest pain and leg swelling.  Gastrointestinal: Negative for abdominal pain, anal bleeding, blood in stool, constipation, diarrhea, nausea and vomiting.  Endocrine: Negative for cold intolerance and heat intolerance.  Genitourinary: Negative for difficulty urinating and dysuria.  Musculoskeletal: Positive for myalgias. Negative for arthralgias, back pain, gait problem, joint swelling, neck pain and neck stiffness.  Skin: Negative for color change, pallor, rash and wound.  Allergic/Immunologic: Negative for environmental allergies and food allergies.  Neurological: Positive for numbness. Negative for dizziness, tremors, seizures, syncope, facial asymmetry, speech difficulty, weakness, light-headedness and headaches.  Hematological: Negative for adenopathy. Does not  bruise/bleed easily.  Psychiatric/Behavioral: Negative for agitation, confusion and sleep disturbance. The patient is not nervous/anxious.        Objective:   Physical Exam  Constitutional: He is oriented to person, place, and time. Vital signs are normal. He appears well-developed and well-nourished. He is active and cooperative.  Non-toxic appearance. He does not have a sickly appearance. He does not appear ill. No distress.  HENT:  Head: Normocephalic and atraumatic.  Right Ear: Hearing and external ear normal.  Left Ear: Hearing and external ear normal.  Nose: Nose normal.  Mouth/Throat: Uvula is midline, oropharynx is clear and moist and mucous membranes are normal. No oropharyngeal exudate. No tonsillar exudate.  Eyes: Pupils are equal, round, and reactive to light. Conjunctivae, EOM and lids are normal. Right eye exhibits no discharge. Left eye exhibits no discharge. No scleral icterus.  Neck: Trachea normal, normal range of motion and phonation normal. Neck supple. No tracheal deviation present. No thyromegaly present.  Cardiovascular: Normal rate, regular rhythm, normal heart sounds and intact distal pulses. Exam reveals no gallop and no friction rub.  No murmur heard. Pulses:      Radial pulses are 2+ on the right side, and 2+ on the left side.  Pulmonary/Chest: Effort normal and breath sounds normal. No stridor. No respiratory distress. He has no decreased breath sounds. He has no wheezes. He has no rhonchi. He has no rales.  Abdominal: Soft. Normal appearance. He exhibits no distension, no fluid wave and no ascites. There is no rigidity and no guarding.  Musculoskeletal: He exhibits no edema, tenderness or deformity.       Right shoulder: Normal.       Left shoulder: Normal.       Right elbow: Normal.  Left elbow: Normal.       Right wrist: Normal.       Left wrist: He exhibits decreased range of motion. He exhibits no tenderness, no bony tenderness, no swelling, no  effusion, no crepitus, no deformity and no laceration.       Right hip: Normal.       Left hip: Normal.       Right knee: Normal.       Left knee: Normal.       Cervical back: Normal.       Thoracic back: Normal.       Lumbar back: Normal.       Right forearm: Normal.       Left forearm: Normal.       Right hand: Normal.       Left hand: Normal.  Negative tinnels/phalen's bilaterally; old injury scarring left anterior wrist/palm restricted AROM without pain compared to right; right hand dominant  Lymphadenopathy:    He has no cervical adenopathy.  Neurological: He is alert and oriented to person, place, and time. He has normal strength. He displays no atrophy, no tremor and normal reflexes. No cranial nerve deficit or sensory deficit. He exhibits normal muscle tone. He displays no seizure activity. Coordination and gait normal.  Reflex Scores:      Brachioradialis reflexes are 1+ on the right side and 1+ on the left side.      Patellar reflexes are 2+ on the right side and 2+ on the left side. Strength upper and lower extremities 5/5 equal bilaterally; on/off exam table; in/out of chair without difficulty; gait sure and steady in hallway  Skin: Skin is warm, dry and intact. Capillary refill takes less than 2 seconds. No abrasion, no bruising, no burn, no ecchymosis, no laceration, no petechiae and no rash noted. He is not diaphoretic. No cyanosis or erythema. No pallor. Nails show no clubbing.  Psychiatric: He has a normal mood and affect. His speech is normal and behavior is normal. Judgment and thought content normal. He is not actively hallucinating. Cognition and memory are normal. He is attentive.  Nursing note and vitals reviewed.         Assessment & Plan:  A-paresthesias hands, essential hypertension, obesity, dyslipidemia  P-Fasting exec panel with HgbA1c and Vitamin D today.  Follow up in 48 hours for results with RN Hildred Alamin.  Due for annual appt with PCM for lipids and  hypertension.  Discussed DDx/ PMHx elevated blood sugar, age could be arthritis, negative tests for carpal tunnel in exam room today, tendonitis Will monitor symptoms while on vacation to see if they improve and that would indicate overuse/tendenitis most likely cause.  Consider ice 15 minutes TID, elevate after work, biofreeze gel topical QID prn and/or heat epsom salt soak 15 minutes daily.  Exitcare handout paresthesias. Patient verbalized understanding information/instructions, agreed with plan of care and had no further questions at this time.  Continue medications as prescribed.  Had caffeine for breakfast only intake.  Schedule annual follow up with PCM as need new Rx for his medication and bring copy of lab tests with him from [facility] in 48 hours.  Fasting labs today.  Recommend weight loss, exercise 150 minutes per week, dietary fiber 35 grams per day, avoid added sugars/concentrated sugars in diet. exitcare handout on obesity, hypertension, dyslipidemia Patient verbalized understanding information/instructions, agreed with plan of care and had no further questions at this time.

## 2018-03-27 NOTE — Addendum Note (Signed)
Addended by: Beckie Busing on: 03/27/2018 11:26 AM   Modules accepted: Orders

## 2018-03-28 LAB — CMP12+LP+TP+TSH+6AC+PSA+CBC…
ALBUMIN: 4.6 g/dL (ref 3.6–4.8)
ALT: 17 IU/L (ref 0–44)
AST: 13 IU/L (ref 0–40)
Albumin/Globulin Ratio: 1.7 (ref 1.2–2.2)
Alkaline Phosphatase: 63 IU/L (ref 39–117)
BASOS: 1 %
BUN/Creatinine Ratio: 19 (ref 10–24)
BUN: 21 mg/dL (ref 8–27)
Basophils Absolute: 0.1 10*3/uL (ref 0.0–0.2)
Bilirubin Total: 0.4 mg/dL (ref 0.0–1.2)
CHLORIDE: 105 mmol/L (ref 96–106)
CHOL/HDL RATIO: 6.9 ratio — AB (ref 0.0–5.0)
CREATININE: 1.12 mg/dL (ref 0.76–1.27)
Calcium: 9.8 mg/dL (ref 8.6–10.2)
Cholesterol, Total: 235 mg/dL — ABNORMAL HIGH (ref 100–199)
EOS (ABSOLUTE): 0.3 10*3/uL (ref 0.0–0.4)
ESTIMATED CHD RISK: 1.5 times avg. — AB (ref 0.0–1.0)
Eos: 3 %
Free Thyroxine Index: 1.7 (ref 1.2–4.9)
GFR calc non Af Amer: 67 mL/min/{1.73_m2} (ref >59)
GFR, EST AFRICAN AMERICAN: 78 mL/min/{1.73_m2} (ref >59)
GGT: 33 IU/L (ref 0–65)
GLOBULIN, TOTAL: 2.7 g/dL (ref 1.5–4.5)
Glucose: 101 mg/dL — ABNORMAL HIGH (ref 65–99)
HDL: 34 mg/dL — ABNORMAL LOW (ref >39)
HEMATOCRIT: 46.5 % (ref 37.5–51.0)
Hemoglobin: 16 g/dL (ref 13.0–17.7)
IMMATURE GRANS (ABS): 0 10*3/uL (ref 0.0–0.1)
IRON: 115 ug/dL (ref 38–169)
Immature Granulocytes: 0 %
LDH: 136 IU/L (ref 121–224)
LDL Calculated: 139 mg/dL — ABNORMAL HIGH (ref 0–99)
LYMPHS: 29 %
Lymphocytes Absolute: 2.6 10*3/uL (ref 0.7–3.1)
MCH: 31.1 pg (ref 26.6–33.0)
MCHC: 34.4 g/dL (ref 31.5–35.7)
MCV: 91 fL (ref 79–97)
MONOS ABS: 0.6 10*3/uL (ref 0.1–0.9)
Monocytes: 6 %
NEUTROS ABS: 5.6 10*3/uL (ref 1.4–7.0)
Neutrophils: 61 %
Phosphorus: 3.1 mg/dL (ref 2.5–4.5)
Platelets: 321 10*3/uL (ref 150–450)
Potassium: 4.4 mmol/L (ref 3.5–5.2)
Prostate Specific Ag, Serum: 0.6 ng/mL (ref 0.0–4.0)
RBC: 5.14 x10E6/uL (ref 4.14–5.80)
RDW: 13.3 % (ref 12.3–15.4)
SODIUM: 143 mmol/L (ref 134–144)
T3 UPTAKE RATIO: 25 % (ref 24–39)
T4 TOTAL: 6.9 ug/dL (ref 4.5–12.0)
TSH: 3.19 u[IU]/mL (ref 0.450–4.500)
Total Protein: 7.3 g/dL (ref 6.0–8.5)
Triglycerides: 309 mg/dL — ABNORMAL HIGH (ref 0–149)
URIC ACID: 6.2 mg/dL (ref 3.7–8.6)
VLDL Cholesterol Cal: 62 mg/dL — ABNORMAL HIGH (ref 5–40)
WBC: 9.2 10*3/uL (ref 3.4–10.8)

## 2018-03-28 LAB — VITAMIN D 25 HYDROXY (VIT D DEFICIENCY, FRACTURES): Vit D, 25-Hydroxy: 19.1 ng/mL — ABNORMAL LOW (ref 30.0–100.0)

## 2018-03-28 LAB — HGB A1C W/O EAG: HEMOGLOBIN A1C: 6.2 % — AB (ref 4.8–5.6)

## 2018-03-30 ENCOUNTER — Ambulatory Visit: Payer: No Typology Code available for payment source | Admitting: Emergency Medicine

## 2018-03-30 ENCOUNTER — Encounter: Payer: Self-pay | Admitting: Emergency Medicine

## 2018-03-30 VITALS — BP 154/76 | HR 63 | Temp 98.1°F | Resp 16 | Ht 71.0 in | Wt 217.6 lb

## 2018-03-30 DIAGNOSIS — R7303 Prediabetes: Secondary | ICD-10-CM | POA: Diagnosis not present

## 2018-03-30 DIAGNOSIS — E785 Hyperlipidemia, unspecified: Secondary | ICD-10-CM

## 2018-03-30 DIAGNOSIS — R7989 Other specified abnormal findings of blood chemistry: Secondary | ICD-10-CM | POA: Diagnosis not present

## 2018-03-30 DIAGNOSIS — I10 Essential (primary) hypertension: Secondary | ICD-10-CM | POA: Diagnosis not present

## 2018-03-30 DIAGNOSIS — E119 Type 2 diabetes mellitus without complications: Secondary | ICD-10-CM | POA: Insufficient documentation

## 2018-03-30 MED ORDER — ROSUVASTATIN CALCIUM 20 MG PO TABS: 20.0000 mg | ORAL_TABLET | Freq: Every day | ORAL | 3 refills | Status: DC

## 2018-03-30 MED ORDER — LOSARTAN POTASSIUM-HCTZ 100-25 MG PO TABS: 1.0000 | ORAL_TABLET | Freq: Every day | ORAL | 3 refills | Status: DC

## 2018-03-30 MED ORDER — METFORMIN HCL 500 MG PO TABS: 500.0000 mg | ORAL_TABLET | Freq: Two times a day (BID) | ORAL | 3 refills | Status: DC

## 2018-03-30 MED ORDER — AMLODIPINE BESYLATE 5 MG PO TABS: 5.0000 mg | ORAL_TABLET | Freq: Every day | ORAL | 3 refills | Status: DC

## 2018-03-30 NOTE — Patient Instructions (Addendum)
Cholesterol Cholesterol is a fat. Your body needs a small amount of cholesterol. Cholesterol (plaque) may build up in your blood vessels (arteries). That makes you more likely to have a heart attack or stroke. You cannot feel your cholesterol level. Having a blood test is the only way to find out if your level is high. Keep your test results. Work with your doctor to keep your cholesterol at a good level. What do the results mean?  Total cholesterol is how much cholesterol is in your blood.  LDL is bad cholesterol. This is the type that can build up. Try to have low LDL.  HDL is good cholesterol. It cleans your blood vessels and carries LDL away. Try to have high HDL.  Triglycerides are fat that the body can store or burn for energy. What are good levels of cholesterol?  Total cholesterol below 200.  LDL below 100 is good for people who have health risks. LDL below 70 is good for people who have very high risks.  HDL above 40 is good. It is best to have HDL of 60 or higher.  Triglycerides below 150. How can I lower my cholesterol? Diet Follow your diet program as told by your doctor.  Choose fish, white meat chicken, or [place] that is roasted or baked. Try not to eat red meat, fried foods, sausage, or lunch meats.  Eat lots of fresh fruits and vegetables.  Choose whole grains, beans, pasta, potatoes, and cereals.  Choose olive oil, corn oil, or canola oil. Only use small amounts.  Try not to eat butter, mayonnaise, shortening, or palm kernel oils.  Try not to eat foods with trans fats.  Choose low-fat or nonfat dairy foods. ? Drink skim or nonfat milk. ? Eat low-fat or nonfat yogurt and cheeses. ? Try not to drink whole milk or cream. ? Try not to eat ice cream, egg yolks, or full-fat cheeses.  Healthy desserts include angel food cake, ginger snaps, animal crackers, hard candy, popsicles, and low-fat or nonfat frozen yogurt. Try not to eat pastries, cakes, pies, and  cookies.  Exercise Follow your exercise program as told by your doctor.  Be more active. Try gardening, walking, and taking the stairs.  Ask your doctor about ways that you can be more active.  Medicine  Take over-the-counter and prescription medicines only as told by your doctor. This information is not intended to replace advice given to you by your health care provider. Make sure you discuss any questions you have with your health care provider. Document Released: 10/07/2008 Document Revised: 02/10/2016 Document Reviewed: 01/21/2016 Elsevier Interactive Patient Education  2018 [employer].  Vitamin D Deficiency Vitamin D deficiency is when your body does not have enough vitamin D. Vitamin D is important because:  It helps your body use other minerals that your body needs.  It helps keep your bones strong and healthy.  It may help to prevent some diseases.  It helps your heart and other muscles work well.  You can get vitamin D by:  Eating foods with vitamin D in them.  Drinking or eating milk or other foods that have had vitamin D added to them.  Taking a vitamin D supplement.  Being in the sun.  Not getting enough vitamin D can make your bones become soft. It can also cause other health problems. Follow these instructions at home:  Take medicines and supplements only as told by your doctor.  Eat foods that have vitamin D. These include: ?  Dairy products, cereals, or juices with added vitamin D. Check the label for vitamin D. ? Fatty fish like salmon or trout. ? Eggs. ? Oysters.  Do not use tanning beds.  Stay at a healthy weight. Lose weight, if needed.  Keep all follow-up visits as told by your doctor. This is important. Contact a doctor if:  Your symptoms do not go away.  You feel sick to your stomach (nauseous).  Youthrow up (vomit).  You poop less often than usual or you have trouble pooping (constipation). This information is not intended to  replace advice given to you by your health care provider. Make sure you discuss any questions you have with your health care provider. Document Released: 06/30/2011 Document Revised: 12/17/2015 Document Reviewed: 11/26/2014 Elsevier Interactive Patient Education  2018 [employer]. Prediabetes Eating Plan Prediabetes-also called impaired glucose tolerance or impaired fasting glucose-is a condition that causes blood sugar (blood glucose) levels to be higher than normal. Following a healthy diet can help to keep prediabetes under control. It can also help to lower the risk of type 2 diabetes and heart disease, which are increased in people who have prediabetes. Along with regular exercise, a healthy diet:  Promotes weight loss.  Helps to control blood sugar levels.  Helps to improve the way that the body uses insulin.  What do I need to know about this eating plan?  Use the glycemic index (GI) to plan your meals. The index tells you how quickly a food will raise your blood sugar. Choose low-GI foods. These foods take a longer time to raise blood sugar.  Pay close attention to the amount of carbohydrates in the food that you eat. Carbohydrates increase blood sugar levels.  Keep track of how many calories you take in. Eating the right amount of calories will help you to achieve a healthy weight. Losing about 7 percent of your starting weight can help to prevent type 2 diabetes.  You may want to follow a Mediterranean diet. This diet includes a lot of vegetables, lean meats or fish, whole grains, fruits, and healthy oils and fats. What foods can I eat? Grains Whole grains, such as whole-wheat or whole-grain breads, crackers, cereals, and pasta. Unsweetened oatmeal. Bulgur. Barley. Quinoa. Brown rice. Corn or whole-wheat flour tortillas or taco shells. Vegetables Lettuce. Spinach. Peas. Beets. Cauliflower. Cabbage. Broccoli. Carrots. Tomatoes. Squash. Eggplant. Herbs. Peppers. Onions. Cucumbers.  Brussels sprouts. Fruits Berries. Bananas. Apples. Oranges. Grapes. Papaya. Mango. Pomegranate. Kiwi. Grapefruit. Cherries. Meats and Other Protein Sources Seafood. Lean meats, such as chicken and [place] or lean cuts of pork and beef. Tofu. Eggs. Nuts. Beans. Dairy Low-fat or fat-free dairy products, such as yogurt, cottage cheese, and cheese. Beverages Water. Tea. Coffee. Sugar-free or diet soda. Seltzer water. Milk. Milk alternatives, such as soy or almond milk. Condiments Mustard. Relish. Low-fat, low-sugar ketchup. Low-fat, low-sugar barbecue sauce. Low-fat or fat-free mayonnaise. Sweets and Desserts Sugar-free or low-fat pudding. Sugar-free or low-fat ice cream and other frozen treats. Fats and Oils Avocado. Walnuts. Olive oil. The items listed above may not be a complete list of recommended foods or beverages. Contact your dietitian for more options. What foods are not recommended? Grains Refined white flour and flour products, such as bread, pasta, snack foods, and cereals. Beverages Sweetened drinks, such as sweet iced tea and soda. Sweets and Desserts Baked goods, such as cake, cupcakes, pastries, cookies, and cheesecake. The items listed above may not be a complete list of foods and beverages to avoid. Contact your  dietitian for more information. This information is not intended to replace advice given to you by your health care provider. Make sure you discuss any questions you have with your health care provider. Document Released: 11/25/2014 Document Revised: 12/17/2015 Document Reviewed: 08/06/2014 Elsevier Interactive Patient Education  2017 [employer].

## 2018-03-30 NOTE — Progress Notes (Signed)
Lab Results  Component Value Date   CHOL 235 (H) 03/27/2018   HDL 34 (L) 03/27/2018   LDLCALC 139 (H) 03/27/2018   TRIG 309 (H) 03/27/2018   CHOLHDL 6.9 (H) 03/27/2018   Lab Results  Component Value Date   HGBA1C 6.2 (H) 03/27/2018   BP Readings from Last 3 Encounters:  03/27/18 (!) 146/80  01/31/18 (!) 162/72  08/02/17 (!) 161/66   David Jacobs 67 y.o.   No chief complaint on file.   HISTORY OF PRESENT ILLNESS: This is a 68 y.o. male with history of hypertension recently found to have high cholesterol and triglycerides along with prediabetes and low vitamin D levels.  Here for treatment.  Has been complaining of tingling numbness of tip of the fingers and toes for the past several weeks.  No other significant symptomatology  HPI   Prior to Admission medications   Medication Sig Start Date End Date Taking? Authorizing Provider  amLODipine (NORVASC) 5 MG tablet Take 1 tablet (5 mg total) by mouth daily. 03/31/17   Horald Pollen, MD  losartan-hydrochlorothiazide (HYZAAR) 100-25 MG tablet Take 1 tablet by mouth daily. 03/31/17   Horald Pollen, MD    No Known Allergies  Patient Active Problem List   Diagnosis Date Noted  . Rectal cancer ([place]) 12/07/2015  . HTN (hypertension) 12/07/2015  . Essential hypertension 11/15/2015    Past Medical History:  Diagnosis Date  . Hypertension     Past Surgical History:  Procedure Laterality Date  . EUS N/A 12/09/2015   Procedure: LOWER ENDOSCOPIC ULTRASOUND (EUS);  Surgeon: [name], MD;  Location: [facility];  Service: Endoscopy;  Laterality: N/A;  . XI ROBOTIC ASSISTED LOWER ANTERIOR RESECTION N/A 03/25/2016   Procedure: XI ROBOTIC ASSISTED LOWER ANTERIOR RESECTION;  Surgeon: Leighton Ruff, MD;  Location: [facility];  Service: General;  Laterality: N/A;    Social History   Socioeconomic History  . Marital status: Married    Spouse name: Not on file  . Number of children: 2  . Years of education: Not on file   . Highest education level: Not on file  Occupational History  . Not on file  Social Needs  . Financial resource strain: Not on file  . Food insecurity:    Worry: Not on file    Inability: Not on file  . Transportation needs:    Medical: Not on file    Non-medical: Not on file  Tobacco Use  . Smoking status: Current Every Day Smoker    Packs/day: 0.50    Years: 35.00    Pack years: 17.50  . Smokeless tobacco: Never Used  Substance and Sexual Activity  . Alcohol use: No    Alcohol/week: 2.0 - 3.0 standard drinks    Types: 2 - 3 Cans of beer per week  . Drug use: No  . Sexual activity: Not on file  Lifestyle  . Physical activity:    Days per week: Not on file    Minutes per session: Not on file  . Stress: Not on file  Relationships  . Social connections:    Talks on phone: Not on file    Gets together: Not on file    Attends religious service: Not on file    Active member of club or organization: Not on file    Attends meetings of clubs or organizations: Not on file    Relationship status: Not on file  . Intimate partner violence:    Fear of current  or ex partner: Not on file    Emotionally abused: Not on file    Physically abused: Not on file    Forced sexual activity: Not on file  Other Topics Concern  . Not on file  Social History Narrative   Married, lives w/his wife and a son--minimal English   Works full time at [employer]   Current everyday smoker, with no plans to quit   Primary contact is daughter-in-law, Jaxen Samples (can interpret for him)    Family History  Problem Relation Age of Onset  . Cancer Mother 76       throid cancer   . Hyperlipidemia Mother   . Diabetes Father      Review of Systems  Constitutional: Negative.  Negative for chills and fever.  HENT: Negative.   Eyes: Negative.  Negative for blurred vision and double vision.  Respiratory: Negative for cough and shortness of breath.   Cardiovascular: Negative for chest pain  and palpitations.  Gastrointestinal: Negative for nausea and vomiting.  Genitourinary: Negative for dysuria and hematuria.  Skin: Negative.  Negative for rash.  Neurological: Positive for sensory change. Negative for dizziness and headaches.  Endo/Heme/Allergies: Negative.   All other systems reviewed and are negative.   Vitals:   03/30/18 1731  BP: (!) 154/76  Pulse: 63  Resp: 16  Temp: 98.1 F (36.7 C)  SpO2: 96%    Physical Exam  Constitutional: He is oriented to person, place, and time. He appears well-developed and well-nourished.  HENT:  Head: Normocephalic and atraumatic.  Nose: Nose normal.  Mouth/Throat: Oropharynx is clear and moist.  Eyes: Pupils are equal, round, and reactive to light. Conjunctivae and EOM are normal.  Neck: Normal range of motion. Neck supple.  Cardiovascular: Normal rate and regular rhythm.  Pulmonary/Chest: Effort normal and breath sounds normal.  Abdominal: Soft. There is no tenderness.  Musculoskeletal: Normal range of motion. He exhibits no edema.  Neurological: He is alert and oriented to person, place, and time.  Skin: Skin is warm and dry. Capillary refill takes less than 2 seconds.  Psychiatric: He has a normal mood and affect. His behavior is normal.  Vitals reviewed.   A total of 25 minutes was spent in the room with the patient, greater than 50% of which was in counseling/coordination of care regarding chronic medical conditions, treatment, diet, medications, and need for follow-up.   ASSESSMENT & PLAN: [name] was seen today for medication refill.  Diagnoses and all orders for this visit:  Dyslipidemia -     rosuvastatin (CRESTOR) 20 MG tablet; Take 1 tablet (20 mg total) by mouth daily.  Essential hypertension -     losartan-hydrochlorothiazide (HYZAAR) 100-25 MG tablet; Take 1 tablet by mouth daily. -     amLODipine (NORVASC) 5 MG tablet; Take 1 tablet (5 mg total) by mouth daily.  Prediabetes -     metFORMIN (GLUCOPHAGE)  500 MG tablet; Take 1 tablet (500 mg total) by mouth 2 (two) times daily with a meal.  Low vitamin D level    Patient Instructions  Cholesterol Cholesterol is a fat. Your body needs a small amount of cholesterol. Cholesterol (plaque) may build up in your blood vessels (arteries). That makes you more likely to have a heart attack or stroke. You cannot feel your cholesterol level. Having a blood test is the only way to find out if your level is high. Keep your test results. Work with your doctor to keep your cholesterol at a good  level. What do the results mean?  Total cholesterol is how much cholesterol is in your blood.  LDL is bad cholesterol. This is the type that can build up. Try to have low LDL.  HDL is good cholesterol. It cleans your blood vessels and carries LDL away. Try to have high HDL.  Triglycerides are fat that the body can store or burn for energy. What are good levels of cholesterol?  Total cholesterol below 200.  LDL below 100 is good for people who have health risks. LDL below 70 is good for people who have very high risks.  HDL above 40 is good. It is best to have HDL of 60 or higher.  Triglycerides below 150. How can I lower my cholesterol? Diet Follow your diet program as told by your doctor.  Choose fish, white meat chicken, or [place] that is roasted or baked. Try not to eat red meat, fried foods, sausage, or lunch meats.  Eat lots of fresh fruits and vegetables.  Choose whole grains, beans, pasta, potatoes, and cereals.  Choose olive oil, corn oil, or canola oil. Only use small amounts.  Try not to eat butter, mayonnaise, shortening, or palm kernel oils.  Try not to eat foods with trans fats.  Choose low-fat or nonfat dairy foods. ? Drink skim or nonfat milk. ? Eat low-fat or nonfat yogurt and cheeses. ? Try not to drink whole milk or cream. ? Try not to eat ice cream, egg yolks, or full-fat cheeses.  Healthy desserts include angel food cake,  ginger snaps, animal crackers, hard candy, popsicles, and low-fat or nonfat frozen yogurt. Try not to eat pastries, cakes, pies, and cookies.  Exercise Follow your exercise program as told by your doctor.  Be more active. Try gardening, walking, and taking the stairs.  Ask your doctor about ways that you can be more active.  Medicine  Take over-the-counter and prescription medicines only as told by your doctor. This information is not intended to replace advice given to you by your health care provider. Make sure you discuss any questions you have with your health care provider. Document Released: 10/07/2008 Document Revised: 02/10/2016 Document Reviewed: 01/21/2016 Elsevier Interactive Patient Education  2018 [employer].  Vitamin D Deficiency Vitamin D deficiency is when your body does not have enough vitamin D. Vitamin D is important because:  It helps your body use other minerals that your body needs.  It helps keep your bones strong and healthy.  It may help to prevent some diseases.  It helps your heart and other muscles work well.  You can get vitamin D by:  Eating foods with vitamin D in them.  Drinking or eating milk or other foods that have had vitamin D added to them.  Taking a vitamin D supplement.  Being in the sun.  Not getting enough vitamin D can make your bones become soft. It can also cause other health problems. Follow these instructions at home:  Take medicines and supplements only as told by your doctor.  Eat foods that have vitamin D. These include: ? Dairy products, cereals, or juices with added vitamin D. Check the label for vitamin D. ? Fatty fish like salmon or trout. ? Eggs. ? Oysters.  Do not use tanning beds.  Stay at a healthy weight. Lose weight, if needed.  Keep all follow-up visits as told by your doctor. This is important. Contact a doctor if:  Your symptoms do not go away.  You feel sick to your  stomach  (nauseous).  Youthrow up (vomit).  You poop less often than usual or you have trouble pooping (constipation). This information is not intended to replace advice given to you by your health care provider. Make sure you discuss any questions you have with your health care provider. Document Released: 06/30/2011 Document Revised: 12/17/2015 Document Reviewed: 11/26/2014 Elsevier Interactive Patient Education  2018 [employer]. Prediabetes Eating Plan Prediabetes-also called impaired glucose tolerance or impaired fasting glucose-is a condition that causes blood sugar (blood glucose) levels to be higher than normal. Following a healthy diet can help to keep prediabetes under control. It can also help to lower the risk of type 2 diabetes and heart disease, which are increased in people who have prediabetes. Along with regular exercise, a healthy diet:  Promotes weight loss.  Helps to control blood sugar levels.  Helps to improve the way that the body uses insulin.  What do I need to know about this eating plan?  Use the glycemic index (GI) to plan your meals. The index tells you how quickly a food will raise your blood sugar. Choose low-GI foods. These foods take a longer time to raise blood sugar.  Pay close attention to the amount of carbohydrates in the food that you eat. Carbohydrates increase blood sugar levels.  Keep track of how many calories you take in. Eating the right amount of calories will help you to achieve a healthy weight. Losing about 7 percent of your starting weight can help to prevent type 2 diabetes.  You may want to follow a Mediterranean diet. This diet includes a lot of vegetables, lean meats or fish, whole grains, fruits, and healthy oils and fats. What foods can I eat? Grains Whole grains, such as whole-wheat or whole-grain breads, crackers, cereals, and pasta. Unsweetened oatmeal. Bulgur. Barley. Quinoa. Brown rice. Corn or whole-wheat flour tortillas or taco  shells. Vegetables Lettuce. Spinach. Peas. Beets. Cauliflower. Cabbage. Broccoli. Carrots. Tomatoes. Squash. Eggplant. Herbs. Peppers. Onions. Cucumbers. Brussels sprouts. Fruits Berries. Bananas. Apples. Oranges. Grapes. Papaya. Mango. Pomegranate. Kiwi. Grapefruit. Cherries. Meats and Other Protein Sources Seafood. Lean meats, such as chicken and [place] or lean cuts of pork and beef. Tofu. Eggs. Nuts. Beans. Dairy Low-fat or fat-free dairy products, such as yogurt, cottage cheese, and cheese. Beverages Water. Tea. Coffee. Sugar-free or diet soda. Seltzer water. Milk. Milk alternatives, such as soy or almond milk. Condiments Mustard. Relish. Low-fat, low-sugar ketchup. Low-fat, low-sugar barbecue sauce. Low-fat or fat-free mayonnaise. Sweets and Desserts Sugar-free or low-fat pudding. Sugar-free or low-fat ice cream and other frozen treats. Fats and Oils Avocado. Walnuts. Olive oil. The items listed above may not be a complete list of recommended foods or beverages. Contact your dietitian for more options. What foods are not recommended? Grains Refined white flour and flour products, such as bread, pasta, snack foods, and cereals. Beverages Sweetened drinks, such as sweet iced tea and soda. Sweets and Desserts Baked goods, such as cake, cupcakes, pastries, cookies, and cheesecake. The items listed above may not be a complete list of foods and beverages to avoid. Contact your dietitian for more information. This information is not intended to replace advice given to you by your health care provider. Make sure you discuss any questions you have with your health care provider. Document Released: 11/25/2014 Document Revised: 12/17/2015 Document Reviewed: 08/06/2014 Elsevier Interactive Patient Education  2017 Else[employer] Inc.      Agustina Caroli, MD [facility]

## 2018-03-30 NOTE — Progress Notes (Signed)
Upon routing labs to pcp, noted that pt had already scheduled f/u with pcp for evening of 03/30/18.

## 2018-03-30 NOTE — Progress Notes (Signed)
Late entry: results reviewed with pt 03/29/18. Pt brings coworker with him to review to help translate. Spot glucose improved from previous 2 months ago, which was likely nonfasting. Lipids abnormal with total chol, trigs, LDL elevated. HDL low. A1c elevated prediabetic range, similar to 2 years ago. Diet and exercise recommendations discussed re: lipids, A1c. Less sugar, carbs, red meat, fried foods. Increase lean proteins, fruits, vegetables. Low fat cooking methods such as bake, broil, steam, grill. Increase sun exposure, shorts and tshirt/no shirt 49min/day. Vit D2 or D3 supplement 1000u qd po with meal. Increase to 2000u during winter or if no sun exposure.  Advised pt that his finger/hand pain could be r/t elevated blood sugars or low Vit D.  Also that his elevated blood sugars could lead to neuropathy and/or gout and explained sx of each.   Pt reports he has a pcp. RN advised f/u with pcp within next 1-2 months to discuss elevated A1c and lipids. Pt verbalizes understanding. Copy of labs provided to pt. Results routed to pcp. Pt denies further questions/concerns.

## 2018-07-06 ENCOUNTER — Other Ambulatory Visit: Payer: Self-pay | Admitting: Emergency Medicine

## 2018-07-06 ENCOUNTER — Telehealth: Payer: Self-pay | Admitting: Emergency Medicine

## 2018-07-06 MED ORDER — OLMESARTAN MEDOXOMIL-HCTZ 40-25 MG PO TABS: 1.0000 | ORAL_TABLET | Freq: Every day | ORAL | 1 refills | Status: DC

## 2018-07-06 NOTE — Telephone Encounter (Signed)
Call sent for PCP review- Hyzaar 100-25 mg is on back order and they would like to know if there is an alternative that can be call in for patient.

## 2018-07-06 NOTE — Telephone Encounter (Signed)
Copied from [place] [id number]. Topic: Quick Communication - See Telephone Encounter >> Jul 06, 2018  3:13 PM Bea Graff, NT wrote: CRM for notification. See Telephone encounter for: 07/06/18. Pts daughter states that the pharmacy stated the losartan-hydrochlorothiazide (HYZAAR) 100-25 MG tablet is on back order and to call PCP to see if another med can be called in. Please advise.

## 2018-07-06 NOTE — Telephone Encounter (Signed)
Benicar 40/25 sent.  Thanks.

## 2018-08-01 NOTE — Progress Notes (Signed)
David Jacobs   Telephone:(336) [phone] Fax:(336) [phone]   Clinic Follow up Note   Patient Care Team: Horald Pollen, MD as PCP - General (Internal Medicine) Leighton Ruff, MD as Consulting Physician (General Surgery)  Date of Service:  08/03/2018  CHIEF COMPLAINT: F/u of rectal cancer   SUMMARY OF ONCOLOGIC HISTORY: Oncology History   Rectal cancer [facility])   Staging form: Colon and Rectum, AJCC 7th Edition   - Clinical stage from 12/09/2015: Stage IIA (T3, N0, M0) - Signed by Truitt Merle, MD on 12/22/2015   - Pathologic stage from 03/25/2016: Stage I (yT2, N0, cM0) - Signed by Truitt Merle, MD on 04/22/2016       Rectal cancer ([place])   11/23/2015 Initial Diagnosis    Rectal cancer ([place])    11/23/2015 Procedure    Colonoscopy showed a large fungating ulcerated partially obstructing mass in the proximal rectum. This was biopsied.    11/23/2015 Initial Biopsy    Rectal mass biopsy showed adenocarcinoma    11/25/2015 Imaging    CT chest, abdomen and pelvis with contrast showed focal wall thickening involving the low rectum, no findings suspicious for metastatic disease.    12/09/2015 Procedure    EUS showed a T3 N0 lesion in proximal rectum.    12/22/2015 - 02/01/2016 Radiation Therapy    New adjuvant irradiation to the rectal cancer    12/22/2015 - 02/01/2016 Chemotherapy    Neoadjuvant Xeloda 2000mg  in AM, and 1500mg  in PM with concurrent radiation     03/25/2016 Surgery    Robotic-assisted anterior resection of proximal rectal cancer    03/25/2016 Pathology Results    Rectal sigmoid segmental resection showed adenocarcinoma with muscularis propria and aubmucosa invasion, margins were negative, extensive fibrosis, 12 lymph nodes were negative.    05/04/2016 - 07/23/2016 Chemotherapy    Adjuvant Xeloda 2000mg  bid, 2 weeks on and one week off      12/15/2016 Imaging    CT CAP IMPRESSION: 1. Surgical changes from a prior rectal colon resection and anastomosis. No CT  findings to suggest recurrent tumor or metastatic disease. 2. Presacral soft tissue thickening is most likely postsurgical change and post radiation change. Attention on future scans is suggested. 3. No significant findings involving the chest. No evidence of pulmonary metastatic disease. Stable three-vessel coronary artery calcifications.    01/24/2018 Imaging    IMPRESSION: 1. No evidence of local recurrence or metastatic disease. Presacral fibrotic changes have mildly improved. 2. Stable small right lower quadrant abdominal wall hernia containing a knuckle of small bowel, possibly at previous ileostomy. 3. Coronary and Aortic Atherosclerosis (ICD10-I70.0). Bilateral L5 pars defects.       CURRENT THERAPY:  Surveillance   INTERVAL HISTORY:  David Jacobs is here for a follow up of rectal cancer. He was last seen by me 1 year ago. In interim he was seen by NP Lacie 6  Months ago.  He presents to the clinic today with his translator. He notes he feel well. He notes no diarrhea but still has urgency. He goes to bathroom 3-4 times a day for BM. He denies blood in stool or pain. He would like his daughter-in-law to be called to review today's visit. He will see his PCP next month. He has not tried to quit smoking.      REVIEW OF SYSTEMS:   Constitutional: Denies fevers, chills or abnormal weight loss Eyes: Denies blurriness of vision Ears, nose, mouth, throat, and face: Denies mucositis or sore throat  Respiratory: Denies cough, dyspnea or wheezes Cardiovascular: Denies palpitation, chest discomfort or lower extremity swelling Gastrointestinal:  Denies nausea, heartburn or change in bowel habits (+) intermittent frequent BM  Skin: Denies abnormal skin rashes Lymphatics: Denies new lymphadenopathy or easy bruising Neurological:Denies numbness, tingling or new weaknesses Behavioral/Psych: Mood is stable, no new changes  All other systems were reviewed with the patient and are  negative.  MEDICAL HISTORY:  Past Medical History:  Diagnosis Date  . Hypertension     SURGICAL HISTORY: Past Surgical History:  Procedure Laterality Date  . EUS N/A 12/09/2015   Procedure: LOWER ENDOSCOPIC ULTRASOUND (EUS);  Surgeon: [name], MD;  Location: [facility];  Service: Endoscopy;  Laterality: N/A;  . XI ROBOTIC ASSISTED LOWER ANTERIOR RESECTION N/A 03/25/2016   Procedure: XI ROBOTIC ASSISTED LOWER ANTERIOR RESECTION;  Surgeon: Leighton Ruff, MD;  Location: [facility];  Service: General;  Laterality: N/A;    I have reviewed the social history and family history with the patient and they are unchanged from previous note.  ALLERGIES:  has No Known Allergies.  MEDICATIONS:  Current Outpatient Medications  Medication Sig Dispense Refill  . amLODipine (NORVASC) 5 MG tablet Take 1 tablet (5 mg total) by mouth daily. 90 tablet 3  . losartan-hydrochlorothiazide (HYZAAR) 100-25 MG tablet Take 1 tablet by mouth daily. 90 tablet 3  . metFORMIN (GLUCOPHAGE) 500 MG tablet Take 1 tablet (500 mg total) by mouth 2 (two) times daily with a meal. 180 tablet 3  . olmesartan-hydrochlorothiazide (BENICAR HCT) 40-25 MG tablet Take 1 tablet by mouth daily. 90 tablet 1  . rosuvastatin (CRESTOR) 20 MG tablet Take 1 tablet (20 mg total) by mouth daily. 90 tablet 3   No current facility-administered medications for this visit.     PHYSICAL EXAMINATION: ECOG PERFORMANCE STATUS: 0 - Asymptomatic  Vitals:   08/03/18 1300  BP: (!) 151/88  Pulse: 73  Resp: 18  Temp: 97.9 F (36.6 C)  SpO2: 99%   Filed Weights   08/03/18 1300  Weight: 209 lb 11.2 oz (95.1 kg)    GENERAL:alert, no distress and comfortable SKIN: skin color, texture, turgor are normal, no rashes or significant lesions (+) Known circular raised lesions on b/l lower legs  EYES: normal, Conjunctiva are pink and non-injected, sclera clear OROPHARYNX:no exudate, no erythema and lips, buccal mucosa, and tongue normal  NECK:  supple, thyroid normal size, non-tender, without nodularity LYMPH:  no palpable lymphadenopathy in the cervical, axillary or inguinal LUNGS: clear to auscultation and percussion with normal breathing effort HEART: regular rate & rhythm and no murmurs and no lower extremity edema ABDOMEN:abdomen soft, non-tender and normal bowel sounds. Patient declined rectal exam today  Musculoskeletal:no cyanosis of digits and no clubbing  NEURO: alert & oriented x 3 with fluent speech, no focal motor/sensory deficits    LABORATORY DATA:  I have reviewed the data as listed CBC Latest Ref Rng & Units 08/03/2018 03/27/2018 01/23/2018  WBC 4.0 - 10.5 K/uL 9.6 9.2 8.3  Hemoglobin 13.0 - 17.0 g/dL 14.9 16.0 15.7  Hematocrit 39.0 - 52.0 % 43.3 46.5 44.9  Platelets 150 - 400 K/uL 267 321 286     CMP Latest Ref Rng & Units 08/03/2018 03/27/2018 01/23/2018  Glucose 70 - 99 mg/dL 114(H) 101(H) 137(H)  BUN 8 - 23 mg/dL 17 21 18   Creatinine 0.61 - 1.24 mg/dL 1.04 1.12 1.11  Sodium 135 - 145 mmol/L 140 143 138  Potassium 3.5 - 5.1 mmol/L 4.2 4.4 4.0  Chloride 98 -  111 mmol/L 106 105 104  CO2 22 - 32 mmol/L 26 - 26  Calcium 8.9 - 10.3 mg/dL 9.5 9.8 10.1  Total Protein 6.5 - 8.1 g/dL 7.2 7.3 7.3  Total Bilirubin 0.3 - 1.2 mg/dL 0.4 0.4 0.4  Alkaline Phos 38 - 126 U/L 60 63 65  AST 15 - 41 U/L 15 13 14(L)  ALT 0 - 44 U/L 23 17 19       RADIOGRAPHIC STUDIES: I have personally reviewed the radiological images as listed and agreed with the findings in the report. No results found.   ASSESSMENT & PLAN:  David Jacobs is a 68 y.o. male with   1. Proximal rectal adenocarcinoma, uT3N0M0, stage IIA, ypT2N0M0 -He was diagnosed in 11/2015. He is s/p neoadjuvant concurrent chemoradiation with Xeloda, surgical resection and adjuvant Xeloda.  -He has no residual effects from treatment. He only notes 3-4 bowel movements daily, no diarrhea.  -He is clinically doing well. Labs reviewed, CBC and CMP are WNL except glucose at 114,  CEA is still pending. He declined rectal exam today. There is no clinical concern for recurrence.  -He is almost 3 years since diagnosis. Will continue with colon cancer surveillance with regular labs and exams for a total of 5 years. Will repeat 1 more CT scan in 01/2019  -F/u in 6 months    2. HTN -Continue Amlodipine, Hyzaar and Benicar.  -Continue to follow up with primary care physician -BP at 151/88 today (08/03/2018) will recheck in clinic today    3. Smoking and alcohol cessation  -I also encouraged him to drink alcohol less and strongly encouraged him to cut back and quit smoking.  -He is still not interested in smoking cessation although he is aware of the smoking risks.    Plan  F/u in 6 months Lab and CT CAP with contrast one week before    No problem-specific Assessment & Plan notes found for this encounter.   Orders Placed This Encounter  Procedures  . CT Abdomen Pelvis W Contrast    Standing Status:   Future    Standing Expiration Date:   08/03/2019    Order Specific Question:   If indicated for the ordered procedure, I authorize the administration of contrast media per Radiology protocol    Answer:   Yes    Order Specific Question:   Preferred imaging location?    Answer:   [facility]    Order Specific Question:   Is Oral Contrast requested for this exam?    Answer:   Yes, Per Radiology protocol    Order Specific Question:   Radiology Contrast Protocol - do NOT remove file path    Answer:   \\[email or web address]  . CT Chest W Contrast    Standing Status:   Future    Standing Expiration Date:   08/03/2019    Order Specific Question:   If indicated for the ordered procedure, I authorize the administration of contrast media per Radiology protocol    Answer:   Yes    Order Specific Question:   Preferred imaging location?    Answer:   [facility]    Order Specific Question:   Radiology Contrast Protocol - do NOT remove  file path    Answer:   \\[email or web address]   All questions were answered. The patient knows to call the clinic with any problems, questions or concerns. No barriers to learning was detected. I spent 15 minutes counseling the patient face to face. The  total time spent in the appointment was 20 minutes and more than 50% was on counseling and review of test results     Truitt Merle, MD 08/03/2018   I, Joslyn Devon, am acting as scribe for Truitt Merle, MD.   I have reviewed the above documentation for accuracy and completeness, and I agree with the above.

## 2018-08-03 ENCOUNTER — Encounter: Payer: Self-pay | Admitting: Hematology

## 2018-08-03 ENCOUNTER — Inpatient Hospital Stay (HOSPITAL_BASED_OUTPATIENT_CLINIC_OR_DEPARTMENT_OTHER): Payer: PRIVATE HEALTH INSURANCE | Admitting: Hematology

## 2018-08-03 ENCOUNTER — Telehealth: Payer: Self-pay

## 2018-08-03 ENCOUNTER — Inpatient Hospital Stay: Payer: PRIVATE HEALTH INSURANCE | Attending: Hematology

## 2018-08-03 VITALS — BP 151/88 | HR 73 | Temp 97.9°F | Resp 18 | Ht 71.0 in | Wt 209.7 lb

## 2018-08-03 DIAGNOSIS — I10 Essential (primary) hypertension: Secondary | ICD-10-CM | POA: Diagnosis not present

## 2018-08-03 DIAGNOSIS — Z7984 Long term (current) use of oral hypoglycemic drugs: Secondary | ICD-10-CM

## 2018-08-03 DIAGNOSIS — I700 Atherosclerosis of aorta: Secondary | ICD-10-CM | POA: Diagnosis not present

## 2018-08-03 DIAGNOSIS — C20 Malignant neoplasm of rectum: Secondary | ICD-10-CM | POA: Diagnosis not present

## 2018-08-03 DIAGNOSIS — Z9221 Personal history of antineoplastic chemotherapy: Secondary | ICD-10-CM | POA: Diagnosis not present

## 2018-08-03 DIAGNOSIS — F17210 Nicotine dependence, cigarettes, uncomplicated: Secondary | ICD-10-CM | POA: Diagnosis not present

## 2018-08-03 DIAGNOSIS — Z79899 Other long term (current) drug therapy: Secondary | ICD-10-CM | POA: Insufficient documentation

## 2018-08-03 DIAGNOSIS — Z923 Personal history of irradiation: Secondary | ICD-10-CM | POA: Diagnosis not present

## 2018-08-03 LAB — COMPREHENSIVE METABOLIC PANEL
ALBUMIN: 4.4 g/dL (ref 3.5–5.0)
ALT: 23 U/L (ref 0–44)
ANION GAP: 8 (ref 5–15)
AST: 15 U/L (ref 15–41)
Alkaline Phosphatase: 60 U/L (ref 38–126)
BILIRUBIN TOTAL: 0.4 mg/dL (ref 0.3–1.2)
BUN: 17 mg/dL (ref 8–23)
CALCIUM: 9.5 mg/dL (ref 8.9–10.3)
CO2: 26 mmol/L (ref 22–32)
CREATININE: 1.04 mg/dL (ref 0.61–1.24)
Chloride: 106 mmol/L (ref 98–111)
GFR calc Af Amer: >60 mL/min (ref >60)
GFR calc non Af Amer: >60 mL/min (ref >60)
GLUCOSE: 114 mg/dL — AB (ref 70–99)
Potassium: 4.2 mmol/L (ref 3.5–5.1)
SODIUM: 140 mmol/L (ref 135–145)
TOTAL PROTEIN: 7.2 g/dL (ref 6.5–8.1)

## 2018-08-03 LAB — CBC WITH DIFFERENTIAL/PLATELET
ABS IMMATURE GRANULOCYTES: 0.03 10*3/uL (ref 0.00–0.07)
BASOS ABS: 0 10*3/uL (ref 0.0–0.1)
Basophils Relative: 0 %
EOS PCT: 2 %
Eosinophils Absolute: 0.2 10*3/uL (ref 0.0–0.5)
HEMATOCRIT: 43.3 % (ref 39.0–52.0)
Hemoglobin: 14.9 g/dL (ref 13.0–17.0)
IMMATURE GRANULOCYTES: 0 %
LYMPHS ABS: 2.2 10*3/uL (ref 0.7–4.0)
LYMPHS PCT: 23 %
MCH: 31.8 pg (ref 26.0–34.0)
MCHC: 34.4 g/dL (ref 30.0–36.0)
MCV: 92.3 fL (ref 80.0–100.0)
Monocytes Absolute: 0.5 10*3/uL (ref 0.1–1.0)
Monocytes Relative: 5 %
NEUTROS ABS: 6.7 10*3/uL (ref 1.7–7.7)
NRBC: 0 % (ref 0.0–0.2)
Neutrophils Relative %: 70 %
Platelets: 267 10*3/uL (ref 150–400)
RBC: 4.69 MIL/uL (ref 4.22–5.81)
RDW: 12.8 % (ref 11.5–15.5)
WBC: 9.6 10*3/uL (ref 4.0–10.5)

## 2018-08-03 LAB — CEA (IN HOUSE-CHCC): CEA (CHCC-In House): 2.49 ng/mL (ref 0.00–5.00)

## 2018-08-03 NOTE — Telephone Encounter (Signed)
Printed avs and calender of upcoming appointment. Per 1/10 los 

## 2018-08-07 ENCOUNTER — Telehealth: Payer: Self-pay

## 2018-08-07 NOTE — Telephone Encounter (Signed)
-----   Message from Truitt Merle, MD sent at 08/05/2018  7:58 PM EST ----- Please let pt's daughter-in-law know his lab were WNL, no concerns, thanks   Truitt Merle  08/05/2018

## 2018-08-07 NOTE — Telephone Encounter (Signed)
Left voice message for patient's daughter Nickolas Madrid that patient's labs were WNL, no concerns.

## 2018-09-07 ENCOUNTER — Ambulatory Visit: Payer: Self-pay | Admitting: *Deleted

## 2018-09-07 VITALS — BP 136/78 | HR 67 | Ht 70.0 in | Wt 205.0 lb

## 2018-09-07 DIAGNOSIS — E785 Hyperlipidemia, unspecified: Secondary | ICD-10-CM

## 2018-09-07 DIAGNOSIS — R7989 Other specified abnormal findings of blood chemistry: Secondary | ICD-10-CM

## 2018-09-07 DIAGNOSIS — Z0000 Encounter for general adult medical examination without abnormal findings: Secondary | ICD-10-CM

## 2018-09-07 DIAGNOSIS — I10 Essential (primary) hypertension: Secondary | ICD-10-CM

## 2018-09-07 DIAGNOSIS — R7303 Prediabetes: Secondary | ICD-10-CM

## 2018-09-07 NOTE — Progress Notes (Signed)
[employer] insurance premium discount evaluation: Labs Drawn. Replacements ROI form signed. Tobacco Free Attestation form signed.  Forms placed in paper chart. Okay to route results to pcp Sagardia.

## 2018-09-08 LAB — CMP12+LP+TP+TSH+6AC+PSA+CBC…
ALBUMIN: 4.4 g/dL (ref 3.8–4.8)
ALK PHOS: 59 IU/L (ref 39–117)
ALT: 20 IU/L (ref 0–44)
AST: 17 IU/L (ref 0–40)
Albumin/Globulin Ratio: 1.9 (ref 1.2–2.2)
BASOS: 1 %
BUN/Creatinine Ratio: 14 (ref 10–24)
BUN: 14 mg/dL (ref 8–27)
Basophils Absolute: 0.1 10*3/uL (ref 0.0–0.2)
Bilirubin Total: 0.4 mg/dL (ref 0.0–1.2)
CALCIUM: 9.3 mg/dL (ref 8.6–10.2)
CHLORIDE: 102 mmol/L (ref 96–106)
CHOLESTEROL TOTAL: 87 mg/dL — AB (ref 100–199)
Chol/HDL Ratio: 2.8 ratio (ref 0.0–5.0)
Creatinine, Ser: 1.02 mg/dL (ref 0.76–1.27)
EOS (ABSOLUTE): 0.3 10*3/uL (ref 0.0–0.4)
EOS: 5 %
FREE THYROXINE INDEX: 2.1 (ref 1.2–4.9)
GFR calc Af Amer: 87 mL/min/{1.73_m2} (ref >59)
GFR calc non Af Amer: 75 mL/min/{1.73_m2} (ref >59)
GGT: 23 IU/L (ref 0–65)
Globulin, Total: 2.3 g/dL (ref 1.5–4.5)
Glucose: 102 mg/dL — ABNORMAL HIGH (ref 65–99)
HDL: 31 mg/dL — ABNORMAL LOW (ref >39)
HEMATOCRIT: 42.8 % (ref 37.5–51.0)
HEMOGLOBIN: 14.9 g/dL (ref 13.0–17.7)
Immature Grans (Abs): 0 10*3/uL (ref 0.0–0.1)
Immature Granulocytes: 0 %
Iron: 93 ug/dL (ref 38–169)
LDH: 146 IU/L (ref 121–224)
LDL CALC: 33 mg/dL (ref 0–99)
LYMPHS ABS: 1.9 10*3/uL (ref 0.7–3.1)
Lymphs: 28 %
MCH: 32.4 pg (ref 26.6–33.0)
MCHC: 34.8 g/dL (ref 31.5–35.7)
MCV: 93 fL (ref 79–97)
Monocytes Absolute: 0.6 10*3/uL (ref 0.1–0.9)
Monocytes: 9 %
NEUTROS ABS: 3.8 10*3/uL (ref 1.4–7.0)
Neutrophils: 57 %
PHOSPHORUS: 3.3 mg/dL (ref 2.8–4.1)
PLATELETS: 242 10*3/uL (ref 150–450)
POTASSIUM: 4.5 mmol/L (ref 3.5–5.2)
Prostate Specific Ag, Serum: 0.5 ng/mL (ref 0.0–4.0)
RBC: 4.6 x10E6/uL (ref 4.14–5.80)
RDW: 13.1 % (ref 11.6–15.4)
SODIUM: 141 mmol/L (ref 134–144)
T3 Uptake Ratio: 29 % (ref 24–39)
T4, Total: 7.3 ug/dL (ref 4.5–12.0)
TOTAL PROTEIN: 6.7 g/dL (ref 6.0–8.5)
TSH: 4.22 u[IU]/mL (ref 0.450–4.500)
Triglycerides: 113 mg/dL (ref 0–149)
Uric Acid: 6 mg/dL (ref 3.7–8.6)
VLDL Cholesterol Cal: 23 mg/dL (ref 5–40)
WBC: 6.8 10*3/uL (ref 3.4–10.8)

## 2018-09-08 LAB — VITAMIN D 25 HYDROXY (VIT D DEFICIENCY, FRACTURES): VIT D 25 HYDROXY: 23.2 ng/mL — AB (ref 30.0–100.0)

## 2018-09-08 LAB — HGB A1C W/O EAG: Hgb A1c MFr Bld: 6.5 % — ABNORMAL HIGH (ref 4.8–5.6)

## 2018-09-11 NOTE — Progress Notes (Signed)
Noted PCM visit due Mar 2020 for follow up Rx/labs

## 2018-12-25 ENCOUNTER — Other Ambulatory Visit: Payer: Self-pay | Admitting: Emergency Medicine

## 2018-12-25 NOTE — Telephone Encounter (Signed)
Requested medication (s) are due for refill today: {yes  Requested medication (s) are on the active medication list:yes  Last refill: 07/06/18  Future visit scheduled: No  Notes to clinic:  Pt needs office visit    Requested Prescriptions  Pending Prescriptions Disp Refills   olmesartan-hydrochlorothiazide (BENICAR HCT) 40-25 MG tablet [Pharmacy Med Name: OLMESARTAN MEDOX/HCTZ 40-25MG  TAB] 90 tablet 1    Sig: Take 1 tablet by mouth daily.     Cardiovascular: ARB + Diuretic Combos Failed - 12/25/2018  2:34 PM      Failed - Valid encounter within last 6 months    Recent Outpatient Visits          9 months ago Dyslipidemia   Primary Care at [facility], Ines Bloomer, MD   1 year ago Essential hypertension   Primary Care at [place], Ines Bloomer, MD   2 years ago Rectal cancer [facility])   Primary Care at [facility], Carroll Sage, [place]   3 years ago Rectal bleeding   Primary Care at [facility], [facility]             Passed - K in normal range and within 180 days    Potassium  Date Value Ref Range Status  09/07/2018 4.5 3.5 - 5.2 mmol/L Final  04/24/2017 4.0 3.5 - 5.1 mEq/L Final         Passed - Na in normal range and within 180 days    Sodium  Date Value Ref Range Status  09/07/2018 141 134 - 144 mmol/L Final  04/24/2017 138 136 - 145 mEq/L Final         Passed - Cr in normal range and within 180 days    Creatinine  Date Value Ref Range Status  04/24/2017 1.0 0.7 - 1.3 mg/dL Final   Creatinine, Ser  Date Value Ref Range Status  09/07/2018 1.02 0.76 - 1.27 mg/dL Final         Passed - Ca in normal range and within 180 days    Calcium  Date Value Ref Range Status  09/07/2018 9.3 8.6 - 10.2 mg/dL Final  04/24/2017 9.8 8.4 - 10.4 mg/dL Final         Passed - Patient is not pregnant      Passed - Last BP in normal range    BP Readings from Last 1 Encounters:  09/07/18 136/78

## 2019-01-24 NOTE — Progress Notes (Signed)
David Jacobs   [phone] Fax:(336) [phone]   Clinic Follow up Note   Patient Care Team: Horald Pollen, MD as PCP - General (Internal Medicine) Leighton Ruff, MD as Consulting Physician (General Surgery)  Date of Service:  02/01/2019  CHIEF COMPLAINT: F/u of rectal cancer  SUMMARY OF ONCOLOGIC HISTORY: Oncology History Overview Note  Rectal cancer [facility])   Staging form: Colon and Rectum, AJCC 7th Edition   - Clinical stage from 12/09/2015: Stage IIA (T3, N0, M0) - Signed by Truitt Merle, MD on 12/22/2015   - Pathologic stage from 03/25/2016: Stage I (yT2, N0, cM0) - Signed by Truitt Merle, MD on 04/22/2016     Rectal cancer ([place])  11/23/2015 Initial Diagnosis   Rectal cancer ([place])   11/23/2015 Procedure   Colonoscopy showed a large fungating ulcerated partially obstructing mass in the proximal rectum. This was biopsied.   11/23/2015 Initial Biopsy   Rectal mass biopsy showed adenocarcinoma   11/25/2015 Imaging   CT chest, abdomen and pelvis with contrast showed focal wall thickening involving the low rectum, no findings suspicious for metastatic disease.   12/09/2015 Procedure   EUS showed a T3 N0 lesion in proximal rectum.   12/22/2015 - 02/01/2016 Radiation Therapy   New adjuvant irradiation to the rectal cancer   12/22/2015 - 02/01/2016 Chemotherapy   Neoadjuvant Xeloda 2000mg  in AM, and 1500mg  in PM with concurrent radiation    03/25/2016 Surgery   Robotic-assisted anterior resection of proximal rectal cancer   03/25/2016 Pathology Results   Rectal sigmoid segmental resection showed adenocarcinoma with muscularis propria and aubmucosa invasion, margins were negative, extensive fibrosis, 12 lymph nodes were negative.   05/04/2016 - 07/23/2016 Chemotherapy   Adjuvant Xeloda 2000mg  bid, 2 weeks on and one week off     12/15/2016 Imaging   CT CAP IMPRESSION: 1. Surgical changes from a prior rectal colon resection and anastomosis. No CT findings to  suggest recurrent tumor or metastatic disease. 2. Presacral soft tissue thickening is most likely postsurgical change and post radiation change. Attention on future scans is suggested. 3. No significant findings involving the chest. No evidence of pulmonary metastatic disease. Stable three-vessel coronary artery calcifications.   01/24/2018 Imaging   IMPRESSION: 1. No evidence of local recurrence or metastatic disease. Presacral fibrotic changes have mildly improved. 2. Stable small right lower quadrant abdominal wall hernia containing a knuckle of small bowel, possibly at previous ileostomy. 3. Coronary and Aortic Atherosclerosis (ICD10-I70.0). Bilateral L5 pars defects.    01/30/2019 Imaging   CT CAP W Contrast 01/30/19  IMPRESSION: 1. No evidence of local tumor recurrence or metastatic disease. 2. Aortic Atherosclerosis (ICD10-I70.0). Coronary artery calcifications. 3. Lumbar spondylosis with bilateral L5 pars defects and anterolisthesis of L5 on S1.        CURRENT THERAPY:  Surveillance   INTERVAL HISTORY:  David Jacobs is here for a follow up rectal cancer. He was last seen by me 7 months ago. He presents to the clinic with his language interpretor. He notes he is doing well. He denies any new changes. He notes his BMs are normal. He is eating adequately. He denies any concerns and he is working as normal.  He sees he sees his PCP yearly, last time in 2019. He will see him in 2 months.   REVIEW OF SYSTEMS:   Constitutional: Denies fevers, chills or abnormal weight loss Eyes: Denies blurriness of vision Ears, nose, mouth, throat, and face: Denies mucositis or sore throat Respiratory: Denies cough,  dyspnea or wheezes Cardiovascular: Denies palpitation, chest discomfort or lower extremity swelling Gastrointestinal:  Denies nausea, heartburn or change in bowel habits Skin: Denies abnormal skin rashes Lymphatics: Denies new lymphadenopathy or easy bruising Neurological:Denies  numbness, tingling or new weaknesses Behavioral/Psych: Mood is stable, no new changes  All other systems were reviewed with the patient and are negative.  MEDICAL HISTORY:  Past Medical History:  Diagnosis Date  . Hypertension     SURGICAL HISTORY: Past Surgical History:  Procedure Laterality Date  . EUS N/A 12/09/2015   Procedure: LOWER ENDOSCOPIC ULTRASOUND (EUS);  Surgeon: [name], MD;  Location: [facility];  Service: Endoscopy;  Laterality: N/A;  . XI ROBOTIC ASSISTED LOWER ANTERIOR RESECTION N/A 03/25/2016   Procedure: XI ROBOTIC ASSISTED LOWER ANTERIOR RESECTION;  Surgeon: Leighton Ruff, MD;  Location: [facility];  Service: General;  Laterality: N/A;    I have reviewed the social history and family history with the patient and they are unchanged from previous note.  ALLERGIES:  has No Known Allergies.  MEDICATIONS:  Current Outpatient Medications  Medication Sig Dispense Refill  . hydrochlorothiazide (HYDRODIURIL) 25 MG tablet Take 25 mg by mouth daily.     No current facility-administered medications for this visit.     PHYSICAL EXAMINATION: ECOG PERFORMANCE STATUS: 0  Vitals:   02/01/19 1312  BP: (!) 143/68  Pulse: 87  Resp: 20  Temp: 98.2 F (36.8 C)   Filed Weights   02/01/19 1312  Weight: 200 lb 14.4 oz (91.1 kg)    GENERAL:alert, no distress and comfortable SKIN: skin color, texture, turgor are normal, no rashes or significant lesions NEURO: alert & oriented x 3 with fluent speech, no focal motor/sensory deficits Rectal exam not performed today, pt declined    LABORATORY DATA:  I have reviewed the data as listed CBC Latest Ref Rng & Units 01/30/2019 09/07/2018 08/03/2018  WBC 4.0 - 10.5 K/uL 8.8 6.8 9.6  Hemoglobin 13.0 - 17.0 g/dL 16.2 14.9 14.9  Hematocrit 39.0 - 52.0 % 47.6 42.8 43.3  Platelets 150 - 400 K/uL 321 242 267     CMP Latest Ref Rng & Units 01/30/2019 09/07/2018 08/03/2018  Glucose 70 - 99 mg/dL 116(H) 102(H) 114(H)  BUN 8 - 23 mg/dL  16 14 17   Creatinine 0.61 - 1.24 mg/dL 1.19 1.02 1.04  Sodium 135 - 145 mmol/L 138 141 140  Potassium 3.5 - 5.1 mmol/L 3.9 4.5 4.2  Chloride 98 - 111 mmol/L 102 102 106  CO2 22 - 32 mmol/L 27 - 26  Calcium 8.9 - 10.3 mg/dL 9.6 9.3 9.5  Total Protein 6.5 - 8.1 g/dL 7.3 6.7 7.2  Total Bilirubin 0.3 - 1.2 mg/dL 0.5 0.4 0.4  Alkaline Phos 38 - 126 U/L 60 59 60  AST 15 - 41 U/L 14(L) 17 15  ALT 0 - 44 U/L 15 20 23       RADIOGRAPHIC STUDIES: I have personally reviewed the radiological images as listed and agreed with the findings in the report. No results found.   ASSESSMENT & PLAN:  David Jacobs is a 68 y.o. male with   1. Proximal rectal adenocarcinoma, uT3N0M0, stage IIA, ypT2N0M0 -He was diagnosed in 11/2015. He is s/p neoadjuvant concurrent chemoradiation with Xeloda, surgical resection and adjuvant Xeloda.  -We discussed his CT CAP from 01/30/19 which shows no evidence of disease.  -Scan does show atherosclerosis of aorta and Coronary artery calcifications. I recommend  He follow up with his PCP to determine if he needs cardiac  workup.  -He is clinically doing well. Labs reviewed, CBC, CMP and CEA WNL except BG 116, AST 14. Rectal exam not performed today. There is no clinical concern for recurrence.  -He is 3 years since diagnosis. His risk of recurrence has decreased significantly. Will continue with cancer surveillance with regular labs and exams for a total of 5 years. Do not plan to repeat another scan. He is due for next colonoscopy in 2021.  -F/u in 1 year. He will see his PCP in interim.    2. HTN -Continue Amlodipine, Hyzaar and Benicar.  -Continue to follow up with primary care physician -BP at 143/68 today (02/01/2019) will recheck in clinic today    3. Smoking and alcohol cessation  -I also encouraged him to drink alcohol less and strongly encouraged him to cut back and quit smoking.  -He is still not interested in smoking cessation although he is aware of the  smoking risks.    Plan -lab and CT CAP reviewed, NED  -F/u with PCP about atherosclerosis -Lab and f/u in 1 year -Colonoscopy in 2021   No problem-specific Assessment & Plan notes found for this encounter.   No orders of the defined types were placed in this encounter.  All questions were answered. The patient knows to call the clinic with any problems, questions or concerns. No barriers to learning was detected. I spent 15 minutes counseling the patient face to face. The total time spent in the appointment was 20 minutes and more than 50% was on counseling and review of test results     Truitt Merle, MD 02/01/2019   I, Joslyn Devon, am acting as scribe for Truitt Merle, MD.   I have reviewed the above documentation for accuracy and completeness, and I agree with the above.

## 2019-01-30 ENCOUNTER — Other Ambulatory Visit: Payer: Self-pay

## 2019-01-30 ENCOUNTER — Encounter (HOSPITAL_COMMUNITY): Payer: Self-pay

## 2019-01-30 ENCOUNTER — Ambulatory Visit (HOSPITAL_COMMUNITY)
Admission: RE | Admit: 2019-01-30 | Discharge: 2019-01-30 | Disposition: A | Discharge status: Home / Self Care | Payer: No Typology Code available for payment source | Source: Ambulatory Visit | Attending: Hematology | Admitting: Hematology

## 2019-01-30 ENCOUNTER — Inpatient Hospital Stay: Payer: PRIVATE HEALTH INSURANCE | Attending: Hematology

## 2019-01-30 DIAGNOSIS — F17210 Nicotine dependence, cigarettes, uncomplicated: Secondary | ICD-10-CM | POA: Insufficient documentation

## 2019-01-30 DIAGNOSIS — C20 Malignant neoplasm of rectum: Secondary | ICD-10-CM | POA: Insufficient documentation

## 2019-01-30 DIAGNOSIS — Z79899 Other long term (current) drug therapy: Secondary | ICD-10-CM | POA: Insufficient documentation

## 2019-01-30 DIAGNOSIS — Z923 Personal history of irradiation: Secondary | ICD-10-CM | POA: Insufficient documentation

## 2019-01-30 DIAGNOSIS — I2510 Atherosclerotic heart disease of native coronary artery without angina pectoris: Secondary | ICD-10-CM | POA: Insufficient documentation

## 2019-01-30 DIAGNOSIS — M47816 Spondylosis without myelopathy or radiculopathy, lumbar region: Secondary | ICD-10-CM | POA: Insufficient documentation

## 2019-01-30 DIAGNOSIS — I700 Atherosclerosis of aorta: Secondary | ICD-10-CM | POA: Insufficient documentation

## 2019-01-30 DIAGNOSIS — I10 Essential (primary) hypertension: Secondary | ICD-10-CM | POA: Insufficient documentation

## 2019-01-30 DIAGNOSIS — Z9221 Personal history of antineoplastic chemotherapy: Secondary | ICD-10-CM | POA: Insufficient documentation

## 2019-01-30 LAB — CBC WITH DIFFERENTIAL/PLATELET
Abs Immature Granulocytes: 0.04 10*3/uL (ref 0.00–0.07)
Basophils Absolute: 0.1 10*3/uL (ref 0.0–0.1)
Basophils Relative: 1 %
Eosinophils Absolute: 0.3 10*3/uL (ref 0.0–0.5)
Eosinophils Relative: 3 %
HCT: 47.6 % (ref 39.0–52.0)
Hemoglobin: 16.2 g/dL (ref 13.0–17.0)
Immature Granulocytes: 1 %
Lymphocytes Relative: 31 %
Lymphs Abs: 2.7 10*3/uL (ref 0.7–4.0)
MCH: 31.6 pg (ref 26.0–34.0)
MCHC: 34 g/dL (ref 30.0–36.0)
MCV: 92.8 fL (ref 80.0–100.0)
Monocytes Absolute: 0.5 10*3/uL (ref 0.1–1.0)
Monocytes Relative: 6 %
Neutro Abs: 5.3 10*3/uL (ref 1.7–7.7)
Neutrophils Relative %: 58 %
Platelets: 321 10*3/uL (ref 150–400)
RBC: 5.13 MIL/uL (ref 4.22–5.81)
RDW: 13.1 % (ref 11.5–15.5)
WBC: 8.8 10*3/uL (ref 4.0–10.5)
nRBC: 0 % (ref 0.0–0.2)

## 2019-01-30 LAB — COMPREHENSIVE METABOLIC PANEL
ALT: 15 U/L (ref 0–44)
AST: 14 U/L — ABNORMAL LOW (ref 15–41)
Albumin: 4.3 g/dL (ref 3.5–5.0)
Alkaline Phosphatase: 60 U/L (ref 38–126)
Anion gap: 9 (ref 5–15)
BUN: 16 mg/dL (ref 8–23)
CO2: 27 mmol/L (ref 22–32)
Calcium: 9.6 mg/dL (ref 8.9–10.3)
Chloride: 102 mmol/L (ref 98–111)
Creatinine, Ser: 1.19 mg/dL (ref 0.61–1.24)
GFR calc Af Amer: >60 mL/min (ref >60)
GFR calc non Af Amer: >60 mL/min (ref >60)
Glucose, Bld: 116 mg/dL — ABNORMAL HIGH (ref 70–99)
Potassium: 3.9 mmol/L (ref 3.5–5.1)
Sodium: 138 mmol/L (ref 135–145)
Total Bilirubin: 0.5 mg/dL (ref 0.3–1.2)
Total Protein: 7.3 g/dL (ref 6.5–8.1)

## 2019-01-30 LAB — CEA (IN HOUSE-CHCC): CEA (CHCC-In House): 3.31 ng/mL (ref 0.00–5.00)

## 2019-01-30 MED ORDER — IOHEXOL 300 MG/ML  SOLN
100.0000 mL | Freq: Once | INTRAMUSCULAR | Status: AC | PRN
  Administered 2019-01-30: 100 mL via INTRAVENOUS

## 2019-01-30 MED ORDER — SODIUM CHLORIDE (PF) 0.9 % IJ SOLN
INTRAMUSCULAR | Status: AC
  Filled 2019-01-30: qty 50

## 2019-02-01 ENCOUNTER — Telehealth: Payer: Self-pay | Admitting: Hematology

## 2019-02-01 ENCOUNTER — Inpatient Hospital Stay (HOSPITAL_BASED_OUTPATIENT_CLINIC_OR_DEPARTMENT_OTHER): Payer: PRIVATE HEALTH INSURANCE | Admitting: Hematology

## 2019-02-01 ENCOUNTER — Encounter: Payer: Self-pay | Admitting: Hematology

## 2019-02-01 ENCOUNTER — Other Ambulatory Visit: Payer: Self-pay

## 2019-02-01 VITALS — BP 143/68 | HR 87 | Temp 98.2°F | Resp 20 | Ht 70.0 in | Wt 200.9 lb

## 2019-02-01 DIAGNOSIS — F17210 Nicotine dependence, cigarettes, uncomplicated: Secondary | ICD-10-CM

## 2019-02-01 DIAGNOSIS — I10 Essential (primary) hypertension: Secondary | ICD-10-CM | POA: Diagnosis not present

## 2019-02-01 DIAGNOSIS — I700 Atherosclerosis of aorta: Secondary | ICD-10-CM

## 2019-02-01 DIAGNOSIS — Z79899 Other long term (current) drug therapy: Secondary | ICD-10-CM

## 2019-02-01 DIAGNOSIS — M47816 Spondylosis without myelopathy or radiculopathy, lumbar region: Secondary | ICD-10-CM | POA: Diagnosis not present

## 2019-02-01 DIAGNOSIS — Z9221 Personal history of antineoplastic chemotherapy: Secondary | ICD-10-CM | POA: Diagnosis not present

## 2019-02-01 DIAGNOSIS — Z923 Personal history of irradiation: Secondary | ICD-10-CM | POA: Diagnosis not present

## 2019-02-01 DIAGNOSIS — C20 Malignant neoplasm of rectum: Secondary | ICD-10-CM

## 2019-02-01 DIAGNOSIS — I2510 Atherosclerotic heart disease of native coronary artery without angina pectoris: Secondary | ICD-10-CM | POA: Diagnosis not present

## 2019-02-01 NOTE — Telephone Encounter (Signed)
Gave avs and calendar ° °

## 2019-03-19 NOTE — Progress Notes (Signed)
Follow up labs completed 09/07/2018

## 2019-03-26 ENCOUNTER — Other Ambulatory Visit: Payer: Self-pay | Admitting: Emergency Medicine

## 2019-03-26 ENCOUNTER — Telehealth: Payer: Self-pay | Admitting: Emergency Medicine

## 2019-03-26 ENCOUNTER — Other Ambulatory Visit: Payer: Self-pay

## 2019-03-26 DIAGNOSIS — I10 Essential (primary) hypertension: Secondary | ICD-10-CM

## 2019-03-26 MED ORDER — HYDROCHLOROTHIAZIDE 25 MG PO TABS: 25.0000 mg | ORAL_TABLET | Freq: Every day | ORAL | 0 refills | Status: DC

## 2019-03-26 NOTE — Telephone Encounter (Signed)
Requested medication (s) are due for refill today: no  Requested medication (s) are on the active medication list: no  Last refill: 12/25/2018  Future visit scheduled: yes  Notes to clinic:  The original prescription was discontinued on 02/01/2019 by Jonnie Finner, RN for the following reason: Error. Renewing this prescription may not be appropriate.   Requested Prescriptions  Pending Prescriptions Disp Refills   olmesartan-hydrochlorothiazide (BENICAR HCT) 40-25 MG tablet [Pharmacy Med Name: OLMESARTAN MEDOX/HCTZ 40-25MG  TAB] 90 tablet 0    Sig: TAKE 1 TABLET BY MOUTH DAILY     Cardiovascular: ARB + Diuretic Combos Failed - 03/26/2019 11:38 AM      Failed - Last BP in normal range    BP Readings from Last 1 Encounters:  02/01/19 (!) 143/68         Failed - Valid encounter within last 6 months    Recent Outpatient Visits          12 months ago Dyslipidemia   Primary Care at [place], Ines Bloomer, MD   1 year ago Essential hypertension   Primary Care at [place], Ines Bloomer, MD   2 years ago Rectal cancer [facility])   [facility] Care at [facility], Carroll Sage, [place]   3 years ago Rectal bleeding   Primary Care at [facility], MD      Future Appointments            In 1 month Sagardia, Ines Bloomer, MD Primary Care at [place], [place] in normal range and within 180 days    Potassium  Date Value Ref Range Status  01/30/2019 3.9 3.5 - 5.1 mmol/L Final  04/24/2017 4.0 3.5 - 5.1 mEq/L Final         Passed - Na in normal range and within 180 days    Sodium  Date Value Ref Range Status  01/30/2019 138 135 - 145 mmol/L Final  09/07/2018 141 134 - 144 mmol/L Final  04/24/2017 138 136 - 145 mEq/L Final         Passed - Cr in normal range and within 180 days    Creatinine  Date Value Ref Range Status  04/24/2017 1.0 0.7 - 1.3 mg/dL Final   Creatinine, Ser  Date Value Ref Range Status  01/30/2019 1.19 0.61 - 1.24 mg/dL Final          Passed - Ca in normal range and within 180 days    Calcium  Date Value Ref Range Status  01/30/2019 9.6 8.9 - 10.3 mg/dL Final  04/24/2017 9.8 8.4 - 10.4 mg/dL Final         Passed - Patient is not pregnant

## 2019-03-27 NOTE — Telephone Encounter (Signed)
Daughter in law calling because pt actually is on another med, refilled last 9/19 for a year.  the HCT was sent yesterday.  Can you send  amLODipine (NORVASC) 5 MG    [id number] - [place], [place] - [address] AT [facility] [address] [phone] (Phone) [phone] (Fax)   Pt has appt 04/25/19 and this will get him through to appt.

## 2019-04-05 ENCOUNTER — Other Ambulatory Visit: Payer: Self-pay | Admitting: Emergency Medicine

## 2019-04-05 MED ORDER — AMLODIPINE BESYLATE 5 MG PO TABS: 5.0000 mg | ORAL_TABLET | Freq: Every day | ORAL | 0 refills | Status: DC

## 2019-04-05 NOTE — Telephone Encounter (Signed)
rx has been sent in to pharmacy. Pt daughter in law notified

## 2019-04-05 NOTE — Telephone Encounter (Signed)
Requested medication (s) are due for refill today: yes  Requested medication (s) are on the active medication list: yes  Last refill:  04/05/2019  Future visit scheduled: yes  Notes to clinic requesting 90 day supply   Requested Prescriptions  Pending Prescriptions Disp Refills   amLODipine (NORVASC) 5 MG tablet [Pharmacy Med Name: AMLODIPINE BESYLATE 5MG  TABLETS] 90 tablet     Sig: TAKE 1 TABLET(5 MG) BY MOUTH DAILY     Cardiovascular:  Calcium Channel Blockers Failed - 04/05/2019 10:16 AM      Failed - Last BP in normal range    BP Readings from Last 1 Encounters:  02/01/19 (!) 143/68         Failed - Valid encounter within last 6 months    Recent Outpatient Visits          1 year ago Dyslipidemia   [facility] [place], Ines Bloomer, MD   2 years ago Essential hypertension   [facility] [place], Ines Bloomer, MD   2 years ago Rectal cancer [facility])   [facility], Carroll Sage, [place]   3 years ago Rectal bleeding   [facility] [place], MD      Future Appointments            In 2 weeks Sagardia, Ines Bloomer, MD [facility] [place], [facility]

## 2019-04-25 ENCOUNTER — Encounter: Payer: Self-pay | Admitting: Emergency Medicine

## 2019-04-25 ENCOUNTER — Other Ambulatory Visit: Payer: Self-pay

## 2019-04-25 ENCOUNTER — Ambulatory Visit (INDEPENDENT_AMBULATORY_CARE_PROVIDER_SITE_OTHER): Payer: No Typology Code available for payment source | Admitting: Emergency Medicine

## 2019-04-25 VITALS — BP 144/79 | HR 64 | Temp 98.0°F | Resp 16 | Ht 70.87 in | Wt 204.0 lb

## 2019-04-25 DIAGNOSIS — Z23 Encounter for immunization: Secondary | ICD-10-CM | POA: Diagnosis not present

## 2019-04-25 DIAGNOSIS — I10 Essential (primary) hypertension: Secondary | ICD-10-CM

## 2019-04-25 DIAGNOSIS — Z85048 Personal history of other malignant neoplasm of rectum, rectosigmoid junction, and anus: Secondary | ICD-10-CM

## 2019-04-25 MED ORDER — HYDROCHLOROTHIAZIDE 25 MG PO TABS: 25.0000 mg | ORAL_TABLET | Freq: Every day | ORAL | 3 refills | Status: DC

## 2019-04-25 MED ORDER — AMLODIPINE BESYLATE 5 MG PO TABS: 5.0000 mg | ORAL_TABLET | Freq: Every day | ORAL | 3 refills | Status: DC

## 2019-04-25 NOTE — Progress Notes (Signed)
BP Readings from Last 3 Encounters:  04/25/19 (!) 144/79  02/01/19 (!) 143/68  09/07/18 136/78   Lab Results  Component Value Date   HGBA1C 6.5 (H) 09/07/2018   [place] Daquila 68 y.o.   Chief Complaint  Patient presents with  . Medication Refill    Amlodipine and Hctz    HISTORY OF PRESENT ILLNESS: This is a 68 y.o. male with history of hypertension here for follow-up. Presently taking amlodipine and hydrochlorothiazide. Also has a history of rectal cancer.  Doing well.  Last CEA was within normal limits, July 2020. Has no complaints or medical concerns today.  Daughter helping with translation.  HPI   Prior to Admission medications   Medication Sig Start Date End Date Taking? Authorizing Provider  amLODipine (NORVASC) 5 MG tablet Take 1 tablet (5 mg total) by mouth daily. 04/05/19  Yes Aslyn Cottman, Ines Bloomer, MD  hydrochlorothiazide (HYDRODIURIL) 25 MG tablet Take 1 tablet (25 mg total) by mouth daily. 03/26/19  Yes SagardiaInes Bloomer, MD    No Known Allergies  Patient Active Problem List   Diagnosis Date Noted  . Dyslipidemia 03/30/2018  . Prediabetes 03/30/2018  . Rectal cancer ([place]) 12/07/2015  . Essential hypertension 11/15/2015    Past Medical History:  Diagnosis Date  . Hypertension     Past Surgical History:  Procedure Laterality Date  . EUS N/A 12/09/2015   Procedure: LOWER ENDOSCOPIC ULTRASOUND (EUS);  Surgeon: [name], MD;  Location: [facility];  Service: Endoscopy;  Laterality: N/A;  . XI ROBOTIC ASSISTED LOWER ANTERIOR RESECTION N/A 03/25/2016   Procedure: XI ROBOTIC ASSISTED LOWER ANTERIOR RESECTION;  Surgeon: Leighton Ruff, MD;  Location: [facility];  Service: General;  Laterality: N/A;    Social History   Socioeconomic History  . Marital status: Married    Spouse name: Not on file  . Number of children: 2  . Years of education: Not on file  . Highest education level: Not on file  Occupational History  . Not on file  Social Needs  .  Financial resource strain: Not on file  . Food insecurity    Worry: Not on file    Inability: Not on file  . Transportation needs    Medical: Not on file    Non-medical: Not on file  Tobacco Use  . Smoking status: Current Every Day Smoker    Packs/day: 0.50    Years: 35.00    Pack years: 17.50  . Smokeless tobacco: Never Used  Substance and Sexual Activity  . Alcohol use: No    Alcohol/week: 2.0 - 3.0 standard drinks    Types: 2 - 3 Cans of beer per week  . Drug use: No  . Sexual activity: Not on file  Lifestyle  . Physical activity    Days per week: Not on file    Minutes per session: Not on file  . Stress: Not on file  Relationships  . Social Herbalist on phone: Not on file    Gets together: Not on file    Attends religious service: Not on file    Active member of club or organization: Not on file    Attends meetings of clubs or organizations: Not on file    Relationship status: Not on file  . Intimate partner violence    Fear of current or ex partner: Not on file    Emotionally abused: Not on file    Physically abused: Not on file    Forced  sexual activity: Not on file  Other Topics Concern  . Not on file  Social History Narrative   Married, lives w/his wife and a son--minimal English   Works full time at [employer]   Current everyday smoker, with no plans to quit   Primary contact is daughter-in-law, Derell Bent (can interpret for him)    Family History  Problem Relation Age of Onset  . Cancer Mother 84       throid cancer   . Hyperlipidemia Mother   . Diabetes Father      Review of Systems  Constitutional: Negative.  Negative for chills and fever.  HENT: Negative.  Negative for congestion and sore throat.   Eyes: Negative.   Respiratory: Negative.  Negative for cough and shortness of breath.   Cardiovascular: Negative.  Negative for chest pain and palpitations.  Gastrointestinal: Negative for abdominal pain, blood in stool,  diarrhea, nausea and vomiting.  Genitourinary: Negative.  Negative for dysuria.  Musculoskeletal: Negative.   Skin: Negative.  Negative for rash.  Neurological: Negative.  Negative for dizziness and headaches.  Endo/Heme/Allergies: Negative.   All other systems reviewed and are negative.    Today's Vitals   04/25/19 0810  BP: (!) 144/79  Pulse: 64  Resp: 16  Temp: 98 F (36.7 C)  TempSrc: Oral  SpO2: 98%  Weight: 204 lb (92.5 kg)  Height: 5' 10.87" (1.8 m)   Body mass index is 28.56 kg/m.   Physical Exam Vitals signs reviewed.  Constitutional:      Appearance: Normal appearance.  HENT:     Head: Normocephalic.  Eyes:     Extraocular Movements: Extraocular movements intact.     Conjunctiva/sclera: Conjunctivae normal.     Pupils: Pupils are equal, round, and reactive to light.  Neck:     Musculoskeletal: Normal range of motion and neck supple.  Cardiovascular:     Rate and Rhythm: Normal rate and regular rhythm.     Pulses: Normal pulses.     Heart sounds: Normal heart sounds.  Pulmonary:     Effort: Pulmonary effort is normal.     Breath sounds: Normal breath sounds.  Abdominal:     General: There is no distension.     Palpations: Abdomen is soft.     Tenderness: There is no abdominal tenderness.  Musculoskeletal: Normal range of motion.  Skin:    General: Skin is warm and dry.     Capillary Refill: Capillary refill takes less than 2 seconds.  Neurological:     General: No focal deficit present.     Mental Status: He is alert and oriented to person, place, and time.  Psychiatric:        Mood and Affect: Mood normal.        Behavior: Behavior normal.     A total of 25 minutes was spent in the room with the patient, greater than 50% of which was in counseling/coordination of care regarding hypertension and management, review of blood work and chest/abdomen CT scans done last July, medications, doses, and side effects, diet and nutrition, prognosis, and need  for follow-up.  ASSESSMENT & PLAN: Castin was seen today for medication refill.  Diagnoses and all orders for this visit:  Essential hypertension -     amLODipine (NORVASC) 5 MG tablet; Take 1 tablet (5 mg total) by mouth daily. -     hydrochlorothiazide (HYDRODIURIL) 25 MG tablet; Take 1 tablet (25 mg total) by mouth daily. -  Lipid panel  Need for prophylactic vaccination and inoculation against influenza -     Flu Vaccine QUAD High Dose(Fluad)  Need for vaccination against Streptococcus pneumoniae using pneumococcal conjugate vaccine 13 -     Pneumococcal conjugate vaccine 13-valent IM  History of rectal cancer    Patient Instructions       If you have lab work done today you will be contacted with your lab results within the next 2 weeks.  If you have not heard from [place] then please contact us. The fastest way to get your results is to register for My Chart.   IF you received an x-ray today, you will receive an invoice from [facility]. Please contact [facility] at [phone] with questions or concerns regarding your invoice.   IF you received labwork today, you will receive an invoice from [facility]. Please contact [facility] at [phone] with questions or concerns regarding your invoice.   Our billing staff will not be able to assist you with questions regarding bills from these companies.  You will be contacted with the lab results as soon as they are available. The fastest way to get your results is to activate your My Chart account. Instructions are located on the last page of this paperwork. If you have not heard from [place] regarding the results in 2 weeks, please contact this office.     Hypertension, Adult High blood pressure (hypertension) is when the force of blood pumping through the arteries is too strong. The arteries are the blood vessels that carry blood from the heart throughout the body. Hypertension forces the heart to work harder to  pump blood and may cause arteries to become narrow or stiff. Untreated or uncontrolled hypertension can cause a heart attack, heart failure, a stroke, kidney disease, and other problems. A blood pressure reading consists of a higher number over a lower number. Ideally, your blood pressure should be below 120/80. The first ("top") number is called the systolic pressure. It is a measure of the pressure in your arteries as your heart beats. The second ("bottom") number is called the diastolic pressure. It is a measure of the pressure in your arteries as the heart relaxes. What are the causes? The exact cause of this condition is not known. There are some conditions that result in or are related to high blood pressure. What increases the risk? Some risk factors for high blood pressure are under your control. The following factors may make you more likely to develop this condition:  Smoking.  Having type 2 diabetes mellitus, high cholesterol, or both.  Not getting enough exercise or physical activity.  Being overweight.  Having too much fat, sugar, calories, or salt (sodium) in your diet.  Drinking too much alcohol. Some risk factors for high blood pressure may be difficult or impossible to change. Some of these factors include:  Having chronic kidney disease.  Having a family history of high blood pressure.  Age. Risk increases with age.  Race. You may be at higher risk if you are African American.  Gender. Men are at higher risk than women before age 17. After age 60, women are at higher risk than men.  Having obstructive sleep apnea.  Stress. What are the signs or symptoms? High blood pressure may not cause symptoms. Very high blood pressure (hypertensive crisis) may cause:  Headache.  Anxiety.  Shortness of breath.  Nosebleed.  Nausea and vomiting.  Vision changes.  Severe chest pain.  Seizures. How is this  diagnosed? This condition is diagnosed by measuring your  blood pressure while you are seated, with your arm resting on a flat surface, your legs uncrossed, and your feet flat on the floor. The cuff of the blood pressure monitor will be placed directly against the skin of your upper arm at the level of your heart. It should be measured at least twice using the same arm. Certain conditions can cause a difference in blood pressure between your right and left arms. Certain factors can cause blood pressure readings to be lower or higher than normal for a short period of time:  When your blood pressure is higher when you are in a health care provider's office than when you are at home, this is called white coat hypertension. Most people with this condition do not need medicines.  When your blood pressure is higher at home than when you are in a health care provider's office, this is called masked hypertension. Most people with this condition may need medicines to control blood pressure. If you have a high blood pressure reading during one visit or you have normal blood pressure with other risk factors, you may be asked to:  Return on a different day to have your blood pressure checked again.  Monitor your blood pressure at home for 1 week or longer. If you are diagnosed with hypertension, you may have other blood or imaging tests to help your health care provider understand your overall risk for other conditions. How is this treated? This condition is treated by making healthy lifestyle changes, such as eating healthy foods, exercising more, and reducing your alcohol intake. Your health care provider may prescribe medicine if lifestyle changes are not enough to get your blood pressure under control, and if:  Your systolic blood pressure is above 130.  Your diastolic blood pressure is above 80. Your personal target blood pressure may vary depending on your medical conditions, your age, and other factors. Follow these instructions at home: Eating and drinking    Eat a diet that is high in fiber and potassium, and low in sodium, added sugar, and fat. An example eating plan is called the DASH (Dietary Approaches to Stop Hypertension) diet. To eat this way: ? Eat plenty of fresh fruits and vegetables. Try to fill one half of your plate at each meal with fruits and vegetables. ? Eat whole grains, such as whole-wheat pasta, brown rice, or whole-grain bread. Fill about one fourth of your plate with whole grains. ? Eat or drink low-fat dairy products, such as skim milk or low-fat yogurt. ? Avoid fatty cuts of meat, processed or cured meats, and poultry with skin. Fill about one fourth of your plate with lean proteins, such as fish, chicken without skin, beans, eggs, or tofu. ? Avoid pre-made and processed foods. These tend to be higher in sodium, added sugar, and fat.  Reduce your daily sodium intake. Most people with hypertension should eat less than 1,500 mg of sodium a day.  Do not drink alcohol if: ? Your health care provider tells you not to drink. ? You are pregnant, may be pregnant, or are planning to become pregnant.  If you drink alcohol: ? Limit how much you use to:  0-1 drink a day for women.  0-2 drinks a day for men. ? Be aware of how much alcohol is in your drink. In the [place] one drink equals one 12 oz bottle of beer (355 mL), one 5 oz glass of wine (148 mL),  or one 1 oz glass of hard liquor (44 mL). Lifestyle   Work with your health care provider to maintain a healthy body weight or to lose weight. Ask what an ideal weight is for you.  Get at least 30 minutes of exercise most days of the week. Activities may include walking, swimming, or biking.  Include exercise to strengthen your muscles (resistance exercise), such as Pilates or lifting weights, as part of your weekly exercise routine. Try to do these types of exercises for 30 minutes at least 3 days a week.  Do not use any products that contain nicotine or tobacco, such as  cigarettes, e-cigarettes, and chewing tobacco. If you need help quitting, ask your health care provider.  Monitor your blood pressure at home as told by your health care provider.  Keep all follow-up visits as told by your health care provider. This is important. Medicines  Take over-the-counter and prescription medicines only as told by your health care provider. Follow directions carefully. Blood pressure medicines must be taken as prescribed.  Do not skip doses of blood pressure medicine. Doing this puts you at risk for problems and can make the medicine less effective.  Ask your health care provider about side effects or reactions to medicines that you should watch for. Contact a health care provider if you:  Think you are having a reaction to a medicine you are taking.  Have headaches that keep coming back (recurring).  Feel dizzy.  Have swelling in your ankles.  Have trouble with your vision. Get help right away if you:  Develop a severe headache or confusion.  Have unusual weakness or numbness.  Feel faint.  Have severe pain in your chest or abdomen.  Vomit repeatedly.  Have trouble breathing. Summary  Hypertension is when the force of blood pumping through your arteries is too strong. If this condition is not controlled, it may put you at risk for serious complications.  Your personal target blood pressure may vary depending on your medical conditions, your age, and other factors. For most people, a normal blood pressure is less than 120/80.  Hypertension is treated with lifestyle changes, medicines, or a combination of both. Lifestyle changes include losing weight, eating a healthy, low-sodium diet, exercising more, and limiting alcohol. This information is not intended to replace advice given to you by your health care provider. Make sure you discuss any questions you have with your health care provider. Document Released: 07/11/2005 Document Revised: 03/21/2018  Document Reviewed: 03/21/2018 Elsevier Patient Education  2020 [employer]      Agustina Caroli, MD [facility]

## 2019-04-25 NOTE — Patient Instructions (Addendum)
   If you have lab work done today you will be contacted with your lab results within the next 2 weeks.  If you have not heard from us then please contact us. The fastest way to get your results is to register for My Chart.   IF you received an x-ray today, you will receive an invoice from [facility]. Please contact [facility] at [phone] with questions or concerns regarding your invoice.   IF you received labwork today, you will receive an invoice from [facility]. Please contact [facility] at [phone] with questions or concerns regarding your invoice.   Our billing staff will not be able to assist you with questions regarding bills from these companies.  You will be contacted with the lab results as soon as they are available. The fastest way to get your results is to activate your My Chart account. Instructions are located on the last page of this paperwork. If you have not heard from us regarding the results in 2 weeks, please contact this office.     Hypertension, Adult High blood pressure (hypertension) is when the force of blood pumping through the arteries is too strong. The arteries are the blood vessels that carry blood from the heart throughout the body. Hypertension forces the heart to work harder to pump blood and may cause arteries to become narrow or stiff. Untreated or uncontrolled hypertension can cause a heart attack, heart failure, a stroke, kidney disease, and other problems. A blood pressure reading consists of a higher number over a lower number. Ideally, your blood pressure should be below 120/80. The first ("top") number is called the systolic pressure. It is a measure of the pressure in your arteries as your heart beats. The second ("bottom") number is called the diastolic pressure. It is a measure of the pressure in your arteries as the heart relaxes. What are the causes? The exact cause of this condition is not known. There are some conditions  that result in or are related to high blood pressure. What increases the risk? Some risk factors for high blood pressure are under your control. The following factors may make you more likely to develop this condition:  Smoking.  Having type 2 diabetes mellitus, high cholesterol, or both.  Not getting enough exercise or physical activity.  Being overweight.  Having too much fat, sugar, calories, or salt (sodium) in your diet.  Drinking too much alcohol. Some risk factors for high blood pressure may be difficult or impossible to change. Some of these factors include:  Having chronic kidney disease.  Having a family history of high blood pressure.  Age. Risk increases with age.  Race. You may be at higher risk if you are African American.  Gender. Men are at higher risk than women before age 45. After age 65, women are at higher risk than men.  Having obstructive sleep apnea.  Stress. What are the signs or symptoms? High blood pressure may not cause symptoms. Very high blood pressure (hypertensive crisis) may cause:  Headache.  Anxiety.  Shortness of breath.  Nosebleed.  Nausea and vomiting.  Vision changes.  Severe chest pain.  Seizures. How is this diagnosed? This condition is diagnosed by measuring your blood pressure while you are seated, with your arm resting on a flat surface, your legs uncrossed, and your feet flat on the floor. The cuff of the blood pressure monitor will be placed directly against the skin of your upper arm at the level of your heart.   It should be measured at least twice using the same arm. Certain conditions can cause a difference in blood pressure between your right and left arms. Certain factors can cause blood pressure readings to be lower or higher than normal for a short period of time:  When your blood pressure is higher when you are in a health care provider's office than when you are at home, this is called white coat hypertension.  Most people with this condition do not need medicines.  When your blood pressure is higher at home than when you are in a health care provider's office, this is called masked hypertension. Most people with this condition may need medicines to control blood pressure. If you have a high blood pressure reading during one visit or you have normal blood pressure with other risk factors, you may be asked to:  Return on a different day to have your blood pressure checked again.  Monitor your blood pressure at home for 1 week or longer. If you are diagnosed with hypertension, you may have other blood or imaging tests to help your health care provider understand your overall risk for other conditions. How is this treated? This condition is treated by making healthy lifestyle changes, such as eating healthy foods, exercising more, and reducing your alcohol intake. Your health care provider may prescribe medicine if lifestyle changes are not enough to get your blood pressure under control, and if:  Your systolic blood pressure is above 130.  Your diastolic blood pressure is above 80. Your personal target blood pressure may vary depending on your medical conditions, your age, and other factors. Follow these instructions at home: Eating and drinking   Eat a diet that is high in fiber and potassium, and low in sodium, added sugar, and fat. An example eating plan is called the DASH (Dietary Approaches to Stop Hypertension) diet. To eat this way: ? Eat plenty of fresh fruits and vegetables. Try to fill one half of your plate at each meal with fruits and vegetables. ? Eat whole grains, such as whole-wheat pasta, brown rice, or whole-grain bread. Fill about one fourth of your plate with whole grains. ? Eat or drink low-fat dairy products, such as skim milk or low-fat yogurt. ? Avoid fatty cuts of meat, processed or cured meats, and poultry with skin. Fill about one fourth of your plate with lean proteins, such  as fish, chicken without skin, beans, eggs, or tofu. ? Avoid pre-made and processed foods. These tend to be higher in sodium, added sugar, and fat.  Reduce your daily sodium intake. Most people with hypertension should eat less than 1,500 mg of sodium a day.  Do not drink alcohol if: ? Your health care provider tells you not to drink. ? You are pregnant, may be pregnant, or are planning to become pregnant.  If you drink alcohol: ? Limit how much you use to:  0-1 drink a day for women.  0-2 drinks a day for men. ? Be aware of how much alcohol is in your drink. In the [place] one drink equals one 12 oz bottle of beer (355 mL), one 5 oz glass of wine (148 mL), or one 1 oz glass of hard liquor (44 mL). Lifestyle   Work with your health care provider to maintain a healthy body weight or to lose weight. Ask what an ideal weight is for you.  Get at least 30 minutes of exercise most days of the week. Activities may include walking, swimming, or   biking.  Include exercise to strengthen your muscles (resistance exercise), such as Pilates or lifting weights, as part of your weekly exercise routine. Try to do these types of exercises for 30 minutes at least 3 days a week.  Do not use any products that contain nicotine or tobacco, such as cigarettes, e-cigarettes, and chewing tobacco. If you need help quitting, ask your health care provider.  Monitor your blood pressure at home as told by your health care provider.  Keep all follow-up visits as told by your health care provider. This is important. Medicines  Take over-the-counter and prescription medicines only as told by your health care provider. Follow directions carefully. Blood pressure medicines must be taken as prescribed.  Do not skip doses of blood pressure medicine. Doing this puts you at risk for problems and can make the medicine less effective.  Ask your health care provider about side effects or reactions to medicines that you  should watch for. Contact a health care provider if you:  Think you are having a reaction to a medicine you are taking.  Have headaches that keep coming back (recurring).  Feel dizzy.  Have swelling in your ankles.  Have trouble with your vision. Get help right away if you:  Develop a severe headache or confusion.  Have unusual weakness or numbness.  Feel faint.  Have severe pain in your chest or abdomen.  Vomit repeatedly.  Have trouble breathing. Summary  Hypertension is when the force of blood pumping through your arteries is too strong. If this condition is not controlled, it may put you at risk for serious complications.  Your personal target blood pressure may vary depending on your medical conditions, your age, and other factors. For most people, a normal blood pressure is less than 120/80.  Hypertension is treated with lifestyle changes, medicines, or a combination of both. Lifestyle changes include losing weight, eating a healthy, low-sodium diet, exercising more, and limiting alcohol. This information is not intended to replace advice given to you by your health care provider. Make sure you discuss any questions you have with your health care provider. Document Released: 07/11/2005 Document Revised: 03/21/2018 Document Reviewed: 03/21/2018 Elsevier Patient Education  2020 Elsevier Inc.  

## 2019-04-26 LAB — LIPID PANEL
Chol/HDL Ratio: 6.7 ratio — ABNORMAL HIGH (ref 0.0–5.0)
Cholesterol, Total: 215 mg/dL — ABNORMAL HIGH (ref 100–199)
HDL: 32 mg/dL — ABNORMAL LOW (ref >39)
LDL Chol Calc (NIH): 132 mg/dL — ABNORMAL HIGH (ref 0–99)
Triglycerides: 281 mg/dL — ABNORMAL HIGH (ref 0–149)
VLDL Cholesterol Cal: 51 mg/dL — ABNORMAL HIGH (ref 5–40)

## 2019-04-27 ENCOUNTER — Other Ambulatory Visit: Payer: Self-pay | Admitting: Emergency Medicine

## 2019-04-27 DIAGNOSIS — E785 Hyperlipidemia, unspecified: Secondary | ICD-10-CM

## 2019-04-27 MED ORDER — ROSUVASTATIN CALCIUM 20 MG PO TABS: 20.0000 mg | ORAL_TABLET | Freq: Every day | ORAL | 3 refills | Status: DC

## 2019-08-15 ENCOUNTER — Telehealth: Payer: Self-pay | Admitting: *Deleted

## 2019-08-15 NOTE — Telephone Encounter (Signed)
RN notified by HR manager Hyacinth Meeker of pt's spouse reporting chills, body aches that began last night 1/20. Spouse also employee of [employer]. Pt is asymptomatic. Pt requested through HR that his daughter be their point of contact due to language barrier.  Spoke with daughter [name] over phone. She confirms that pt remains asymptomatic. Advised daughter that pt is to follow 14 day asymptomatic with exposure to potentially positive person quarantine per CDC recommendations due to being in same household with symptomatic person. Advised that Day 1 of quarantine is 08/15/19. Plan for testing on Day 6, date 08/20/19. Pt prefers self scheduling at an UC facility near their home. Recommended to daughter testing on 1/26. Advised that they will need to contact clinic with results of testing due to not being able to view results in [facility]. Will f/u pt on 1/28 with anticipated results. If sx improving and no fever in previous >24hrs without antipyretics, pt to complete quarantine 08/28/19 and return to work on next scheduled work date of 08/29/19.    Reviewed possible Covid sx including cough, ShOB, sinusitis sx, sore throat, fever/chills, body aches, fatigue, loss of taste/smell, GI sx n/v/d. Also reviewed same day/emergent eval/ER precautions of dizziness/syncope, confusion, blue tint to lips/face, severe ShOB/difficulty breathing.    Daughter verbalizes understanding and agreement with plan of care. No further questions/concerns at this time. Reminded to contact clinic with any changes in sx or questions/concerns.

## 2019-08-15 NOTE — Telephone Encounter (Signed)
Noted agreed with plan of care per CDC guidelines

## 2019-08-21 ENCOUNTER — Encounter: Payer: Self-pay | Admitting: *Deleted

## 2019-08-21 NOTE — Telephone Encounter (Signed)
Notified by HR Tim today patient called him to report his test negative but spouse test positive.  Will need to verify with patient if during quarantine they are able to each have separate spaces/bathroom and if not his quarantine will have to be extended to 14 days after his spouse positive test.  She received her results on 08/17/2019 per Tim.

## 2019-08-23 NOTE — Telephone Encounter (Signed)
Spoke with pt's daughter over phone as per his request due to language barrier. She reports that pt still has no sx. He is quarantined at home, in separate area from wife whom tested positive. Confirmed since he has remained separated, his quarantine will end on 08/28/19 and he can RTW 08/29/19. Daughter verbalizes understanding and agreement with continued quarantine, contacting clinic if any change in pt status, and denies any additional questions or concerns.

## 2019-08-23 NOTE — Telephone Encounter (Signed)
Noted agree with plan of care to continue quarantine and monitoring symptoms per CDC guidelines

## 2019-08-25 NOTE — Telephone Encounter (Signed)
Left message for patient to contact me at [email or web address] or RN Hildred Alamin at [phone] M, Tu, Th, Fr for status update on symptoms prior to return to work.

## 2019-08-26 NOTE — Telephone Encounter (Signed)
noted 

## 2019-08-26 NOTE — Telephone Encounter (Signed)
Spoke with daughter [name] over phone. She reports that pt still feels well and denies any sx. He is planning to return to work on 08/29/19 and daughter is to call clinic if any change in pt sx or status prior to his return. She verbalizes understanding and agreement with plan of care. No further questions/concerns.

## 2019-09-01 NOTE — Telephone Encounter (Signed)
Telephone message left for patient I was calling to check on him and wife since I did not see them at work on Thursday.  I wanted to ensure they were feeling better, verify if returned to work and if they had any questions or concerns.  Message left clinic closed tomorrow RN Hildred Alamin on vacation.  Both of [place] will be in clinic Tuesday and I can be reached via mychart or [email or web address] if questions or concerns prior to Tuesday.

## 2019-09-05 NOTE — Telephone Encounter (Signed)
Patient 2nd shift doesn't start work until 3pm; returned to work as expected.

## 2019-10-11 ENCOUNTER — Ambulatory Visit: Payer: PRIVATE HEALTH INSURANCE | Attending: Internal Medicine

## 2019-10-11 DIAGNOSIS — Z23 Encounter for immunization: Secondary | ICD-10-CM

## 2019-10-11 NOTE — Progress Notes (Signed)
   Covid-19 Vaccination Clinic  Name:  David Jacobs    MRN: [id number] DOB: [birth year]  10/11/2019  [name] was observed post Covid-19 immunization for 15 minutes without incident. He was provided with Vaccine Information Sheet and instruction to access the V-Safe system.   [name] was instructed to call 911 with any severe reactions post vaccine: Marland Kitchen Difficulty breathing  . Swelling of face and throat  . A fast heartbeat  . A bad rash all over body  . Dizziness and weakness   Immunizations Administered    Name Date Dose VIS Date Route   Pfizer COVID-19 Vaccine 10/11/2019 11:21 AM 0.3 mL 07/05/2019 Intramuscular   Manufacturer: Jenner   Lot: [id number]   [place]: [id number]

## 2019-10-17 ENCOUNTER — Ambulatory Visit: Payer: Medicare Other | Admitting: Emergency Medicine

## 2019-10-25 ENCOUNTER — Ambulatory Visit: Payer: Medicare Other | Admitting: Emergency Medicine

## 2019-10-29 ENCOUNTER — Ambulatory Visit (INDEPENDENT_AMBULATORY_CARE_PROVIDER_SITE_OTHER): Payer: No Typology Code available for payment source | Admitting: Registered Nurse

## 2019-10-29 ENCOUNTER — Encounter: Payer: Self-pay | Admitting: Registered Nurse

## 2019-10-29 ENCOUNTER — Other Ambulatory Visit: Payer: Self-pay

## 2019-10-29 VITALS — BP 140/78 | HR 63 | Temp 97.7°F | Resp 16 | Ht 70.0 in | Wt 209.8 lb

## 2019-10-29 DIAGNOSIS — Z8639 Personal history of other endocrine, nutritional and metabolic disease: Secondary | ICD-10-CM

## 2019-10-29 DIAGNOSIS — Z130 Encounter for screening for diseases of the blood and blood-forming organs and certain disorders involving the immune mechanism: Secondary | ICD-10-CM

## 2019-10-29 DIAGNOSIS — Z1159 Encounter for screening for other viral diseases: Secondary | ICD-10-CM | POA: Diagnosis not present

## 2019-10-29 DIAGNOSIS — Z1329 Encounter for screening for other suspected endocrine disorder: Secondary | ICD-10-CM | POA: Diagnosis not present

## 2019-10-29 DIAGNOSIS — I10 Essential (primary) hypertension: Secondary | ICD-10-CM | POA: Diagnosis not present

## 2019-10-29 DIAGNOSIS — Z13228 Encounter for screening for other metabolic disorders: Secondary | ICD-10-CM

## 2019-10-29 DIAGNOSIS — E785 Hyperlipidemia, unspecified: Secondary | ICD-10-CM | POA: Diagnosis not present

## 2019-10-29 MED ORDER — HYDROCHLOROTHIAZIDE 25 MG PO TABS: 25.0000 mg | ORAL_TABLET | Freq: Every day | ORAL | 3 refills | Status: DC

## 2019-10-29 MED ORDER — ROSUVASTATIN CALCIUM 20 MG PO TABS: 20.0000 mg | ORAL_TABLET | Freq: Every day | ORAL | 3 refills | Status: DC

## 2019-10-29 MED ORDER — AMLODIPINE BESYLATE 10 MG PO TABS: 10.0000 mg | ORAL_TABLET | Freq: Every day | ORAL | 3 refills | Status: DC

## 2019-10-29 NOTE — Patient Instructions (Signed)
° ° ° °  If you have lab work done today you will be contacted with your lab results within the next 2 weeks.  If you have not heard from us then please contact us. The fastest way to get your results is to register for My Chart. ° ° °IF you received an x-ray today, you will receive an invoice from [facility]. Please contact [facility] at [phone] with questions or concerns regarding your invoice.  ° °IF you received labwork today, you will receive an invoice from [facility]. Please contact [facility] at [phone] with questions or concerns regarding your invoice.  ° °Our billing staff will not be able to assist you with questions regarding bills from these companies. ° °You will be contacted with the lab results as soon as they are available. The fastest way to get your results is to activate your My Chart account. Instructions are located on the last page of this paperwork. If you have not heard from us regarding the results in 2 weeks, please contact this office. °  ° ° ° °

## 2019-10-30 LAB — LIPID PANEL
Chol/HDL Ratio: 3.5 ratio (ref 0.0–5.0)
Cholesterol, Total: 120 mg/dL (ref 100–199)
HDL: 34 mg/dL — ABNORMAL LOW (ref >39)
LDL Chol Calc (NIH): 55 mg/dL (ref 0–99)
Triglycerides: 183 mg/dL — ABNORMAL HIGH (ref 0–149)
VLDL Cholesterol Cal: 31 mg/dL (ref 5–40)

## 2019-10-30 LAB — CBC WITH DIFFERENTIAL/PLATELET
Basophils Absolute: 0.1 10*3/uL (ref 0.0–0.2)
Basos: 1 %
EOS (ABSOLUTE): 0.5 10*3/uL — ABNORMAL HIGH (ref 0.0–0.4)
Eos: 6 %
Hematocrit: 47.8 % (ref 37.5–51.0)
Hemoglobin: 16.2 g/dL (ref 13.0–17.7)
Immature Grans (Abs): 0 10*3/uL (ref 0.0–0.1)
Immature Granulocytes: 0 %
Lymphocytes Absolute: 2 10*3/uL (ref 0.7–3.1)
Lymphs: 24 %
MCH: 31.4 pg (ref 26.6–33.0)
MCHC: 33.9 g/dL (ref 31.5–35.7)
MCV: 93 fL (ref 79–97)
Monocytes Absolute: 0.6 10*3/uL (ref 0.1–0.9)
Monocytes: 7 %
Neutrophils Absolute: 5.3 10*3/uL (ref 1.4–7.0)
Neutrophils: 62 %
Platelets: 279 10*3/uL (ref 150–450)
RBC: 5.16 x10E6/uL (ref 4.14–5.80)
RDW: 14.1 % (ref 11.6–15.4)
WBC: 8.4 10*3/uL (ref 3.4–10.8)

## 2019-10-30 LAB — TSH: TSH: 2.86 u[IU]/mL (ref 0.450–4.500)

## 2019-10-30 LAB — HEMOGLOBIN A1C
Est. average glucose Bld gHb Est-mCnc: 143 mg/dL
Hgb A1c MFr Bld: 6.6 % — ABNORMAL HIGH (ref 4.8–5.6)

## 2019-10-30 LAB — COMPREHENSIVE METABOLIC PANEL
ALT: 23 IU/L (ref 0–44)
AST: 20 IU/L (ref 0–40)
Albumin/Globulin Ratio: 2 (ref 1.2–2.2)
Albumin: 4.6 g/dL (ref 3.8–4.8)
Alkaline Phosphatase: 70 IU/L (ref 39–117)
BUN/Creatinine Ratio: 18 (ref 10–24)
BUN: 18 mg/dL (ref 8–27)
Bilirubin Total: 0.4 mg/dL (ref 0.0–1.2)
CO2: 22 mmol/L (ref 20–29)
Calcium: 9.6 mg/dL (ref 8.6–10.2)
Chloride: 101 mmol/L (ref 96–106)
Creatinine, Ser: 0.99 mg/dL (ref 0.76–1.27)
GFR calc Af Amer: 89 mL/min/{1.73_m2} (ref >59)
GFR calc non Af Amer: 77 mL/min/{1.73_m2} (ref >59)
Globulin, Total: 2.3 g/dL (ref 1.5–4.5)
Glucose: 130 mg/dL — ABNORMAL HIGH (ref 65–99)
Potassium: 4.4 mmol/L (ref 3.5–5.2)
Sodium: 139 mmol/L (ref 134–144)
Total Protein: 6.9 g/dL (ref 6.0–8.5)

## 2019-10-30 LAB — HEPATITIS C ANTIBODY: Hep C Virus Ab: <0.1 s/co ratio (ref 0.0–0.9)

## 2019-11-05 ENCOUNTER — Ambulatory Visit: Payer: PRIVATE HEALTH INSURANCE | Attending: Internal Medicine

## 2019-11-05 DIAGNOSIS — Z23 Encounter for immunization: Secondary | ICD-10-CM

## 2019-11-05 NOTE — Progress Notes (Signed)
   Covid-19 Vaccination Clinic  Name:  David Jacobs    MRN: [id number] DOB: 1950/08/10  11/05/2019  [name] was observed post Covid-19 immunization for 15 minutes without incident. He was provided with Vaccine Information Sheet and instruction to access the V-Safe system.   [name] was instructed to call 911 with any severe reactions post vaccine: Marland Kitchen Difficulty breathing  . Swelling of face and throat  . A fast heartbeat  . A bad rash all over body  . Dizziness and weakness   Immunizations Administered    Name Date Dose VIS Date Route   Pfizer COVID-19 Vaccine 11/05/2019 12:18 PM 0.3 mL 07/05/2019 Intramuscular   Manufacturer: [employer]   Lot: [id number]   [place]: [id number]

## 2019-11-08 NOTE — Progress Notes (Signed)
Hello,  If we could call David Jacobs and let him know his A1c increased to 6.6, that would be great. He's been borderline for T2DM in the past and while this is technically diagnostic of T2DM, I think we can control this with improved diet and lifestyle modification - eat right and exercise. We can check this again in Oct when he comes to see Sagardia.  Thank you  Kathrin Ruddy, NP

## 2019-11-10 NOTE — Progress Notes (Signed)
Established Patient Office Visit  Subjective:  Patient ID: David Jacobs, male    DOB: [birth year]  Age: 69 y.o. MRN: [id number]  CC:  Chief Complaint  Patient presents with  . Follow-up    6 month follow up on hypertension. No other concerns  . Medication Refill    on all medications    HPI David Jacobs presents for HTN follow up   Feels well. Needs med refills.  Denies headaches, visual changes, chest pain, shob, doe, dependent edema, claudication.  Tolerates meds well  Endorses lifestyle changes to help manage htn including diet and exercise.  Past Medical History:  Diagnosis Date  . Hypertension     Past Surgical History:  Procedure Laterality Date  . EUS N/A 12/09/2015   Procedure: LOWER ENDOSCOPIC ULTRASOUND (EUS);  Surgeon: [name], MD;  Location: [facility];  Service: Endoscopy;  Laterality: N/A;  . XI ROBOTIC ASSISTED LOWER ANTERIOR RESECTION N/A 03/25/2016   Procedure: XI ROBOTIC ASSISTED LOWER ANTERIOR RESECTION;  Surgeon: Leighton Ruff, MD;  Location: [facility];  Service: General;  Laterality: N/A;    Family History  Problem Relation Age of Onset  . Cancer Mother 62       throid cancer   . Hyperlipidemia Mother   . Diabetes Father     Social History   Socioeconomic History  . Marital status: Married    Spouse name: Not on file  . Number of children: 2  . Years of education: Not on file  . Highest education level: Not on file  Occupational History  . Not on file  Tobacco Use  . Smoking status: Current Every Day Smoker    Packs/day: 0.50    Years: 35.00    Pack years: 17.50  . Smokeless tobacco: Never Used  Substance and Sexual Activity  . Alcohol use: No    Alcohol/week: 2.0 - 3.0 standard drinks    Types: 2 - 3 Cans of beer per week  . Drug use: No  . Sexual activity: Not on file  Other Topics Concern  . Not on file  Social History Narrative   Married, lives w/his wife and a son--minimal English   Works full time at [employer]   Current everyday smoker, with no plans to quit   Primary contact is daughter-in-law, Gianni Mihalik (can interpret for him)   Social Determinants of Health   Financial Resource Strain:   . Difficulty of Paying Living Expenses:   Food Insecurity:   . Worried About Charity fundraiser in the Last Year:   . Arboriculturist in the Last Year:   Transportation Needs:   . Film/video editor (Medical):   Marland Kitchen Lack of Transportation (Non-Medical):   Physical Activity:   . Days of Exercise per Week:   . Minutes of Exercise per Session:   Stress:   . Feeling of Stress :   Social Connections:   . Frequency of Communication with Friends and Family:   . Frequency of Social Gatherings with Friends and Family:   . Attends Religious Services:   . Active Member of Clubs or Organizations:   . Attends Archivist Meetings:   Marland Kitchen Marital Status:   Intimate Partner Violence:   . Fear of Current or Ex-Partner:   . Emotionally Abused:   Marland Kitchen Physically Abused:   . Sexually Abused:     Outpatient Medications Prior to Visit  Medication Sig Dispense Refill  . rosuvastatin (CRESTOR) 20 MG tablet  Take 1 tablet (20 mg total) by mouth daily. 90 tablet 3  . amLODipine (NORVASC) 5 MG tablet Take 1 tablet (5 mg total) by mouth daily. 90 tablet 3  . hydrochlorothiazide (HYDRODIURIL) 25 MG tablet Take 1 tablet (25 mg total) by mouth daily. 90 tablet 3   No facility-administered medications prior to visit.    No Known Allergies  ROS Review of Systems  Constitutional: Negative.   HENT: Negative.   Eyes: Negative.   Respiratory: Negative.   Cardiovascular: Negative.   Gastrointestinal: Negative.   Endocrine: Negative.   Genitourinary: Negative.   Musculoskeletal: Negative.   Skin: Negative.   Allergic/Immunologic: Negative.   Neurological: Negative.   Hematological: Negative.   Psychiatric/Behavioral: Negative.   All other systems reviewed and are negative.     Objective:     Physical Exam  Constitutional: He is oriented to person, place, and time. He appears well-developed and well-nourished. No distress.  Cardiovascular: Normal rate, regular rhythm and normal heart sounds. Exam reveals no gallop and no friction rub.  No murmur heard. Pulmonary/Chest: Effort normal and breath sounds normal. No respiratory distress. He has no wheezes. He has no rales. He exhibits no tenderness.  Neurological: He is alert and oriented to person, place, and time.  Skin: Skin is warm and dry. No rash noted. He is not diaphoretic. No erythema. No pallor.  Psychiatric: He has a normal mood and affect. His behavior is normal. Judgment and thought content normal.  Nursing note and vitals reviewed.   BP 140/78   Pulse 63   Temp 97.7 F (36.5 C) (Temporal)   Resp 16   Ht 5' 10"  (1.778 m)   Wt 209 lb 12.8 oz (95.2 kg)   SpO2 97%   BMI 30.10 kg/m  Wt Readings from Last 3 Encounters:  10/29/19 209 lb 12.8 oz (95.2 kg)  04/25/19 204 lb (92.5 kg)  02/01/19 200 lb 14.4 oz (91.1 kg)     There are no preventive care reminders to display for this patient.  There are no preventive care reminders to display for this patient.  Lab Results  Component Value Date   TSH 2.860 10/29/2019   Lab Results  Component Value Date   WBC 8.4 10/29/2019   HGB 16.2 10/29/2019   HCT 47.8 10/29/2019   MCV 93 10/29/2019   PLT 279 10/29/2019   Lab Results  Component Value Date   NA 139 10/29/2019   K 4.4 10/29/2019   CHLORIDE 103 04/24/2017   CO2 22 10/29/2019   GLUCOSE 130 (H) 10/29/2019   BUN 18 10/29/2019   CREATININE 0.99 10/29/2019   BILITOT 0.4 10/29/2019   ALKPHOS 70 10/29/2019   AST 20 10/29/2019   ALT 23 10/29/2019   PROT 6.9 10/29/2019   ALBUMIN 4.6 10/29/2019   CALCIUM 9.6 10/29/2019   ANIONGAP 9 01/30/2019   EGFR 74 (L) 04/24/2017   Lab Results  Component Value Date   CHOL 120 10/29/2019   Lab Results  Component Value Date   HDL 34 (L) 10/29/2019   Lab Results   Component Value Date   LDLCALC 55 10/29/2019   Lab Results  Component Value Date   TRIG 183 (H) 10/29/2019   Lab Results  Component Value Date   CHOLHDL 3.5 10/29/2019   Lab Results  Component Value Date   HGBA1C 6.6 (H) 10/29/2019      Assessment & Plan:   Problem List Items Addressed This Visit      Cardiovascular and  Mediastinum   Essential hypertension - Primary   Relevant Medications   amLODipine (NORVASC) 10 MG tablet   hydrochlorothiazide (HYDRODIURIL) 25 MG tablet   rosuvastatin (CRESTOR) 20 MG tablet   Other Relevant Orders   CBC with Differential (Completed)   Comprehensive metabolic panel (Completed)     Other   Dyslipidemia   Relevant Medications   rosuvastatin (CRESTOR) 20 MG tablet   Other Relevant Orders   Lipid panel (Completed)    Other Visit Diagnoses    Screening for endocrine, metabolic and immunity disorder       Relevant Orders   TSH (Completed)   Hemoglobin A1c (Completed)   Screening for viral disease       Relevant Orders   Hepatitis C antibody (Completed)   History of elevated glucose       Relevant Orders   Hemoglobin A1c (Completed)      Meds ordered this encounter  Medications  . amLODipine (NORVASC) 10 MG tablet    Sig: Take 1 tablet (10 mg total) by mouth daily.    Dispense:  90 tablet    Refill:  3    Order Specific Question:   Supervising Provider    Answer:   Delia Chimes A [id number]  . hydrochlorothiazide (HYDRODIURIL) 25 MG tablet    Sig: Take 1 tablet (25 mg total) by mouth daily.    Dispense:  90 tablet    Refill:  3    Order Specific Question:   Supervising Provider    Answer:   Delia Chimes A [id number]  . rosuvastatin (CRESTOR) 20 MG tablet    Sig: Take 1 tablet (20 mg total) by mouth daily.    Dispense:  90 tablet    Refill:  3    Order Specific Question:   Supervising Provider    Answer:   Forrest Moron [id number]    Follow-up: No follow-ups on file.   PLAN  BP borderline today. Discussed  further lifestyle modifications he can take to make sure he avoids needing additional htn agents added  Labs collected, will follow up as warranted  Needs 6 mo follow up with myself or PCP Dr. Mitchel Honour.  Patient encouraged to call clinic with any questions, comments, or concerns.   David Coss, NP

## 2020-01-29 NOTE — Progress Notes (Signed)
[place]   [phone] [phone]   Clinic Follow up Note   Patient Care Team: Horald Pollen, MD as PCP - General (Internal Medicine) Leighton Ruff, MD as Consulting Physician (General Surgery)  Date of Service:  02/03/2020  CHIEF COMPLAINT: F/u of rectal cancer  SUMMARY OF ONCOLOGIC HISTORY: Oncology History Overview Note  Rectal cancer [facility])   Staging form: Colon and Rectum, AJCC 7th Edition   - Clinical stage from 12/09/2015: Stage IIA (T3, N0, M0) - Signed by Truitt Merle, MD on 12/22/2015   - Pathologic stage from 03/25/2016: Stage I (yT2, N0, cM0) - Signed by Truitt Merle, MD on 04/22/2016     Rectal cancer ([place])  11/23/2015 Initial Diagnosis   Rectal cancer ([place])   11/23/2015 Procedure   Colonoscopy showed a large fungating ulcerated partially obstructing mass in the proximal rectum. This was biopsied.   11/23/2015 Initial Biopsy   Rectal mass biopsy showed adenocarcinoma   11/25/2015 Imaging   CT chest, abdomen and pelvis with contrast showed focal wall thickening involving the low rectum, no findings suspicious for metastatic disease.   12/09/2015 Procedure   EUS showed a T3 N0 lesion in proximal rectum.   12/22/2015 - 02/01/2016 Radiation Therapy   New adjuvant irradiation to the rectal cancer   12/22/2015 - 02/01/2016 Chemotherapy   Neoadjuvant Xeloda 2000mg  in AM, and 1500mg  in PM with concurrent radiation    03/25/2016 Surgery   Robotic-assisted anterior resection of proximal rectal cancer   03/25/2016 Pathology Results   Rectal sigmoid segmental resection showed adenocarcinoma with muscularis propria and aubmucosa invasion, margins were negative, extensive fibrosis, 12 lymph nodes were negative.   05/04/2016 - 07/23/2016 Chemotherapy   Adjuvant Xeloda 2000mg  bid, 2 weeks on and one week off     12/15/2016 Imaging   CT CAP IMPRESSION: 1. Surgical changes from a prior rectal colon resection and anastomosis. No CT findings to  suggest recurrent tumor or metastatic disease. 2. Presacral soft tissue thickening is most likely postsurgical change and post radiation change. Attention on future scans is suggested. 3. No significant findings involving the chest. No evidence of pulmonary metastatic disease. Stable three-vessel coronary artery calcifications.   01/24/2018 Imaging   IMPRESSION: 1. No evidence of local recurrence or metastatic disease. Presacral fibrotic changes have mildly improved. 2. Stable small right lower quadrant abdominal wall hernia containing a knuckle of small bowel, possibly at previous ileostomy. 3. Coronary and Aortic Atherosclerosis (ICD10-I70.0). Bilateral L5 pars defects.    01/30/2019 Imaging   CT CAP W Contrast 01/30/19  IMPRESSION: 1. No evidence of local tumor recurrence or metastatic disease. 2. Aortic Atherosclerosis (ICD10-I70.0). Coronary artery calcifications. 3. Lumbar spondylosis with bilateral L5 pars defects and anterolisthesis of L5 on S1.        CURRENT THERAPY:  Surveillance   INTERVAL HISTORY:  David Jacobs is here for a follow up of rectal cancer. He was last seen by me 1 year ago. He presents to the clinic with his family member who helps translate for him. He notes he is doing well. He denies any new major changes. His diarrhea post prior treatment has resolved. He denies blood in stool. I reviewed his medication list with him.     REVIEW OF SYSTEMS:   Constitutional: Denies fevers, chills or abnormal weight loss Eyes: Denies blurriness of vision Ears, nose, mouth, throat, and face: Denies mucositis or sore throat Respiratory: Denies cough, dyspnea or wheezes Cardiovascular: Denies palpitation, chest discomfort or lower extremity  swelling Gastrointestinal:  Denies nausea, heartburn or change in bowel habits Skin: Denies abnormal skin rashes Lymphatics: Denies new lymphadenopathy or easy bruising Neurological:Denies numbness, tingling or new  weaknesses Behavioral/Psych: Mood is stable, no new changes  All other systems were reviewed with the patient and are negative.  MEDICAL HISTORY:  Past Medical History:  Diagnosis Date  . Hypertension     SURGICAL HISTORY: Past Surgical History:  Procedure Laterality Date  . EUS N/A 12/09/2015   Procedure: LOWER ENDOSCOPIC ULTRASOUND (EUS);  Surgeon: [name], MD;  Location: [facility];  Service: Endoscopy;  Laterality: N/A;  . XI ROBOTIC ASSISTED LOWER ANTERIOR RESECTION N/A 03/25/2016   Procedure: XI ROBOTIC ASSISTED LOWER ANTERIOR RESECTION;  Surgeon: Leighton Ruff, MD;  Location: [facility];  Service: General;  Laterality: N/A;    I have reviewed the social history and family history with the patient and they are unchanged from previous note.  ALLERGIES:  has No Known Allergies.  MEDICATIONS:  Current Outpatient Medications  Medication Sig Dispense Refill  . amLODipine (NORVASC) 5 MG tablet Take 5 mg by mouth daily.    . hydrochlorothiazide (HYDRODIURIL) 25 MG tablet Take 1 tablet (25 mg total) by mouth daily. 90 tablet 3  . rosuvastatin (CRESTOR) 20 MG tablet Take 1 tablet (20 mg total) by mouth daily. 90 tablet 3   No current facility-administered medications for this visit.    PHYSICAL EXAMINATION: ECOG PERFORMANCE STATUS: 0 - Asymptomatic  Vitals:   02/03/20 1023  BP: (!) 169/71  Pulse: 72  Resp: 20  Temp: 97.7 F (36.5 C)  SpO2: 97%   Filed Weights   02/03/20 1023  Weight: 212 lb 14.4 oz (96.6 kg)     GENERAL:alert, no distress and comfortable SKIN: skin color, texture, turgor are normal, no rashes or significant lesions (+) Stable skin lesion of anterior right lower leg EYES: normal, Conjunctiva are pink and non-injected, sclera clear  NECK: supple, thyroid normal size, non-tender, without nodularity LYMPH:  no palpable lymphadenopathy in the cervical, axillary  LUNGS: clear to auscultation and percussion with normal breathing effort HEART: regular  rate & rhythm and no murmurs and no lower extremity edema ABDOMEN:abdomen soft, non-tender and normal bowel sounds Musculoskeletal:no cyanosis of digits and no clubbing  NEURO: alert & oriented x 3 with fluent speech, no focal motor/sensory deficits  LABORATORY DATA:  I have reviewed the data as listed CBC Latest Ref Rng & Units 02/03/2020 10/29/2019 01/30/2019  WBC 4.0 - 10.5 K/uL 10.2 8.4 8.8  Hemoglobin 13.0 - 17.0 g/dL 16.4 16.2 16.2  Hematocrit 39 - 52 % 47.1 47.8 47.6  Platelets 150 - 400 K/uL 264 279 321     CMP Latest Ref Rng & Units 02/03/2020 10/29/2019 01/30/2019  Glucose 70 - 99 mg/dL 123(H) 130(H) 116(H)  BUN 8 - 23 mg/dL 17 18 16   Creatinine 0.61 - 1.24 mg/dL 1.07 0.99 1.19  Sodium 135 - 145 mmol/L 139 139 138  Potassium 3.5 - 5.1 mmol/L 4.1 4.4 3.9  Chloride 98 - 111 mmol/L 107 101 102  CO2 22 - 32 mmol/L 22 22 27   Calcium 8.9 - 10.3 mg/dL 9.6 9.6 9.6  Total Protein 6.5 - 8.1 g/dL 7.4 6.9 7.3  Total Bilirubin 0.3 - 1.2 mg/dL 0.4 0.4 0.5  Alkaline Phos 38 - 126 U/L 61 70 60  AST 15 - 41 U/L 19 20 14(L)  ALT 0 - 44 U/L 23 23 15       RADIOGRAPHIC STUDIES: I have personally reviewed  the radiological images as listed and agreed with the findings in the report. No results found.   ASSESSMENT & PLAN:  Alecxander Mainwaring is a 69 y.o. male with    1. Proximal rectal adenocarcinoma, uT3N0M0, stage IIA, ypT2N0M0 -He was diagnosed in 11/2015. He is s/p neoadjuvant concurrent chemoradiation with Xeloda, surgical resection and adjuvant Xeloda. He is now on surveillance.  -He is clinically doing well. Labs reviewed, CBC and CMP WNL. Physical exam unremarkable. There is no clinical concern for recurrence.  -He is due for colonoscopy with Dr Paulita Fujita in 03/2020. He will continue Surveillance. He is 4 years since cancer diagnosis and his risk of recurrence has significantly decreased. I do not plan to do more surveillance scans at this ponit.  -F/u in 1 year to complete 5 year surveillance  plan.    2. HTN -ContinueAmlodipine, Hyzaar and Benicar.  -Continue to follow up with primary care physician -BP at 169/71 today (02/03/20)   3. Smoking and alcohol cessation  -I also encouraged him to drink alcohol lessandstrongly encouraged him to cut back and [place] not interested in smoking cessationalthough he is aware of the smoking related health issues    Plan -He is clinically doing well  -Lab and F/u in 1 year.  -Colonoscopy with Dr Paulita Fujita in 03/2020    No problem-specific [place] notes found for this encounter.   No orders of the defined types were placed in this encounter.  All questions were answered. The patient knows to call the clinic with any problems, questions or concerns. No barriers to learning was detected. The total time spent in the appointment was 25 minutes.     Truitt Merle, MD 02/03/2020   I, Joslyn Devon, am acting as scribe for Truitt Merle, MD.   I have reviewed the above documentation for accuracy and completeness, and I agree with the above.

## 2020-02-03 ENCOUNTER — Encounter: Payer: Self-pay | Admitting: Hematology

## 2020-02-03 ENCOUNTER — Other Ambulatory Visit: Payer: Self-pay

## 2020-02-03 ENCOUNTER — Inpatient Hospital Stay: Payer: Medicare Other

## 2020-02-03 ENCOUNTER — Inpatient Hospital Stay: Payer: Medicare Other | Attending: Hematology | Admitting: Hematology

## 2020-02-03 ENCOUNTER — Telehealth: Payer: Self-pay | Admitting: Hematology

## 2020-02-03 VITALS — BP 169/71 | HR 72 | Temp 97.7°F | Resp 20 | Wt 212.9 lb

## 2020-02-03 DIAGNOSIS — C20 Malignant neoplasm of rectum: Secondary | ICD-10-CM

## 2020-02-03 DIAGNOSIS — I10 Essential (primary) hypertension: Secondary | ICD-10-CM | POA: Insufficient documentation

## 2020-02-03 LAB — CBC WITH DIFFERENTIAL/PLATELET
Abs Immature Granulocytes: 0.05 10*3/uL (ref 0.00–0.07)
Basophils Absolute: 0.1 10*3/uL (ref 0.0–0.1)
Basophils Relative: 1 %
Eosinophils Absolute: 0.3 10*3/uL (ref 0.0–0.5)
Eosinophils Relative: 3 %
HCT: 47.1 % (ref 39.0–52.0)
Hemoglobin: 16.4 g/dL (ref 13.0–17.0)
Immature Granulocytes: 1 %
Lymphocytes Relative: 31 %
Lymphs Abs: 3.1 10*3/uL (ref 0.7–4.0)
MCH: 31.1 pg (ref 26.0–34.0)
MCHC: 34.8 g/dL (ref 30.0–36.0)
MCV: 89.2 fL (ref 80.0–100.0)
Monocytes Absolute: 0.7 10*3/uL (ref 0.1–1.0)
Monocytes Relative: 6 %
Neutro Abs: 6 10*3/uL (ref 1.7–7.7)
Neutrophils Relative %: 58 %
Platelets: 264 10*3/uL (ref 150–400)
RBC: 5.28 MIL/uL (ref 4.22–5.81)
RDW: 13 % (ref 11.5–15.5)
WBC: 10.2 10*3/uL (ref 4.0–10.5)
nRBC: 0 % (ref 0.0–0.2)

## 2020-02-03 LAB — COMPREHENSIVE METABOLIC PANEL
ALT: 23 U/L (ref 0–44)
AST: 19 U/L (ref 15–41)
Albumin: 4 g/dL (ref 3.5–5.0)
Alkaline Phosphatase: 61 U/L (ref 38–126)
Anion gap: 10 (ref 5–15)
BUN: 17 mg/dL (ref 8–23)
CO2: 22 mmol/L (ref 22–32)
Calcium: 9.6 mg/dL (ref 8.9–10.3)
Chloride: 107 mmol/L (ref 98–111)
Creatinine, Ser: 1.07 mg/dL (ref 0.61–1.24)
GFR calc Af Amer: >60 mL/min (ref >60)
GFR calc non Af Amer: >60 mL/min (ref >60)
Glucose, Bld: 123 mg/dL — ABNORMAL HIGH (ref 70–99)
Potassium: 4.1 mmol/L (ref 3.5–5.1)
Sodium: 139 mmol/L (ref 135–145)
Total Bilirubin: 0.4 mg/dL (ref 0.3–1.2)
Total Protein: 7.4 g/dL (ref 6.5–8.1)

## 2020-02-03 NOTE — Telephone Encounter (Signed)
Scheduled per los. Gave avs and calendar  

## 2020-02-04 ENCOUNTER — Encounter: Payer: Self-pay | Admitting: Hematology

## 2020-04-18 ENCOUNTER — Other Ambulatory Visit: Payer: Self-pay | Admitting: Emergency Medicine

## 2020-04-18 DIAGNOSIS — I10 Essential (primary) hypertension: Secondary | ICD-10-CM

## 2020-04-18 DIAGNOSIS — E785 Hyperlipidemia, unspecified: Secondary | ICD-10-CM

## 2020-04-27 ENCOUNTER — Ambulatory Visit (INDEPENDENT_AMBULATORY_CARE_PROVIDER_SITE_OTHER): Payer: Medicare HMO | Admitting: Emergency Medicine

## 2020-04-27 ENCOUNTER — Other Ambulatory Visit: Payer: Self-pay

## 2020-04-27 ENCOUNTER — Encounter: Payer: Self-pay | Admitting: Emergency Medicine

## 2020-04-27 VITALS — BP 154/74 | HR 70 | Temp 98.3°F | Resp 18 | Ht 70.0 in | Wt 211.0 lb

## 2020-04-27 DIAGNOSIS — Z23 Encounter for immunization: Secondary | ICD-10-CM

## 2020-04-27 DIAGNOSIS — E785 Hyperlipidemia, unspecified: Secondary | ICD-10-CM

## 2020-04-27 DIAGNOSIS — R7303 Prediabetes: Secondary | ICD-10-CM | POA: Diagnosis not present

## 2020-04-27 DIAGNOSIS — Z6830 Body mass index (BMI) 30.0-30.9, adult: Secondary | ICD-10-CM | POA: Diagnosis not present

## 2020-04-27 DIAGNOSIS — I700 Atherosclerosis of aorta: Secondary | ICD-10-CM

## 2020-04-27 DIAGNOSIS — I10 Essential (primary) hypertension: Secondary | ICD-10-CM

## 2020-04-27 MED ORDER — ROSUVASTATIN CALCIUM 20 MG PO TABS: 20.0000 mg | ORAL_TABLET | Freq: Every day | ORAL | 3 refills | Status: DC

## 2020-04-27 MED ORDER — AMLODIPINE BESYLATE 5 MG PO TABS: 5.0000 mg | ORAL_TABLET | Freq: Every day | ORAL | 3 refills | Status: DC

## 2020-04-27 MED ORDER — HYDROCHLOROTHIAZIDE 25 MG PO TABS: 25.0000 mg | ORAL_TABLET | Freq: Every day | ORAL | 3 refills | Status: DC

## 2020-04-27 NOTE — Progress Notes (Signed)
David Jacobs 69 y.o.   Chief Complaint  Patient presents with  . Follow-up    Follow up on Hypertension and Medication refills on all medications. Patient has no questions or concerns.    HISTORY OF PRESENT ILLNESS: This is a 69 y.o. male with history of hypertension and dyslipidemia here for follow-up and medication refill. Has no complaints or medical concerns today. Presently on amlodipine, hydrochlorothiazide, and rosuvastatin. Most recent blood work results reviewed and discussed with patient's.  Patient has history of prediabetes with borderline diabetes showing up in latest results.  Triglycerides slightly elevated.  HPI   Prior to Admission medications   Medication Sig Start Date End Date Taking? Authorizing Provider  amLODipine (NORVASC) 5 MG tablet Take 1 tablet (5 mg total) by mouth daily. 04/27/20  Yes Jianni Batten, Ines Bloomer, MD  hydrochlorothiazide (HYDRODIURIL) 25 MG tablet Take 1 tablet (25 mg total) by mouth daily. 04/27/20  Yes Cordelro Gautreau, Ines Bloomer, MD  rosuvastatin (CRESTOR) 20 MG tablet Take 1 tablet (20 mg total) by mouth daily. 04/27/20 07/26/20 Yes SagardiaInes Bloomer, MD    No Known Allergies  Patient Active Problem List   Diagnosis Date Noted  . Dyslipidemia 03/30/2018  . Prediabetes 03/30/2018  . Rectal cancer ([place]) 12/07/2015  . Essential hypertension 11/15/2015    Past Medical History:  Diagnosis Date  . Hypertension     Past Surgical History:  Procedure Laterality Date  . EUS N/A 12/09/2015   Procedure: LOWER ENDOSCOPIC ULTRASOUND (EUS);  Surgeon: [name], MD;  Location: [facility];  Service: Endoscopy;  Laterality: N/A;  . XI ROBOTIC ASSISTED LOWER ANTERIOR RESECTION N/A 03/25/2016   Procedure: XI ROBOTIC ASSISTED LOWER ANTERIOR RESECTION;  Surgeon: Leighton Ruff, MD;  Location: [facility];  Service: General;  Laterality: N/A;    Social History   Socioeconomic History  . Marital status: Married    Spouse name: Not on file  . Number of  children: 2  . Years of education: Not on file  . Highest education level: Not on file  Occupational History  . Not on file  Tobacco Use  . Smoking status: Current Every Day Smoker    Packs/day: 0.50    Years: 35.00    Pack years: 17.50  . Smokeless tobacco: Never Used  Substance and Sexual Activity  . Alcohol use: No    Alcohol/week: 2.0 - 3.0 standard drinks    Types: 2 - 3 Cans of beer per week  . Drug use: No  . Sexual activity: Not on file  Other Topics Concern  . Not on file  Social History Narrative   Married, lives w/his wife and a son--minimal English   Works full time at [employer]   Current everyday smoker, with no plans to quit   Primary contact is daughter-in-law, Olusegun Gerstenberger (can interpret for him)   Social Determinants of Health   Financial Resource Strain:   . Difficulty of Paying Living Expenses: Not on file  Food Insecurity:   . Worried About Charity fundraiser in the Last Year: Not on file  . Ran Out of Food in the Last Year: Not on file  Transportation Needs:   . Lack of Transportation (Medical): Not on file  . Lack of Transportation (Non-Medical): Not on file  Physical Activity:   . Days of Exercise per Week: Not on file  . Minutes of Exercise per Session: Not on file  Stress:   . Feeling of Stress : Not on file  Social  Connections:   . Frequency of Communication with Friends and Family: Not on file  . Frequency of Social Gatherings with Friends and Family: Not on file  . Attends Religious Services: Not on file  . Active Member of Clubs or Organizations: Not on file  . Attends Archivist Meetings: Not on file  . Marital Status: Not on file  Intimate Partner Violence:   . Fear of Current or Ex-Partner: Not on file  . Emotionally Abused: Not on file  . Physically Abused: Not on file  . Sexually Abused: Not on file    Family History  Problem Relation Age of Onset  . Cancer Mother 78       throid cancer   .  Hyperlipidemia Mother   . Diabetes Father      Review of Systems  Constitutional: Negative.  Negative for chills and fever.  HENT: Negative.  Negative for congestion and sore throat.   Respiratory: Negative.  Negative for cough and shortness of breath.   Cardiovascular: Negative.  Negative for chest pain and palpitations.  Gastrointestinal: Negative.  Negative for abdominal pain, diarrhea, nausea and vomiting.  Genitourinary: Negative.  Negative for dysuria and hematuria.  Musculoskeletal: Negative.  Negative for back pain, myalgias and neck pain.  Skin: Negative.  Negative for rash.  Neurological: Negative for dizziness and headaches.  All other systems reviewed and are negative.    Today's Vitals   04/27/20 1031  BP: (!) 154/74  Pulse: 70  Resp: 18  Temp: 98.3 F (36.8 C)  TempSrc: Temporal  SpO2: 97%  Weight: 211 lb (95.7 kg)  Height: 5\' 10"  (1.778 m)   Body mass index is 30.28 kg/m. BP Readings from Last 3 Encounters:  04/27/20 (!) 154/74  02/03/20 (!) 169/71  10/29/19 140/78   Wt Readings from Last 3 Encounters:  04/27/20 211 lb (95.7 kg)  02/03/20 212 lb 14.4 oz (96.6 kg)  10/29/19 209 lb 12.8 oz (95.2 kg)     Physical Exam Vitals reviewed.  Constitutional:      Appearance: Normal appearance.  HENT:     Head: Normocephalic.  Eyes:     Extraocular Movements: Extraocular movements intact.     Conjunctiva/sclera: Conjunctivae normal.     Pupils: Pupils are equal, round, and reactive to light.  Cardiovascular:     Rate and Rhythm: Normal rate and regular rhythm.     Pulses: Normal pulses.     Heart sounds: Normal heart sounds.  Pulmonary:     Effort: Pulmonary effort is normal.     Breath sounds: Normal breath sounds.  Abdominal:     General: There is no distension.     Palpations: Abdomen is soft.     Tenderness: There is no abdominal tenderness.  Musculoskeletal:        General: Normal range of motion.     Cervical back: Normal range of motion  and neck supple.  Skin:    General: Skin is warm and dry.     Capillary Refill: Capillary refill takes less than 2 seconds.  Neurological:     General: No focal deficit present.     Mental Status: He is alert and oriented to person, place, and time.  Psychiatric:        Mood and Affect: Mood normal.        Behavior: Behavior normal.    A total of 30 minutes was spent with the patient and daughter who is helping with translation, greater than 50% of  which was in counseling/coordination of care regarding hypertension, diabetes, dyslipidemia, and cardiovascular risks associated with these conditions, review of all medications, review of most recent blood work results, review of most recent office visit notes, diet and nutrition, health maintenance items, documentation, prognosis and need for follow-up.   ASSESSMENT & PLAN: Clinically stable.  No medical concerns identified during this visit.  Continue present medications.  No changes. Zachary was seen today for follow-up.  Diagnoses and all orders for this visit:  Essential hypertension -     Comprehensive metabolic panel -     amLODipine (NORVASC) 5 MG tablet; Take 1 tablet (5 mg total) by mouth daily. -     hydrochlorothiazide (HYDRODIURIL) 25 MG tablet; Take 1 tablet (25 mg total) by mouth daily.  Flu vaccine need -     Flu Vaccine QUAD High Dose(Fluad)  Dyslipidemia -     Lipid panel -     rosuvastatin (CRESTOR) 20 MG tablet; Take 1 tablet (20 mg total) by mouth daily.  Prediabetes -     Hemoglobin A1c  Atherosclerosis of aorta (HCC)  Body mass index (BMI) of 30.0-30.9 in adult    Patient Instructions       If you have lab work done today you will be contacted with your lab results within the next 2 weeks.  If you have not heard from [place] then please contact us. The fastest way to get your results is to register for My Chart.   IF you received an x-ray today, you will receive an invoice from [facility].  Please contact [facility] at [phone] with questions or concerns regarding your invoice.   IF you received labwork today, you will receive an invoice from [facility]. Please contact [facility] at [phone] with questions or concerns regarding your invoice.   Our billing staff will not be able to assist you with questions regarding bills from these companies.  You will be contacted with the lab results as soon as they are available. The fastest way to get your results is to activate your My Chart account. Instructions are located on the last page of this paperwork. If you have not heard from [place] regarding the results in 2 weeks, please contact this office.      Health Maintenance After Age 22 After age 64, you are at a higher risk for certain long-term diseases and infections as well as injuries from falls. Falls are a major cause of broken bones and head injuries in people who are older than age 60. Getting regular preventive care can help to keep you healthy and well. Preventive care includes getting regular testing and making lifestyle changes as recommended by your health care provider. Talk with your health care provider about:  Which screenings and tests you should have. A screening is a test that checks for a disease when you have no symptoms.  A diet and exercise plan that is right for you. What should I know about screenings and tests to prevent falls? Screening and testing are the best ways to find a health problem early. Early diagnosis and treatment give you the best chance of managing medical conditions that are common after age 66. Certain conditions and lifestyle choices may make you more likely to have a fall. Your health care provider may recommend:  Regular vision checks. Poor vision and conditions such as cataracts can make you more likely to have a fall. If you wear glasses, make sure to get your prescription updated if your  vision changes.  Medicine review. Work  with your health care provider to regularly review all of the medicines you are taking, including over-the-counter medicines. Ask your health care provider about any side effects that may make you more likely to have a fall. Tell your health care provider if any medicines that you take make you feel dizzy or sleepy.  Osteoporosis screening. Osteoporosis is a condition that causes the bones to get weaker. This can make the bones weak and cause them to break more easily.  Blood pressure screening. Blood pressure changes and medicines to control blood pressure can make you feel dizzy.  Strength and balance checks. Your health care provider may recommend certain tests to check your strength and balance while standing, walking, or changing positions.  Foot health exam. Foot pain and numbness, as well as not wearing proper footwear, can make you more likely to have a fall.  Depression screening. You may be more likely to have a fall if you have a fear of falling, feel emotionally low, or feel unable to do activities that you used to do.  Alcohol use screening. Using too much alcohol can affect your balance and may make you more likely to have a fall. What actions can I take to lower my risk of falls? General instructions  Talk with your health care provider about your risks for falling. Tell your health care provider if: ? You fall. Be sure to tell your health care provider about all falls, even ones that seem minor. ? You feel dizzy, sleepy, or off-balance.  Take over-the-counter and prescription medicines only as told by your health care provider. These include any supplements.  Eat a healthy diet and maintain a healthy weight. A healthy diet includes low-fat dairy products, low-fat (lean) meats, and fiber from whole grains, beans, and lots of fruits and vegetables. Home safety  Remove any tripping hazards, such as rugs, cords, and clutter.  Install safety equipment such as grab bars in  bathrooms and safety rails on stairs.  Keep rooms and walkways well-lit. Activity   Follow a regular exercise program to stay fit. This will help you maintain your balance. Ask your health care provider what types of exercise are appropriate for you.  If you need a cane or walker, use it as recommended by your health care provider.  Wear supportive shoes that have nonskid soles. Lifestyle  Do not drink alcohol if your health care provider tells you not to drink.  If you drink alcohol, limit how much you have: ? 0-1 drink a day for women. ? 0-2 drinks a day for men.  Be aware of how much alcohol is in your drink. In the [place] one drink equals one typical bottle of beer (12 oz), one-half glass of wine (5 oz), or one shot of hard liquor (1 oz).  Do not use any products that contain nicotine or tobacco, such as cigarettes and e-cigarettes. If you need help quitting, ask your health care provider. Summary  Having a healthy lifestyle and getting preventive care can help to protect your health and wellness after age 34.  Screening and testing are the best way to find a health problem early and help you avoid having a fall. Early diagnosis and treatment give you the best chance for managing medical conditions that are more common for people who are older than age 70.  Falls are a major cause of broken bones and head injuries in people who are older than age 62. Take  precautions to prevent a fall at home.  Work with your health care provider to learn what changes you can make to improve your health and wellness and to prevent falls. This information is not intended to replace advice given to you by your health care provider. Make sure you discuss any questions you have with your health care provider. Document Revised: 11/01/2018 Document Reviewed: 05/24/2017 Elsevier Patient Education  2020 [employer]vier Inc.     Agustina Caroli, MD [facility]

## 2020-04-27 NOTE — Patient Instructions (Addendum)
   If you have lab work done today you will be contacted with your lab results within the next 2 weeks.  If you have not heard from us then please contact us. The fastest way to get your results is to register for My Chart.   IF you received an x-ray today, you will receive an invoice from [facility]. Please contact  Radiology at [phone] with questions or concerns regarding your invoice.   IF you received labwork today, you will receive an invoice from [facility]. Please contact [facility] at [phone] with questions or concerns regarding your invoice.   Our billing staff will not be able to assist you with questions regarding bills from these companies.  You will be contacted with the lab results as soon as they are available. The fastest way to get your results is to activate your My Chart account. Instructions are located on the last page of this paperwork. If you have not heard from us regarding the results in 2 weeks, please contact this office.     Health Maintenance After Age 65 After age 65, you are at a higher risk for certain long-term diseases and infections as well as injuries from falls. Falls are a major cause of broken bones and head injuries in people who are older than age 65. Getting regular preventive care can help to keep you healthy and well. Preventive care includes getting regular testing and making lifestyle changes as recommended by your health care provider. Talk with your health care provider about:  Which screenings and tests you should have. A screening is a test that checks for a disease when you have no symptoms.  A diet and exercise plan that is right for you. What should I know about screenings and tests to prevent falls? Screening and testing are the best ways to find a health problem early. Early diagnosis and treatment give you the best chance of managing medical conditions that are common after age 65. Certain conditions and  lifestyle choices may make you more likely to have a fall. Your health care provider may recommend:  Regular vision checks. Poor vision and conditions such as cataracts can make you more likely to have a fall. If you wear glasses, make sure to get your prescription updated if your vision changes.  Medicine review. Work with your health care provider to regularly review all of the medicines you are taking, including over-the-counter medicines. Ask your health care provider about any side effects that may make you more likely to have a fall. Tell your health care provider if any medicines that you take make you feel dizzy or sleepy.  Osteoporosis screening. Osteoporosis is a condition that causes the bones to get weaker. This can make the bones weak and cause them to break more easily.  Blood pressure screening. Blood pressure changes and medicines to control blood pressure can make you feel dizzy.  Strength and balance checks. Your health care provider may recommend certain tests to check your strength and balance while standing, walking, or changing positions.  Foot health exam. Foot pain and numbness, as well as not wearing proper footwear, can make you more likely to have a fall.  Depression screening. You may be more likely to have a fall if you have a fear of falling, feel emotionally low, or feel unable to do activities that you used to do.  Alcohol use screening. Using too much alcohol can affect your balance and may make you more likely to   have a fall. What actions can I take to lower my risk of falls? General instructions  Talk with your health care provider about your risks for falling. Tell your health care provider if: ? You fall. Be sure to tell your health care provider about all falls, even ones that seem minor. ? You feel dizzy, sleepy, or off-balance.  Take over-the-counter and prescription medicines only as told by your health care provider. These include any  supplements.  Eat a healthy diet and maintain a healthy weight. A healthy diet includes low-fat dairy products, low-fat (lean) meats, and fiber from whole grains, beans, and lots of fruits and vegetables. Home safety  Remove any tripping hazards, such as rugs, cords, and clutter.  Install safety equipment such as grab bars in bathrooms and safety rails on stairs.  Keep rooms and walkways well-lit. Activity   Follow a regular exercise program to stay fit. This will help you maintain your balance. Ask your health care provider what types of exercise are appropriate for you.  If you need a cane or walker, use it as recommended by your health care provider.  Wear supportive shoes that have nonskid soles. Lifestyle  Do not drink alcohol if your health care provider tells you not to drink.  If you drink alcohol, limit how much you have: ? 0-1 drink a day for women. ? 0-2 drinks a day for men.  Be aware of how much alcohol is in your drink. In the [place] one drink equals one typical bottle of beer (12 oz), one-half glass of wine (5 oz), or one shot of hard liquor (1 oz).  Do not use any products that contain nicotine or tobacco, such as cigarettes and e-cigarettes. If you need help quitting, ask your health care provider. Summary  Having a healthy lifestyle and getting preventive care can help to protect your health and wellness after age 65.  Screening and testing are the best way to find a health problem early and help you avoid having a fall. Early diagnosis and treatment give you the best chance for managing medical conditions that are more common for people who are older than age 65.  Falls are a major cause of broken bones and head injuries in people who are older than age 65. Take precautions to prevent a fall at home.  Work with your health care provider to learn what changes you can make to improve your health and wellness and to prevent falls. This information is not intended  to replace advice given to you by your health care provider. Make sure you discuss any questions you have with your health care provider. Document Revised: 11/01/2018 Document Reviewed: 05/24/2017 Elsevier Patient Education  2020 [employer]  

## 2020-04-28 LAB — LIPID PANEL
Chol/HDL Ratio: 2.9 ratio (ref 0.0–5.0)
Cholesterol, Total: 123 mg/dL (ref 100–199)
HDL: 42 mg/dL (ref >39)
LDL Chol Calc (NIH): 60 mg/dL (ref 0–99)
Triglycerides: 115 mg/dL (ref 0–149)
VLDL Cholesterol Cal: 21 mg/dL (ref 5–40)

## 2020-04-28 LAB — COMPREHENSIVE METABOLIC PANEL
ALT: 24 IU/L (ref 0–44)
AST: 16 IU/L (ref 0–40)
Albumin/Globulin Ratio: 2 (ref 1.2–2.2)
Albumin: 4.7 g/dL (ref 3.8–4.8)
Alkaline Phosphatase: 67 IU/L (ref 44–121)
BUN/Creatinine Ratio: 15 (ref 10–24)
BUN: 14 mg/dL (ref 8–27)
Bilirubin Total: 0.3 mg/dL (ref 0.0–1.2)
CO2: 25 mmol/L (ref 20–29)
Calcium: 9.6 mg/dL (ref 8.6–10.2)
Chloride: 101 mmol/L (ref 96–106)
Creatinine, Ser: 0.96 mg/dL (ref 0.76–1.27)
GFR calc Af Amer: 92 mL/min/{1.73_m2} (ref >59)
GFR calc non Af Amer: 80 mL/min/{1.73_m2} (ref >59)
Globulin, Total: 2.4 g/dL (ref 1.5–4.5)
Glucose: 125 mg/dL — ABNORMAL HIGH (ref 65–99)
Potassium: 4.6 mmol/L (ref 3.5–5.2)
Sodium: 140 mmol/L (ref 134–144)
Total Protein: 7.1 g/dL (ref 6.0–8.5)

## 2020-04-28 LAB — HEMOGLOBIN A1C
Est. average glucose Bld gHb Est-mCnc: 148 mg/dL
Hgb A1c MFr Bld: 6.8 % — ABNORMAL HIGH (ref 4.8–5.6)

## 2020-05-26 DIAGNOSIS — Z1159 Encounter for screening for other viral diseases: Secondary | ICD-10-CM | POA: Diagnosis not present

## 2020-05-29 DIAGNOSIS — K5520 Angiodysplasia of colon without hemorrhage: Secondary | ICD-10-CM | POA: Diagnosis not present

## 2020-05-29 DIAGNOSIS — Z85038 Personal history of other malignant neoplasm of large intestine: Secondary | ICD-10-CM | POA: Diagnosis not present

## 2020-06-10 DIAGNOSIS — Z809 Family history of malignant neoplasm, unspecified: Secondary | ICD-10-CM | POA: Diagnosis not present

## 2020-06-10 DIAGNOSIS — Z720 Tobacco use: Secondary | ICD-10-CM | POA: Diagnosis not present

## 2020-06-10 DIAGNOSIS — Z85038 Personal history of other malignant neoplasm of large intestine: Secondary | ICD-10-CM | POA: Diagnosis not present

## 2020-06-10 DIAGNOSIS — Z8249 Family history of ischemic heart disease and other diseases of the circulatory system: Secondary | ICD-10-CM | POA: Diagnosis not present

## 2020-06-10 DIAGNOSIS — K08109 Complete loss of teeth, unspecified cause, unspecified class: Secondary | ICD-10-CM | POA: Diagnosis not present

## 2020-06-10 DIAGNOSIS — E785 Hyperlipidemia, unspecified: Secondary | ICD-10-CM | POA: Diagnosis not present

## 2020-06-10 DIAGNOSIS — I10 Essential (primary) hypertension: Secondary | ICD-10-CM | POA: Diagnosis not present

## 2020-09-15 ENCOUNTER — Telehealth: Payer: Self-pay | Admitting: Nurse Practitioner

## 2020-09-15 NOTE — Telephone Encounter (Signed)
Rescheduled upcoming appointment due to provider's template and patient's request. Patient's daughter is aware of changes.

## 2020-10-26 ENCOUNTER — Ambulatory Visit: Payer: Self-pay | Admitting: Emergency Medicine

## 2020-12-18 ENCOUNTER — Other Ambulatory Visit: Payer: Self-pay

## 2020-12-18 DIAGNOSIS — C20 Malignant neoplasm of rectum: Secondary | ICD-10-CM

## 2020-12-22 ENCOUNTER — Other Ambulatory Visit: Payer: Self-pay | Admitting: Nurse Practitioner

## 2020-12-23 ENCOUNTER — Inpatient Hospital Stay: Payer: Medicare HMO | Admitting: Nurse Practitioner

## 2020-12-23 ENCOUNTER — Inpatient Hospital Stay: Payer: Medicare HMO

## 2020-12-24 ENCOUNTER — Telehealth: Payer: Self-pay | Admitting: Nurse Practitioner

## 2020-12-24 NOTE — Telephone Encounter (Signed)
R/s appts per 6/1 sch msg. Spoke to pt's daughter who is aware of appts date and times.

## 2020-12-29 ENCOUNTER — Inpatient Hospital Stay: Payer: Medicare HMO | Attending: Nurse Practitioner

## 2020-12-29 ENCOUNTER — Encounter: Payer: Self-pay | Admitting: Nurse Practitioner

## 2020-12-29 ENCOUNTER — Inpatient Hospital Stay: Payer: Medicare HMO | Admitting: Nurse Practitioner

## 2020-12-29 ENCOUNTER — Other Ambulatory Visit: Payer: Self-pay

## 2020-12-29 VITALS — BP 165/81 | HR 66 | Temp 97.3°F | Resp 18 | Wt 214.7 lb

## 2020-12-29 DIAGNOSIS — Z923 Personal history of irradiation: Secondary | ICD-10-CM | POA: Insufficient documentation

## 2020-12-29 DIAGNOSIS — R69 Illness, unspecified: Secondary | ICD-10-CM | POA: Diagnosis not present

## 2020-12-29 DIAGNOSIS — C20 Malignant neoplasm of rectum: Secondary | ICD-10-CM | POA: Diagnosis not present

## 2020-12-29 DIAGNOSIS — Z85048 Personal history of other malignant neoplasm of rectum, rectosigmoid junction, and anus: Secondary | ICD-10-CM | POA: Insufficient documentation

## 2020-12-29 DIAGNOSIS — Z9221 Personal history of antineoplastic chemotherapy: Secondary | ICD-10-CM | POA: Insufficient documentation

## 2020-12-29 DIAGNOSIS — F17210 Nicotine dependence, cigarettes, uncomplicated: Secondary | ICD-10-CM | POA: Diagnosis not present

## 2020-12-29 DIAGNOSIS — Z79899 Other long term (current) drug therapy: Secondary | ICD-10-CM | POA: Insufficient documentation

## 2020-12-29 DIAGNOSIS — I10 Essential (primary) hypertension: Secondary | ICD-10-CM | POA: Diagnosis not present

## 2020-12-29 LAB — CBC WITH DIFFERENTIAL (CANCER CENTER ONLY)
Abs Immature Granulocytes: 0.04 10*3/uL (ref 0.00–0.07)
Basophils Absolute: 0 10*3/uL (ref 0.0–0.1)
Basophils Relative: 0 %
Eosinophils Absolute: 0.4 10*3/uL (ref 0.0–0.5)
Eosinophils Relative: 4 %
HCT: 44.2 % (ref 39.0–52.0)
Hemoglobin: 15.6 g/dL (ref 13.0–17.0)
Immature Granulocytes: 0 %
Lymphocytes Relative: 31 %
Lymphs Abs: 2.8 10*3/uL (ref 0.7–4.0)
MCH: 31.8 pg (ref 26.0–34.0)
MCHC: 35.3 g/dL (ref 30.0–36.0)
MCV: 90.2 fL (ref 80.0–100.0)
Monocytes Absolute: 0.5 10*3/uL (ref 0.1–1.0)
Monocytes Relative: 6 %
Neutro Abs: 5.3 10*3/uL (ref 1.7–7.7)
Neutrophils Relative %: 59 %
Platelet Count: 270 10*3/uL (ref 150–400)
RBC: 4.9 MIL/uL (ref 4.22–5.81)
RDW: 13.1 % (ref 11.5–15.5)
WBC Count: 9 10*3/uL (ref 4.0–10.5)
nRBC: 0 % (ref 0.0–0.2)

## 2020-12-29 LAB — CMP (CANCER CENTER ONLY)
ALT: 18 U/L (ref 0–44)
AST: 14 U/L — ABNORMAL LOW (ref 15–41)
Albumin: 4.1 g/dL (ref 3.5–5.0)
Alkaline Phosphatase: 53 U/L (ref 38–126)
Anion gap: 11 (ref 5–15)
BUN: 16 mg/dL (ref 8–23)
CO2: 26 mmol/L (ref 22–32)
Calcium: 9.4 mg/dL (ref 8.9–10.3)
Chloride: 104 mmol/L (ref 98–111)
Creatinine: 1.01 mg/dL (ref 0.61–1.24)
GFR, Estimated: >60 mL/min (ref >60)
Glucose, Bld: 138 mg/dL — ABNORMAL HIGH (ref 70–99)
Potassium: 4.1 mmol/L (ref 3.5–5.1)
Sodium: 141 mmol/L (ref 135–145)
Total Bilirubin: 0.5 mg/dL (ref 0.3–1.2)
Total Protein: 7.1 g/dL (ref 6.5–8.1)

## 2020-12-29 NOTE — Progress Notes (Signed)
[place]   [phone] [phone]   Clinic Follow up Note   Patient Care Team: Horald Pollen, MD as PCP - General (Internal Medicine) Leighton Ruff, MD as Consulting Physician (General Surgery) [name], MD as Consulting Physician (Gastroenterology) 12/29/2020  CHIEF COMPLAINT: Follow up rectal cancer   SUMMARY OF ONCOLOGIC HISTORY: Oncology History Overview Note  Rectal cancer [facility])   Staging form: Colon and Rectum, AJCC 7th Edition   - Clinical stage from 12/09/2015: Stage IIA (T3, N0, M0) - Signed by Truitt Merle, MD on 12/22/2015   - Pathologic stage from 03/25/2016: Stage I (yT2, N0, cM0) - Signed by Truitt Merle, MD on 04/22/2016     Rectal cancer ([place])  11/23/2015 Initial Diagnosis   Rectal cancer ([place])   11/23/2015 Procedure   Colonoscopy showed a large fungating ulcerated partially obstructing mass in the proximal rectum. This was biopsied.   11/23/2015 Initial Biopsy   Rectal mass biopsy showed adenocarcinoma   11/25/2015 Imaging   CT chest, abdomen and pelvis with contrast showed focal wall thickening involving the low rectum, no findings suspicious for metastatic disease.   12/09/2015 Procedure   EUS showed a T3 N0 lesion in proximal rectum.   12/22/2015 - 02/01/2016 Radiation Therapy   New adjuvant irradiation to the rectal cancer   12/22/2015 - 02/01/2016 Chemotherapy   Neoadjuvant Xeloda 2000mg  in AM, and 1500mg  in PM with concurrent radiation    03/25/2016 Surgery   Robotic-assisted anterior resection of proximal rectal cancer   03/25/2016 Pathology Results   Rectal sigmoid segmental resection showed adenocarcinoma with muscularis propria and aubmucosa invasion, margins were negative, extensive fibrosis, 12 lymph nodes were negative.   05/04/2016 - 07/23/2016 Chemotherapy   Adjuvant Xeloda 2000mg  bid, 2 weeks on and one week off     12/15/2016 Imaging   CT CAP IMPRESSION: 1. Surgical changes from a prior rectal colon  resection and anastomosis. No CT findings to suggest recurrent tumor or metastatic disease. 2. Presacral soft tissue thickening is most likely postsurgical change and post radiation change. Attention on future scans is suggested. 3. No significant findings involving the chest. No evidence of pulmonary metastatic disease. Stable three-vessel coronary artery calcifications.   01/24/2018 Imaging   IMPRESSION: 1. No evidence of local recurrence or metastatic disease. Presacral fibrotic changes have mildly improved. 2. Stable small right lower quadrant abdominal wall hernia containing a knuckle of small bowel, possibly at previous ileostomy. 3. Coronary and Aortic Atherosclerosis (ICD10-I70.0). Bilateral L5 pars defects.    01/30/2019 Imaging   CT CAP W Contrast 01/30/19  IMPRESSION: 1. No evidence of local tumor recurrence or metastatic disease. 2. Aortic Atherosclerosis (ICD10-I70.0). Coronary artery calcifications. 3. Lumbar spondylosis with bilateral L5 pars defects and anterolisthesis of L5 on S1.       CURRENT THERAPY: Surveillance   INTERVAL HISTORY: Mr. Tigert returns with his daughter-in-law who is interpreting for follow up as scheduled.  He declined to use a third-party video remote interpreter. He was last seen for annual surveillance by Dr. Burr Medico on 02/03/20.  Colonoscopy by Dr. Paulita Fujita in 05/2020 was negative.  He feels well, denies changes in his health overall.  Energy and appetite are very good.  He continues working, active with his job, and gardens.  Denies residual side effects from chemoradiation.  Denies changes in his bowel habits, rectal bleeding or pain.  Denies recent fever/infection, cough, chest pain, dyspnea.  He cut back to 10 cigarettes/day, not interested in quitting completely.  He  drinks beer occasionally.   MEDICAL HISTORY:  Past Medical History:  Diagnosis Date  . Hypertension     SURGICAL HISTORY: Past Surgical History:  Procedure Laterality Date  .  EUS N/A 12/09/2015   Procedure: LOWER ENDOSCOPIC ULTRASOUND (EUS);  Surgeon: [name], MD;  Location: [facility];  Service: Endoscopy;  Laterality: N/A;  . XI ROBOTIC ASSISTED LOWER ANTERIOR RESECTION N/A 03/25/2016   Procedure: XI ROBOTIC ASSISTED LOWER ANTERIOR RESECTION;  Surgeon: Leighton Ruff, MD;  Location: [facility];  Service: General;  Laterality: N/A;    I have reviewed the social history and family history with the patient and they are unchanged from previous note.  ALLERGIES:  has No Known Allergies.  MEDICATIONS:  Current Outpatient Medications  Medication Sig Dispense Refill  . amLODipine (NORVASC) 5 MG tablet Take 1 tablet (5 mg total) by mouth daily. 90 tablet 3  . hydrochlorothiazide (HYDRODIURIL) 25 MG tablet Take 1 tablet (25 mg total) by mouth daily. 90 tablet 3  . rosuvastatin (CRESTOR) 20 MG tablet Take 1 tablet (20 mg total) by mouth daily. 90 tablet 3   No current facility-administered medications for this visit.    PHYSICAL EXAMINATION: ECOG PERFORMANCE STATUS: 0 - Asymptomatic  Vitals:   12/29/20 0940  BP: (!) 165/81  Pulse: 66  Resp: 18  Temp: (!) 97.3 F (36.3 C)  SpO2: 99%   Filed Weights   12/29/20 0940  Weight: 214 lb 11.2 oz (97.4 kg)    GENERAL:alert, no distress and comfortable SKIN: No rash EYES:  sclera clear LUNGS: clear with normal breathing effort HEART: regular rate & rhythm, no lower extremity edema ABDOMEN:abdomen soft, non-tender and normal bowel sounds NEURO: alert & oriented x 3 with fluent speech, no focal motor/sensory deficits Declined rectal exam  LABORATORY DATA:  I have reviewed the data as listed CBC Latest Ref Rng & Units 12/29/2020 02/03/2020 10/29/2019  WBC 4.0 - 10.5 K/uL 9.0 10.2 8.4  Hemoglobin 13.0 - 17.0 g/dL 15.6 16.4 16.2  Hematocrit 39.0 - 52.0 % 44.2 47.1 47.8  Platelets 150 - 400 K/uL 270 264 279     CMP Latest Ref Rng & Units 12/29/2020 04/27/2020 02/03/2020  Glucose 70 - 99 mg/dL 138(H) 125(H) 123(H)   BUN 8 - 23 mg/dL 16 14 17   Creatinine 0.61 - 1.24 mg/dL 1.01 0.96 1.07  Sodium 135 - 145 mmol/L 141 140 139  Potassium 3.5 - 5.1 mmol/L 4.1 4.6 4.1  Chloride 98 - 111 mmol/L 104 101 107  CO2 22 - 32 mmol/L 26 25 22   Calcium 8.9 - 10.3 mg/dL 9.4 9.6 9.6  Total Protein 6.5 - 8.1 g/dL 7.1 7.1 7.4  Total Bilirubin 0.3 - 1.2 mg/dL 0.5 0.3 0.4  Alkaline Phos 38 - 126 U/L 53 67 61  AST 15 - 41 U/L 14(L) 16 19  ALT 0 - 44 U/L 18 24 23       RADIOGRAPHIC STUDIES: I have personally reviewed the radiological images as listed and agreed with the findings in the report. No results found.   ASSESSMENT & PLAN: David Jacobs is a 70 y.o. male with    1. Proximal rectal adenocarcinoma, uT3N0M0, stage IIA, ypT2N0M0 -Diagnosed in 11/2015. S/p neoadjuvant concurrent chemoradiation with Xeloda, surgical resection and adjuvant Xeloda. -Surveillance imaging through 01/2019 showed NED -I reviewed his colonoscopy from 05/29/2020 by Dr. Paulita Fujita which was negative for malignancy  -He completed 5 years of colorectal cancer surveillance, we discussed the risk of this rectal cancer recurrence is low, but  he remains at risk for new colon cancers and I recommend to continue follow-up with PCP and GI and adhere to colonoscopy recommendations.    2. HTN -ContinueAmlodipine and HCTZ -Follow-up PCP  3. Smoking and alcohol cessation  -He has cut back smoking to 10 cigarettes/day, he is not interested in quitting completely  -CT chest 01/2019 showed no suspicious pulmonary nodules  -He drinks alcohol (beer) in moderation -He understands continuing to smoke and drink alcohol increases his risk of cancer recurrence and developing new cancers  Disposition: Mr. Theisen is clinically doing well.  Rectal exam deferred but otherwise benign.  CBC is normal, CMP unremarkable except BG 138.  CEA was ordered but not drawn, he declined to go back to lab.  I feel it's okay to omit as tumor marker was normal at the time of  initial diagnosis.  Colonoscopy 05/2020 was negative for malignancy.  Overall there is no clinical concern for rectal cancer recurrence.  He has completed 5 years of surveillance with medical oncology.  He will follow-up with GI and PCP, next colonoscopy 05/2025.  We reviewed signs and symptoms of new/recurrent CRC, he knows to call with unintentional weight loss, fatigue, change in bowel habits, rectal pain or bleeding.   We reviewed the importance of healthy lifestyle, smoking cessation, and limiting alcohol.  He is not interested in quitting smoking.  Last CT chest 01/2019 showed no suspicious pulmonary nodules.  He agrees to continue other age-appropriate cancer screenings.  F/up open, as needed in the future. Will cc note to GI and PCP.   All questions were answered. The patient knows to call the clinic with any problems, questions or concerns. No barriers to learning were detected with interpreting by his daughter in law. Total encounter time was 30 minutes.      Alla Feeling, NP 12/29/20

## 2021-02-02 ENCOUNTER — Other Ambulatory Visit: Payer: PRIVATE HEALTH INSURANCE

## 2021-02-02 ENCOUNTER — Ambulatory Visit: Payer: PRIVATE HEALTH INSURANCE | Admitting: Nurse Practitioner

## 2021-02-19 DIAGNOSIS — Z23 Encounter for immunization: Secondary | ICD-10-CM | POA: Diagnosis not present

## 2021-04-20 ENCOUNTER — Other Ambulatory Visit: Payer: Self-pay | Admitting: Emergency Medicine

## 2021-04-20 DIAGNOSIS — I10 Essential (primary) hypertension: Secondary | ICD-10-CM

## 2021-04-21 ENCOUNTER — Ambulatory Visit: Payer: Medicare HMO | Admitting: Emergency Medicine

## 2021-04-22 ENCOUNTER — Ambulatory Visit (INDEPENDENT_AMBULATORY_CARE_PROVIDER_SITE_OTHER): Payer: Medicare HMO | Admitting: Emergency Medicine

## 2021-04-22 ENCOUNTER — Other Ambulatory Visit: Payer: Self-pay

## 2021-04-22 ENCOUNTER — Encounter: Payer: Self-pay | Admitting: Emergency Medicine

## 2021-04-22 VITALS — BP 134/70 | HR 63 | Temp 98.0°F | Ht 70.0 in | Wt 210.0 lb

## 2021-04-22 DIAGNOSIS — Z23 Encounter for immunization: Secondary | ICD-10-CM

## 2021-04-22 DIAGNOSIS — I700 Atherosclerosis of aorta: Secondary | ICD-10-CM

## 2021-04-22 DIAGNOSIS — I10 Essential (primary) hypertension: Secondary | ICD-10-CM

## 2021-04-22 DIAGNOSIS — C20 Malignant neoplasm of rectum: Secondary | ICD-10-CM

## 2021-04-22 DIAGNOSIS — E785 Hyperlipidemia, unspecified: Secondary | ICD-10-CM | POA: Diagnosis not present

## 2021-04-22 DIAGNOSIS — E119 Type 2 diabetes mellitus without complications: Secondary | ICD-10-CM | POA: Diagnosis not present

## 2021-04-22 MED ORDER — ROSUVASTATIN CALCIUM 20 MG PO TABS: 20.0000 mg | ORAL_TABLET | Freq: Every day | ORAL | 3 refills | Status: DC

## 2021-04-22 MED ORDER — AMLODIPINE BESYLATE 5 MG PO TABS: 5.0000 mg | ORAL_TABLET | Freq: Every day | ORAL | 3 refills | Status: DC

## 2021-04-22 MED ORDER — HYDROCHLOROTHIAZIDE 25 MG PO TABS: 25.0000 mg | ORAL_TABLET | Freq: Every day | ORAL | 3 refills | Status: DC

## 2021-04-22 NOTE — Assessment & Plan Note (Signed)
Last office visit with oncologist reviewed with patient.  No concerns.  Stable.

## 2021-04-22 NOTE — Assessment & Plan Note (Signed)
Well-controlled hypertension.  Continue amlodipine 5 mg daily and hydrochlorothiazide 25 mg daily.  Dietary approaches to stop hypertension discussed. Follow-up in 6 months.

## 2021-04-22 NOTE — Progress Notes (Signed)
David Jacobs 70 y.o.   Chief Complaint  Patient presents with   Hypertension    F/u, med refill of amlodipine, hydrochlorothiazide, and crestor    HISTORY OF PRESENT ILLNESS: This is a 70 y.o. male with history of hypertension, dyslipidemia here for follow-up. #1 hypertension: On amlodipine 5 mg daily and hydrochlorothiazide 25 mg daily. #2 dyslipidemia: On rosuvastatin 20 mg daily. #3 history of colorectal cancer.  Last office visit with oncologist last June. Assessment and plan as follows: ASSESSMENT & PLAN: David Jacobs is a 70 y.o. male with      1. Proximal rectal adenocarcinoma, uT3N0M0, stage IIA, ypT2N0M0 -Diagnosed in 11/2015. S/p neoadjuvant concurrent chemoradiation with Xeloda, surgical resection and adjuvant Xeloda.  -Surveillance imaging through 01/2019 showed NED -I reviewed his colonoscopy from 05/29/2020 by Dr. Paulita Fujita which was negative for malignancy  -He completed 5 years of colorectal cancer surveillance, we discussed the risk of this rectal cancer recurrence is low, but he remains at risk for new colon cancers and I recommend to continue follow-up with PCP and GI and adhere to colonoscopy recommendations.     2. HTN -Continue Amlodipine and HCTZ -Follow-up PCP    3. Smoking and alcohol cessation  -He has cut back smoking to 10 cigarettes/day, he is not interested in quitting completely  -CT chest 01/2019 showed no suspicious pulmonary nodules  -He drinks alcohol (beer) in moderation -He understands continuing to smoke and drink alcohol increases his risk of cancer recurrence and developing new cancers   Disposition: David Jacobs is clinically doing well.  Rectal exam deferred but otherwise benign.  CBC is normal, CMP unremarkable except BG 138.  CEA was ordered but not drawn, he declined to go back to lab.  I feel it's okay to omit as tumor marker was normal at the time of initial diagnosis.  Colonoscopy 05/2020 was negative for malignancy.  Overall there is no  clinical concern for rectal cancer recurrence.  He has completed 5 years of surveillance with medical oncology.   He will follow-up with GI and PCP, next colonoscopy 05/2025.  We reviewed signs and symptoms of new/recurrent CRC, he knows to call with unintentional weight loss, fatigue, change in bowel habits, rectal pain or bleeding.    We reviewed the importance of healthy lifestyle, smoking cessation, and limiting alcohol.  He is not interested in quitting smoking.  Last CT chest 01/2019 showed no suspicious pulmonary nodules.  He agrees to continue other age-appropriate cancer screenings.   F/up open, as needed in the future. Will cc note to GI and PCP.    All questions were answered. The patient knows to call the clinic with any problems, questions or concerns. No barriers to learning were detected with interpreting by his daughter in law. Total encounter time was 30 minutes.       Alla Feeling, NP 12/29/20    BP Readings from Last 3 Encounters:  04/22/21 140/70  12/29/20 (!) 165/81  04/27/20 (!) 154/74  Overall doing very well.  Has no complaints or medical concerns today.  Today here with his daughter who is helping with translation.   Hypertension Pertinent negatives include no chest pain, headaches, palpitations or shortness of breath.    Prior to Admission medications   Medication Sig Start Date End Date Taking? Authorizing Provider  amLODipine (NORVASC) 5 MG tablet TAKE 1 TABLET BY MOUTH EVERY DAY 04/20/21  Yes Grisel Blumenstock, Ines Bloomer, MD  hydrochlorothiazide (HYDRODIURIL) 25 MG tablet Take 1 tablet (25 mg total) by  mouth daily. 04/27/20  Yes Sarie Stall, Ines Bloomer, MD  rosuvastatin (CRESTOR) 20 MG tablet Take 1 tablet (20 mg total) by mouth daily. 04/27/20 04/22/21 Yes Horald Pollen, MD    No Known Allergies  Patient Active Problem List   Diagnosis Date Noted   Atherosclerosis of aorta ([place]) 04/27/2020   Dyslipidemia 03/30/2018   Prediabetes 03/30/2018   Rectal  cancer ([place]) 12/07/2015   Essential hypertension 11/15/2015    Past Medical History:  Diagnosis Date   Hypertension     Past Surgical History:  Procedure Laterality Date   EUS N/A 12/09/2015   Procedure: LOWER ENDOSCOPIC ULTRASOUND (EUS);  Surgeon: [name], MD;  Location: [facility];  Service: Endoscopy;  Laterality: N/A;   XI ROBOTIC ASSISTED LOWER ANTERIOR RESECTION N/A 03/25/2016   Procedure: XI ROBOTIC ASSISTED LOWER ANTERIOR RESECTION;  Surgeon: Leighton Ruff, MD;  Location: [facility];  Service: General;  Laterality: N/A;    Social History   Socioeconomic History   Marital status: Married    Spouse name: Not on file   Number of children: 2   Years of education: Not on file   Highest education level: Not on file  Occupational History   Not on file  Tobacco Use   Smoking status: Every Day    Packs/day: 0.50    Years: 35.00    Pack years: 17.50    Types: Cigarettes   Smokeless tobacco: Never  Substance and Sexual Activity   Alcohol use: No    Alcohol/week: 2.0 - 3.0 standard drinks    Types: 2 - 3 Cans of beer per week   Drug use: No   Sexual activity: Not on file  Other Topics Concern   Not on file  Social History Narrative   Married, lives w/his wife and a son--minimal English   Works full time at [employer]   Current everyday smoker, with no plans to quit   Primary contact is daughter-in-law, David Jacobs (can interpret for him)   Social Determinants of Health   Financial Resource Strain: Not on file  Food Insecurity: Not on file  Transportation Needs: Not on file  Physical Activity: Not on file  Stress: Not on file  Social Connections: Not on file  Intimate Partner Violence: Not on file    Family History  Problem Relation Age of Onset   Cancer Mother 40       throid cancer    Hyperlipidemia Mother    Diabetes Father      Review of Systems  Constitutional: Negative.  Negative for chills and fever.  HENT: Negative.  Negative for  congestion and sore throat.   Respiratory: Negative.  Negative for cough and shortness of breath.   Cardiovascular: Negative.  Negative for chest pain and palpitations.  Gastrointestinal:  Negative for abdominal pain, diarrhea, nausea and vomiting.  Genitourinary: Negative.  Negative for dysuria and hematuria.  Musculoskeletal: Negative.   Skin: Negative.  Negative for rash.  Neurological: Negative.  Negative for dizziness and headaches.  All other systems reviewed and are negative.  Today's Vitals   04/22/21 1056  BP: 140/70  Pulse: 63  Temp: 98 F (36.7 C)  TempSrc: Oral  SpO2: 97%  Weight: 210 lb (95.3 kg)  Height: 5\' 10"  (1.778 m)   Body mass index is 30.13 kg/m. Wt Readings from Last 3 Encounters:  04/22/21 210 lb (95.3 kg)  12/29/20 214 lb 11.2 oz (97.4 kg)  04/27/20 211 lb (95.7 kg)    Physical Exam  Vitals reviewed.  Constitutional:      Appearance: Normal appearance.  HENT:     Head: Normocephalic.     Right Ear: Tympanic membrane, ear canal and external ear normal.     Left Ear: Tympanic membrane, ear canal and external ear normal.     Mouth/Throat:     Mouth: Mucous membranes are moist.     Pharynx: Oropharynx is clear.  Eyes:     Extraocular Movements: Extraocular movements intact.     Pupils: Pupils are equal, round, and reactive to light.  Cardiovascular:     Rate and Rhythm: Normal rate and regular rhythm.     Pulses: Normal pulses.     Heart sounds: Normal heart sounds.  Pulmonary:     Effort: Pulmonary effort is normal.     Breath sounds: Normal breath sounds.  Abdominal:     General: There is no distension.     Palpations: Abdomen is soft.     Tenderness: There is no abdominal tenderness.  Musculoskeletal:     Cervical back: Normal range of motion and neck supple.     Right lower leg: No edema.     Left lower leg: No edema.  Skin:    General: Skin is warm and dry.     Capillary Refill: Capillary refill takes less than 2 seconds.   Neurological:     General: No focal deficit present.     Mental Status: He is alert and oriented to person, place, and time.  Psychiatric:        Mood and Affect: Mood normal.        Behavior: Behavior normal.     ASSESSMENT & PLAN: Problem List Items Addressed This Visit       Cardiovascular and Mediastinum   Essential hypertension - Primary    Well-controlled hypertension.  Continue amlodipine 5 mg daily and hydrochlorothiazide 25 mg daily.  Dietary approaches to stop hypertension discussed. Follow-up in 6 months.      Relevant Medications   rosuvastatin (CRESTOR) 20 MG tablet   amLODipine (NORVASC) 5 MG tablet   hydrochlorothiazide (HYDRODIURIL) 25 MG tablet   Atherosclerosis of aorta ([facility])   Relevant Medications   rosuvastatin (CRESTOR) 20 MG tablet   amLODipine (NORVASC) 5 MG tablet   hydrochlorothiazide (HYDRODIURIL) 25 MG tablet     Digestive   Rectal cancer ([place])    Last office visit with oncologist reviewed with patient.  No concerns.  Stable.        Endocrine   Diet-controlled diabetes mellitus ([place])    Diet and nutrition discussed.  Advised to decrease amount of daily carbohydrate intake.        Relevant Medications   rosuvastatin (CRESTOR) 20 MG tablet     Other   Dyslipidemia    Diet and nutrition discussed.  Continue rosuvastatin 20 mg daily. Follow-up in 6 months       Relevant Medications   rosuvastatin (CRESTOR) 20 MG tablet   Other Visit Diagnoses     Need for influenza vaccination       Relevant Orders   Flu Vaccine QUAD High Dose(Fluad) (Completed)        Patient Instructions  Health Maintenance After Age 91 After age 105, you are at a higher risk for certain long-term diseases and infections as well as injuries from falls. Falls are a major cause of broken bones and head injuries in people who are older than age 76. Getting regular preventive care can help to keep  you healthy and well. Preventive care includes getting regular  testing and making lifestyle changes as recommended by your health care provider. Talk with your health care provider about: Which screenings and tests you should have. A screening is a test that checks for a disease when you have no symptoms. A diet and exercise plan that is right for you. What should I know about screenings and tests to prevent falls? Screening and testing are the best ways to find a health problem early. Early diagnosis and treatment give you the best chance of managing medical conditions that are common after age 67. Certain conditions and lifestyle choices may make you more likely to have a fall. Your health care provider may recommend: Regular vision checks. Poor vision and conditions such as cataracts can make you more likely to have a fall. If you wear glasses, make sure to get your prescription updated if your vision changes. Medicine review. Work with your health care provider to regularly review all of the medicines you are taking, including over-the-counter medicines. Ask your health care provider about any side effects that may make you more likely to have a fall. Tell your health care provider if any medicines that you take make you feel dizzy or sleepy. Osteoporosis screening. Osteoporosis is a condition that causes the bones to get weaker. This can make the bones weak and cause them to break more easily. Blood pressure screening. Blood pressure changes and medicines to control blood pressure can make you feel dizzy. Strength and balance checks. Your health care provider may recommend certain tests to check your strength and balance while standing, walking, or changing positions. Foot health exam. Foot pain and numbness, as well as not wearing proper footwear, can make you more likely to have a fall. Depression screening. You may be more likely to have a fall if you have a fear of falling, feel emotionally low, or feel unable to do activities that you used to do. Alcohol  use screening. Using too much alcohol can affect your balance and may make you more likely to have a fall. What actions can I take to lower my risk of falls? General instructions Talk with your health care provider about your risks for falling. Tell your health care provider if: You fall. Be sure to tell your health care provider about all falls, even ones that seem minor. You feel dizzy, sleepy, or off-balance. Take over-the-counter and prescription medicines only as told by your health care provider. These include any supplements. Eat a healthy diet and maintain a healthy weight. A healthy diet includes low-fat dairy products, low-fat (lean) meats, and fiber from whole grains, beans, and lots of fruits and vegetables. Home safety Remove any tripping hazards, such as rugs, cords, and clutter. Install safety equipment such as grab bars in bathrooms and safety rails on stairs. Keep rooms and walkways well-lit. Activity  Follow a regular exercise program to stay fit. This will help you maintain your balance. Ask your health care provider what types of exercise are appropriate for you. If you need a cane or walker, use it as recommended by your health care provider. Wear supportive shoes that have nonskid soles. Lifestyle Do not drink alcohol if your health care provider tells you not to drink. If you drink alcohol, limit how much you have: 0-1 drink a day for women. 0-2 drinks a day for men. Be aware of how much alcohol is in your drink. In the [place] one drink equals one typical bottle  of beer (12 oz), one-half glass of wine (5 oz), or one shot of hard liquor (1 oz). Do not use any products that contain nicotine or tobacco, such as cigarettes and e-cigarettes. If you need help quitting, ask your health care provider. Summary Having a healthy lifestyle and getting preventive care can help to protect your health and wellness after age 59. Screening and testing are the best way to find a health  problem early and help you avoid having a fall. Early diagnosis and treatment give you the best chance for managing medical conditions that are more common for people who are older than age 36. Falls are a major cause of broken bones and head injuries in people who are older than age 70. Take precautions to prevent a fall at home. Work with your health care provider to learn what changes you can make to improve your health and wellness and to prevent falls. This information is not intended to replace advice given to you by your health care provider. Make sure you discuss any questions you have with your health care provider. Document Revised: 09/18/2020 Document Reviewed: 06/26/2020 Elsevier Patient Education  2022 [place], MD [facility]

## 2021-04-22 NOTE — Patient Instructions (Signed)
Health Maintenance After Age 71 After age 71, you are at a higher risk for certain long-term diseases and infections as well as injuries from falls. Falls are a major cause of broken bones and head injuries in people who are older than age 71. Getting regular preventive care can help to keep you healthy and well. Preventive care includes getting regular testing and making lifestyle changes as recommended by your health care provider. Talk with your health care provider about: Which screenings and tests you should have. A screening is a test that checks for a disease when you have no symptoms. A diet and exercise plan that is right for you. What should I know about screenings and tests to prevent falls? Screening and testing are the best ways to find a health problem early. Early diagnosis and treatment give you the best chance of managing medical conditions that are common after age 71. Certain conditions and lifestyle choices may make you more likely to have a fall. Your health care provider may recommend: Regular vision checks. Poor vision and conditions such as cataracts can make you more likely to have a fall. If you wear glasses, make sure to get your prescription updated if your vision changes. Medicine review. Work with your health care provider to regularly review all of the medicines you are taking, including over-the-counter medicines. Ask your health care provider about any side effects that may make you more likely to have a fall. Tell your health care provider if any medicines that you take make you feel dizzy or sleepy. Osteoporosis screening. Osteoporosis is a condition that causes the bones to get weaker. This can make the bones weak and cause them to break more easily. Blood pressure screening. Blood pressure changes and medicines to control blood pressure can make you feel dizzy. Strength and balance checks. Your health care provider may recommend certain tests to check your strength and  balance while standing, walking, or changing positions. Foot health exam. Foot pain and numbness, as well as not wearing proper footwear, can make you more likely to have a fall. Depression screening. You may be more likely to have a fall if you have a fear of falling, feel emotionally low, or feel unable to do activities that you used to do. Alcohol use screening. Using too much alcohol can affect your balance and may make you more likely to have a fall. What actions can I take to lower my risk of falls? General instructions Talk with your health care provider about your risks for falling. Tell your health care provider if: You fall. Be sure to tell your health care provider about all falls, even ones that seem minor. You feel dizzy, sleepy, or off-balance. Take over-the-counter and prescription medicines only as told by your health care provider. These include any supplements. Eat a healthy diet and maintain a healthy weight. A healthy diet includes low-fat dairy products, low-fat (lean) meats, and fiber from whole grains, beans, and lots of fruits and vegetables. Home safety Remove any tripping hazards, such as rugs, cords, and clutter. Install safety equipment such as grab bars in bathrooms and safety rails on stairs. Keep rooms and walkways well-lit. Activity  Follow a regular exercise program to stay fit. This will help you maintain your balance. Ask your health care provider what types of exercise are appropriate for you. If you need a cane or walker, use it as recommended by your health care provider. Wear supportive shoes that have nonskid soles. Lifestyle Do not   drink alcohol if your health care provider tells you not to drink. If you drink alcohol, limit how much you have: 0-1 drink a day for women. 0-2 drinks a day for men. Be aware of how much alcohol is in your drink. In the [place] one drink equals one typical bottle of beer (12 oz), one-half glass of wine (5 oz), or one shot of  hard liquor (1 oz). Do not use any products that contain nicotine or tobacco, such as cigarettes and e-cigarettes. If you need help quitting, ask your health care provider. Summary Having a healthy lifestyle and getting preventive care can help to protect your health and wellness after age 71. Screening and testing are the best way to find a health problem early and help you avoid having a fall. Early diagnosis and treatment give you the best chance for managing medical conditions that are more common for people who are older than age 71. Falls are a major cause of broken bones and head injuries in people who are older than age 71. Take precautions to prevent a fall at home. Work with your health care provider to learn what changes you can make to improve your health and wellness and to prevent falls. This information is not intended to replace advice given to you by your health care provider. Make sure you discuss any questions you have with your health care provider. Document Revised: 09/18/2020 Document Reviewed: 06/26/2020 Elsevier Patient Education  2022 [employer] Inc[employer]  

## 2021-04-22 NOTE — Assessment & Plan Note (Signed)
Diet and nutrition discussed.  Advised to decrease amount of daily carbohydrate intake. 

## 2021-04-22 NOTE — Assessment & Plan Note (Signed)
Diet and nutrition discussed.  Continue rosuvastatin 20 mg daily. Follow-up in 6 months

## 2021-06-05 DIAGNOSIS — I10 Essential (primary) hypertension: Secondary | ICD-10-CM | POA: Diagnosis not present

## 2021-06-05 DIAGNOSIS — Z85038 Personal history of other malignant neoplasm of large intestine: Secondary | ICD-10-CM | POA: Diagnosis not present

## 2021-06-05 DIAGNOSIS — Z008 Encounter for other general examination: Secondary | ICD-10-CM | POA: Diagnosis not present

## 2021-06-05 DIAGNOSIS — I700 Atherosclerosis of aorta: Secondary | ICD-10-CM | POA: Diagnosis not present

## 2021-06-05 DIAGNOSIS — Z807 Family history of other malignant neoplasms of lymphoid, hematopoietic and related tissues: Secondary | ICD-10-CM | POA: Diagnosis not present

## 2021-06-05 DIAGNOSIS — Z833 Family history of diabetes mellitus: Secondary | ICD-10-CM | POA: Diagnosis not present

## 2021-06-05 DIAGNOSIS — R69 Illness, unspecified: Secondary | ICD-10-CM | POA: Diagnosis not present

## 2021-06-05 DIAGNOSIS — E785 Hyperlipidemia, unspecified: Secondary | ICD-10-CM | POA: Diagnosis not present

## 2021-06-05 DIAGNOSIS — Z8249 Family history of ischemic heart disease and other diseases of the circulatory system: Secondary | ICD-10-CM | POA: Diagnosis not present

## 2021-07-14 DIAGNOSIS — M545 Low back pain, unspecified: Secondary | ICD-10-CM | POA: Diagnosis not present

## 2021-10-20 ENCOUNTER — Ambulatory Visit (INDEPENDENT_AMBULATORY_CARE_PROVIDER_SITE_OTHER): Payer: Medicare HMO | Admitting: Emergency Medicine

## 2021-10-20 ENCOUNTER — Encounter: Payer: Self-pay | Admitting: Emergency Medicine

## 2021-10-20 ENCOUNTER — Ambulatory Visit (INDEPENDENT_AMBULATORY_CARE_PROVIDER_SITE_OTHER): Payer: Medicare HMO

## 2021-10-20 VITALS — BP 130/70 | HR 61 | Temp 98.1°F | Ht 70.0 in | Wt 211.0 lb

## 2021-10-20 DIAGNOSIS — M549 Dorsalgia, unspecified: Secondary | ICD-10-CM | POA: Diagnosis not present

## 2021-10-20 DIAGNOSIS — M545 Low back pain, unspecified: Secondary | ICD-10-CM | POA: Diagnosis not present

## 2021-10-20 MED ORDER — TRAMADOL HCL 50 MG PO TABS: 50.0000 mg | ORAL_TABLET | Freq: Three times a day (TID) | ORAL | 0 refills | Status: AC | PRN

## 2021-10-20 NOTE — Patient Instructions (Signed)

## 2021-10-20 NOTE — Assessment & Plan Note (Signed)
Mechanical pain without any red flag signs or symptoms. ?X-rays done today.  Degenerative changes seen.  No acute problem. ?No urinary symptoms. ?Tramadol for pain as needed. ?Follow-up in the office if no better or worse during the next couple weeks. ?

## 2021-10-20 NOTE — Progress Notes (Signed)
David Jacobs ?71 y.o. ? ? ?Chief Complaint  ?Patient presents with  ? Follow-up  ? Back Pain  ?  Lower back pain started Friday, taking OTC tylenol   ? ? ?HISTORY OF PRESENT ILLNESS: ?Acute problem visit today. ?This is a 71 y.o. male complaining of lumbar pain that started 5 days ago. ?Steady ache without significant radiation and not associated with any other symptoms. ? ?Back Pain ?Pertinent negatives include no abdominal pain, chest pain, dysuria, fever, headaches or weakness.  ? ? ?Prior to Admission medications   ?Medication Sig Start Date End Date Taking? Authorizing Provider  ?hydrochlorothiazide (HYDRODIURIL) 25 MG tablet Take 1 tablet (25 mg total) by mouth daily. 04/22/21  Yes Paden Senger, Ines Bloomer, MD  ?amLODipine (NORVASC) 5 MG tablet Take 1 tablet (5 mg total) by mouth daily. 04/22/21 07/21/21  Horald Pollen, MD  ?rosuvastatin (CRESTOR) 20 MG tablet Take 1 tablet (20 mg total) by mouth daily. 04/22/21 07/21/21  Horald Pollen, MD  ? ? ?No Known Allergies ? ?Patient Active Problem List  ? Diagnosis Date Noted  ? Atherosclerosis of aorta ([place]) 04/27/2020  ? Dyslipidemia 03/30/2018  ? Diet-controlled diabetes mellitus ([place]) 03/30/2018  ? Rectal cancer ([place]) 12/07/2015  ? Essential hypertension 11/15/2015  ? ? ?Past Medical History:  ?Diagnosis Date  ? Hypertension   ? ? ?Past Surgical History:  ?Procedure Laterality Date  ? EUS N/A 12/09/2015  ? Procedure: LOWER ENDOSCOPIC ULTRASOUND (EUS);  Surgeon: [name], MD;  Location: [facility];  Service: Endoscopy;  Laterality: N/A;  ? XI ROBOTIC ASSISTED LOWER ANTERIOR RESECTION N/A 03/25/2016  ? Procedure: XI ROBOTIC ASSISTED LOWER ANTERIOR RESECTION;  Surgeon: Leighton Ruff, MD;  Location: [facility];  Service: General;  Laterality: N/A;  ? ? ?Social History  ? ?Socioeconomic History  ? Marital status: Married  ?  Spouse name: Not on file  ? Number of children: 2  ? Years of education: Not on file  ? Highest education level: Not on file   ?Occupational History  ? Not on file  ?Tobacco Use  ? Smoking status: Every Day  ?  Packs/day: 0.50  ?  Years: 35.00  ?  Pack years: 17.50  ?  Types: Cigarettes  ? Smokeless tobacco: Never  ?Substance and Sexual Activity  ? Alcohol use: No  ?  Alcohol/week: 2.0 - 3.0 standard drinks  ?  Types: 2 - 3 Cans of beer per week  ? Drug use: No  ? Sexual activity: Not on file  ?Other Topics Concern  ? Not on file  ?Social History Narrative  ? Married, lives w/his wife and a son--minimal English  ? Works full time at [employer]  ? Current everyday smoker, with no plans to quit  ? Primary contact is daughter-in-law, Devlin Brink (can interpret for him)  ? ?Social Determinants of Health  ? ?Financial Resource Strain: Not on file  ?Food Insecurity: Not on file  ?Transportation Needs: Not on file  ?Physical Activity: Not on file  ?Stress: Not on file  ?Social Connections: Not on file  ?Intimate Partner Violence: Not on file  ? ? ?Family History  ?Problem Relation Age of Onset  ? Cancer Mother 41  ?     throid cancer   ? Hyperlipidemia Mother   ? Diabetes Father   ? ? ? ?Review of Systems  ?Constitutional: Negative.  Negative for chills and fever.  ?Cardiovascular:  Negative for chest pain and palpitations.  ?Gastrointestinal:  Negative for abdominal pain, diarrhea,  nausea and vomiting.  ?Genitourinary: Negative.  Negative for dysuria.  ?Musculoskeletal:  Positive for back pain.  ?Skin: Negative.  Negative for rash.  ?Neurological:  Negative for dizziness, sensory change, focal weakness, weakness and headaches.  ?All other systems reviewed and are negative. ? ?Today's Vitals  ? 10/20/21 1026 10/20/21 1039 10/20/21 1234  ?BP: (!) 160/78 (!) 156/74 130/70  ?Pulse: 61    ?Temp: 98.1 ?F (36.7 ?C)    ?TempSrc: Oral    ?SpO2: 98%    ?Weight: 211 lb (95.7 kg)    ?Height: '5\' 10"'$  (1.778 m)    ? ?Body mass index is 30.28 kg/m?. ? ?Physical Exam ?Vitals reviewed.  ?Constitutional:   ?   Appearance: Normal appearance.  ?HENT:  ?    Head: Normocephalic.  ?Eyes:  ?   Extraocular Movements: Extraocular movements intact.  ?   Pupils: Pupils are equal, round, and reactive to light.  ?Cardiovascular:  ?   Rate and Rhythm: Normal rate and regular rhythm.  ?   Pulses: Normal pulses.  ?   Heart sounds: Normal heart sounds.  ?Pulmonary:  ?   Effort: Pulmonary effort is normal.  ?   Breath sounds: Normal breath sounds.  ?Abdominal:  ?   Palpations: Abdomen is soft.  ?   Tenderness: There is no abdominal tenderness.  ?Musculoskeletal:  ?   Cervical back: No tenderness.  ?   Lumbar back: Spasms and tenderness present. No bony tenderness. Decreased range of motion. Negative right straight leg raise test and negative left straight leg raise test.  ?Lymphadenopathy:  ?   Cervical: No cervical adenopathy.  ?Skin: ?   General: Skin is warm and dry.  ?   Capillary Refill: Capillary refill takes less than 2 seconds.  ?Neurological:  ?   General: No focal deficit present.  ?   Mental Status: He is alert and oriented to person, place, and time.  ?Psychiatric:     ?   Mood and Affect: Mood normal.     ?   Behavior: Behavior normal.  ? ? ?DG Lumbar Spine 2-3 Views ? ?Result Date: 10/20/2021 ?CLINICAL DATA:  Right-sided low back pain for 5 days EXAM: LUMBAR SPINE - 2-3 VIEW COMPARISON:  CT abdomen/pelvis 01/30/2019 FINDINGS: There are 5 non-rib-bearing lumbar type vertebral bodies. Vertebral body heights are preserved, without evidence of acute injury. There is grade 1 anterolisthesis of L5 on S1 with associated bilateral pars defects, similar to the CT from 2020. Alignment is otherwise normal. There is disc space narrowing at L5-S1 with associated degenerative endplate change. The disc heights are otherwise overall preserved. There is degenerative endplate change with anterior osteophytes throughout the remainder of the lumbar spine. There is mild facet arthropathy throughout the lumbar spine, more advanced at L5-S1. IMPRESSION: 1. No acute findings in the lumbar  spine. 2. Grade 1 anterolisthesis of L5 on S1 with associated disc space narrowing, facet arthropathy, and bilateral pars defects. 3. Mild degenerative changes throughout the remainder of the lumbar spine. Electronically Signed   By: Valetta Mole M.D.   On: 10/20/2021 11:22   ? ?ASSESSMENT & PLAN: ?Problem List Items Addressed This Visit   ? ?  ? Other  ? Musculoskeletal back pain - Primary  ?  Mechanical pain without any red flag signs or symptoms. ?X-rays done today.  Degenerative changes seen.  No acute problem. ?No urinary symptoms. ?Tramadol for pain as needed. ?Follow-up in the office if no better or worse during the next couple  weeks. ?  ?  ? Relevant Medications  ? traMADol (ULTRAM) 50 MG tablet  ? ?Patient Instructions  ?Acute Back Pain, Adult ?Acute back pain is sudden and usually short-lived. It is often caused by an injury to the muscles and tissues in the back. The injury may result from: ?A muscle, tendon, or ligament getting overstretched or torn. Ligaments are tissues that connect bones to each other. Lifting something improperly can cause a back strain. ?Wear and tear (degeneration) of the spinal disks. Spinal disks are circular tissue that provide cushioning between the bones of the spine (vertebrae). ?Twisting motions, such as while playing sports or doing yard work. ?A hit to the back. ?Arthritis. ?You may have a physical exam, lab tests, and imaging tests to find the cause of your pain. Acute back pain usually goes away with rest and home care. ?Follow these instructions at home: ?Managing pain, stiffness, and swelling ?Take over-the-counter and prescription medicines only as told by your health care provider. Treatment may include medicines for pain and inflammation that are taken by mouth or applied to the skin, or muscle relaxants. ?Your health care provider may recommend applying ice during the first 24-48 hours after your pain starts. To do this: ?Put ice in a plastic bag. ?Place a towel  between your skin and the bag. ?Leave the ice on for 20 minutes, 2-3 times a day. ?Remove the ice if your skin turns bright red. This is very important. If you cannot feel pain, heat, or cold, you have a greater ri

## 2022-03-22 DIAGNOSIS — E669 Obesity, unspecified: Secondary | ICD-10-CM | POA: Diagnosis not present

## 2022-03-22 DIAGNOSIS — I10 Essential (primary) hypertension: Secondary | ICD-10-CM | POA: Diagnosis not present

## 2022-03-22 DIAGNOSIS — G8929 Other chronic pain: Secondary | ICD-10-CM | POA: Diagnosis not present

## 2022-03-22 DIAGNOSIS — E785 Hyperlipidemia, unspecified: Secondary | ICD-10-CM | POA: Diagnosis not present

## 2022-03-22 DIAGNOSIS — Z85038 Personal history of other malignant neoplasm of large intestine: Secondary | ICD-10-CM | POA: Diagnosis not present

## 2022-03-22 DIAGNOSIS — Z6832 Body mass index (BMI) 32.0-32.9, adult: Secondary | ICD-10-CM | POA: Diagnosis not present

## 2022-03-22 DIAGNOSIS — Z8249 Family history of ischemic heart disease and other diseases of the circulatory system: Secondary | ICD-10-CM | POA: Diagnosis not present

## 2022-03-22 DIAGNOSIS — R69 Illness, unspecified: Secondary | ICD-10-CM | POA: Diagnosis not present

## 2022-05-10 ENCOUNTER — Ambulatory Visit (INDEPENDENT_AMBULATORY_CARE_PROVIDER_SITE_OTHER): Payer: Medicare HMO | Admitting: Emergency Medicine

## 2022-05-10 ENCOUNTER — Encounter: Payer: Self-pay | Admitting: Emergency Medicine

## 2022-05-10 VITALS — BP 160/88 | HR 65 | Temp 98.4°F | Ht 70.0 in | Wt 210.0 lb

## 2022-05-10 DIAGNOSIS — Z23 Encounter for immunization: Secondary | ICD-10-CM

## 2022-05-10 DIAGNOSIS — R69 Illness, unspecified: Secondary | ICD-10-CM | POA: Diagnosis not present

## 2022-05-10 DIAGNOSIS — M549 Dorsalgia, unspecified: Secondary | ICD-10-CM

## 2022-05-10 DIAGNOSIS — G8929 Other chronic pain: Secondary | ICD-10-CM | POA: Diagnosis not present

## 2022-05-10 DIAGNOSIS — E785 Hyperlipidemia, unspecified: Secondary | ICD-10-CM | POA: Diagnosis not present

## 2022-05-10 DIAGNOSIS — F17200 Nicotine dependence, unspecified, uncomplicated: Secondary | ICD-10-CM

## 2022-05-10 DIAGNOSIS — I10 Essential (primary) hypertension: Secondary | ICD-10-CM

## 2022-05-10 DIAGNOSIS — M545 Low back pain, unspecified: Secondary | ICD-10-CM

## 2022-05-10 DIAGNOSIS — E119 Type 2 diabetes mellitus without complications: Secondary | ICD-10-CM

## 2022-05-10 LAB — LIPID PANEL
Cholesterol: 118 mg/dL (ref 0–200)
HDL: 36.9 mg/dL — ABNORMAL LOW (ref >39.00)
LDL Cholesterol: 48 mg/dL (ref 0–99)
NonHDL: 81.23
Total CHOL/HDL Ratio: 3
Triglycerides: 168 mg/dL — ABNORMAL HIGH (ref 0.0–149.0)
VLDL: 33.6 mg/dL (ref 0.0–40.0)

## 2022-05-10 LAB — CBC WITH DIFFERENTIAL/PLATELET
Basophils Absolute: 0.1 10*3/uL (ref 0.0–0.1)
Basophils Relative: 0.7 % (ref 0.0–3.0)
Eosinophils Absolute: 0.4 10*3/uL (ref 0.0–0.7)
Eosinophils Relative: 4.1 % (ref 0.0–5.0)
HCT: 48.1 % (ref 39.0–52.0)
Hemoglobin: 16.6 g/dL (ref 13.0–17.0)
Lymphocytes Relative: 33.1 % (ref 12.0–46.0)
Lymphs Abs: 3.1 10*3/uL (ref 0.7–4.0)
MCHC: 34.5 g/dL (ref 30.0–36.0)
MCV: 92.9 fl (ref 78.0–100.0)
Monocytes Absolute: 0.5 10*3/uL (ref 0.1–1.0)
Monocytes Relative: 5.6 % (ref 3.0–12.0)
Neutro Abs: 5.3 10*3/uL (ref 1.4–7.7)
Neutrophils Relative %: 56.5 % (ref 43.0–77.0)
Platelets: 274 10*3/uL (ref 150.0–400.0)
RBC: 5.18 Mil/uL (ref 4.22–5.81)
RDW: 13.2 % (ref 11.5–15.5)
WBC: 9.3 10*3/uL (ref 4.0–10.5)

## 2022-05-10 LAB — COMPREHENSIVE METABOLIC PANEL
ALT: 18 U/L (ref 0–53)
AST: 17 U/L (ref 0–37)
Albumin: 4.8 g/dL (ref 3.5–5.2)
Alkaline Phosphatase: 55 U/L (ref 39–117)
BUN: 15 mg/dL (ref 6–23)
CO2: 29 mEq/L (ref 19–32)
Calcium: 10 mg/dL (ref 8.4–10.5)
Chloride: 100 mEq/L (ref 96–112)
Creatinine, Ser: 0.94 mg/dL (ref 0.40–1.50)
GFR: 81.2 mL/min (ref >60.00)
Glucose, Bld: 109 mg/dL — ABNORMAL HIGH (ref 70–99)
Potassium: 3.8 mEq/L (ref 3.5–5.1)
Sodium: 137 mEq/L (ref 135–145)
Total Bilirubin: 0.6 mg/dL (ref 0.2–1.2)
Total Protein: 7.6 g/dL (ref 6.0–8.3)

## 2022-05-10 LAB — HEMOGLOBIN A1C: Hgb A1c MFr Bld: 7.3 % — ABNORMAL HIGH (ref 4.6–6.5)

## 2022-05-10 MED ORDER — VALSARTAN-HYDROCHLOROTHIAZIDE 160-12.5 MG PO TABS: 1.0000 | ORAL_TABLET | Freq: Every day | ORAL | 3 refills | Status: DC

## 2022-05-10 MED ORDER — AMLODIPINE BESYLATE 5 MG PO TABS: 5.0000 mg | ORAL_TABLET | Freq: Every day | ORAL | 3 refills | Status: DC

## 2022-05-10 MED ORDER — ROSUVASTATIN CALCIUM 20 MG PO TABS: 20.0000 mg | ORAL_TABLET | Freq: Every day | ORAL | 3 refills | Status: DC

## 2022-05-10 NOTE — Progress Notes (Signed)
David Jacobs 71 y.o.   Chief Complaint  Patient presents with   Follow-up    F/u appt , no concerns     HISTORY OF PRESENT ILLNESS: This is a 71 y.o. male here for 76-monthfollow-up of hypertension, diabetes, dyslipidemia Compliant with medications Also has history of chronic low back pain.  Requesting work note for restrictions. Accompanied by daughter who is helping with translation. No other complaints or medical concerns today. BP Readings from Last 3 Encounters:  05/10/22 (!) 160/88  10/20/21 130/70  04/22/21 134/70   Lab Results  Component Value Date   HGBA1C 6.8 (H) 04/27/2020   Lab Results  Component Value Date   CHOL 123 04/27/2020   HDL 42 04/27/2020   LDLCALC 60 04/27/2020   TRIG 115 04/27/2020   CHOLHDL 2.9 04/27/2020     HPI   Prior to Admission medications   Medication Sig Start Date End Date Taking? Authorizing Provider  hydrochlorothiazide (HYDRODIURIL) 25 MG tablet Take 1 tablet (25 mg total) by mouth daily. 04/22/21  Yes Milla Wahlberg, MInes Bloomer MD  amLODipine (NORVASC) 5 MG tablet Take 1 tablet (5 mg total) by mouth daily. 04/22/21 07/21/21  SHorald Pollen MD  rosuvastatin (CRESTOR) 20 MG tablet Take 1 tablet (20 mg total) by mouth daily. 04/22/21 07/21/21  SHorald Pollen MD    No Known Allergies  Patient Active Problem List   Diagnosis Date Noted   Musculoskeletal back pain 10/20/2021   [place] 04/27/[place]   Dyslipidemia 03/30/2018   Diet-controlled diabetes mellitus ([place] 03/30/2018   [place] 12/07/2015   Essential hypertension 11/15/2015    Past Medical History:  Diagnosis Date   Hypertension     Past Surgical History:  Procedure Laterality Date   EUS N/A 12/09/2015   Procedure: LOWER ENDOSCOPIC ULTRASOUND (EUS);  Surgeon: WArta Silence MD;  [facility];  Service: Endoscopy;  Laterality: N/A;   XI ROBOTIC ASSISTED LOWER ANTERIOR RESECTION N/A 03/25/2016   Procedure: XI  ROBOTIC ASSISTED LOWER ANTERIOR RESECTION;  Surgeon: ALeighton Ruff MD;  Location: [facility];  Service: General;  Laterality: N/A;    Social History   Socioeconomic History   Marital status: Married    Spouse name: Not on file   Number of children: 2   Years of education: Not on file   Highest education level: Not on file  Occupational History   Not on file  Tobacco Use   Smoking status: Every Day    Packs/day: 0.50    Years: 35.00    Total pack years: 17.50    Types: Cigarettes   Smokeless tobacco: Never  Substance and Sexual Activity   Alcohol use: No    Alcohol/week: 2.0 - 3.0 standard drinks of alcohol    Types: 2 - 3 Cans of beer per week   Drug use: No   Sexual activity: Not on file  Other Topics Concern   Not on file  Social History Narrative   Married, lives w/his wife and a son--minimal English   Works full time at [employer]  Current everyday smoker, with no plans to quit   Primary contact is daughter-in-law, NEuriah Matlack(can interpret for him)   Social Determinants of Health   Financial Resource Strain: Not on file  Food Insecurity: Not on file  Transportation Needs: Not on file  Physical Activity: Not on file  Stress: Not on file  Social Connections: Not on file  Intimate Partner Violence: Not on file  Family History  Problem Relation Age of Onset   Cancer Mother 29       throid cancer    Hyperlipidemia Mother    Diabetes Father      Review of Systems  Constitutional: Negative.  Negative for chills and fever.  HENT: Negative.  Negative for congestion and sore throat.   Respiratory: Negative.  Negative for cough and shortness of breath.   Cardiovascular: Negative.  Negative for chest pain and palpitations.  Gastrointestinal: Negative.  Negative for abdominal pain, diarrhea, nausea and vomiting.  Musculoskeletal:  Positive for back pain.  Skin: Negative.  Negative for rash.  Neurological: Negative.  Negative for dizziness and  headaches.  All other systems reviewed and are negative.   Today's Vitals   05/10/22 1436  BP: (!) 160/88  Pulse: 65  Temp: 98.4 F (36.9 C)  TempSrc: Oral  SpO2: 99%  Weight: 210 lb (95.3 kg)  Height: '5\' 10"'$  (1.778 m)   Body mass index is 30.13 kg/m. BP Readings from Last 3 Encounters:  05/10/22 (!) 160/88  10/20/21 130/70  04/22/21 134/70   Wt Readings from Last 3 Encounters:  05/10/22 210 lb (95.3 kg)  10/20/21 211 lb (95.7 kg)  04/22/21 210 lb (95.3 kg)    Physical Exam Vitals reviewed.  Constitutional:      Appearance: Normal appearance.  HENT:     Head: Normocephalic.     Mouth/Throat:     Mouth: Mucous membranes are moist.     Pharynx: Oropharynx is clear.  Eyes:     Extraocular Movements: Extraocular movements intact.     Conjunctiva/sclera: Conjunctivae normal.     Pupils: Pupils are equal, round, and reactive to light.  Cardiovascular:     Rate and Rhythm: Normal rate and regular rhythm.     Pulses: Normal pulses.     Heart sounds: Normal heart sounds.  Pulmonary:     Effort: Pulmonary effort is normal.     Breath sounds: Normal breath sounds.  Abdominal:     Palpations: Abdomen is soft.     Tenderness: There is no abdominal tenderness.  Musculoskeletal:     Cervical back: No tenderness.     Right lower leg: No edema.     Left lower leg: No edema.  Lymphadenopathy:     Cervical: No cervical adenopathy.  Skin:    General: Skin is warm and dry.  Neurological:     General: No focal deficit present.     Mental Status: He is alert and oriented to person, place, and time.  Psychiatric:        Mood and Affect: Mood normal.        Behavior: Behavior normal.      ASSESSMENT & PLAN: A total of 48 minutes was spent with the patient and counseling/coordination of care regarding preparing for this visit, review of most recent office visit notes, review of most recent blood work results, review of multiple chronic medical problems and their  management, review of all medications and changes made, diagnosis of uncontrolled hypertension and cardiovascular risk associated with this condition, smoking cessation advised, education on nutrition, prognosis, need for blood work, documentation, and need for follow-up.  Problem List Items Addressed This Visit       Cardiovascular and Mediastinum   Essential hypertension - Primary    Uncontrolled hypertension. Continue amlodipine 5 mg daily. Stop hydrochlorothiazide 25 mg Started valsartan HCTZ 160-12.5 mg daily. Cardiovascular risks associated with uncontrolled hypertension discussed. Diet approaches to stop  hypertension discussed.      Relevant Medications   amLODipine (NORVASC) 5 MG tablet   rosuvastatin (CRESTOR) 20 MG tablet   valsartan-hydrochlorothiazide (DIOVAN-HCT) 160-12.5 MG tablet   Other Relevant Orders   CBC with Differential/Platelet   Comprehensive metabolic panel   Hemoglobin A1c     Endocrine   Diet-controlled diabetes mellitus ([place])    Stable.  Hemoglobin A1c done today. Diet and noted patient discussed.      Relevant Medications   rosuvastatin (CRESTOR) 20 MG tablet   valsartan-hydrochlorothiazide (DIOVAN-HCT) 160-12.5 MG tablet     Other   Dyslipidemia    Diet and nutrition discussed. Lipid profile done today. Continue rosuvastatin 20 mg daily.       Relevant Medications   rosuvastatin (CRESTOR) 20 MG tablet   Other Relevant Orders   CBC with Differential/Platelet   Hemoglobin A1c   Lipid panel   Musculoskeletal back pain    Work restrictions recommended. Work note printed. Avoid prolonged standing and avoid heavy lifting, pulling or pushing. May take Tylenol.  Avoid NSAIDs.      Current smoker    Cardiovascular and cancer risk associated with smoking discussed.  Contributing to elevated blood pressure readings. Smoking cessation advice given.      Relevant Orders   CBC with Differential/Platelet   Other Visit Diagnoses     Need  for vaccination       Relevant Orders   Flu Vaccine QUAD High Dose(Fluad)   Pneumococcal conjugate vaccine 20-valent (Prevnar 20)   Chronic bilateral low back pain without sciatica           Patient Instructions  Continue amlodipine 5 mg daily Stop hydrochlorothiazide 25 mg. Start valsartan HCTZ 160-12.5 mg daily Follow-up in 3 months  Hypertension, Adult High blood pressure (hypertension) is when the force of blood pumping through the arteries is too strong. The arteries are the blood vessels that carry blood from the heart throughout the body. Hypertension forces the heart to work harder to pump blood and may cause arteries to become narrow or stiff. Untreated or uncontrolled hypertension can lead to a heart attack, heart failure, a stroke, kidney disease, and other problems. A blood pressure reading consists of a higher number over a lower number. Ideally, your blood pressure should be below 120/80. The first ("top") number is called the systolic pressure. It is a measure of the pressure in your arteries as your heart beats. The second ("bottom") number is called the diastolic pressure. It is a measure of the pressure in your arteries as the heart relaxes. What are the causes? The exact cause of this condition is not known. There are some conditions that result in high blood pressure. What increases the risk? Certain factors may make you more likely to develop high blood pressure. Some of these risk factors are under your control, including: Smoking. Not getting enough exercise or physical activity. Being overweight. Having too much fat, sugar, calories, or salt (sodium) in your diet. Drinking too much alcohol. Other risk factors include: Having a personal history of heart disease, diabetes, high cholesterol, or kidney disease. Stress. Having a family history of high blood pressure and high cholesterol. Having obstructive sleep apnea. Age. The risk increases with age. What are the  signs or symptoms? High blood pressure may not cause symptoms. Very high blood pressure (hypertensive crisis) may cause: Headache. Fast or irregular heartbeats (palpitations). Shortness of breath. Nosebleed. Nausea and vomiting. Vision changes. Severe chest pain, dizziness, and seizures. How is this  diagnosed? This condition is diagnosed by measuring your blood pressure while you are seated, with your arm resting on a flat surface, your legs uncrossed, and your feet flat on the floor. The cuff of the blood pressure monitor will be placed directly against the skin of your upper arm at the level of your heart. Blood pressure should be measured at least twice using the same arm. Certain conditions can cause a difference in blood pressure between your right and left arms. If you have a high blood pressure reading during one visit or you have normal blood pressure with other risk factors, you may be asked to: Return on a different day to have your blood pressure checked again. Monitor your blood pressure at home for 1 week or longer. If you are diagnosed with hypertension, you may have other blood or imaging tests to help your health care provider understand your overall risk for other conditions. How is this treated? This condition is treated by making healthy lifestyle changes, such as eating healthy foods, exercising more, and reducing your alcohol intake. You may be referred for counseling on a healthy diet and physical activity. Your health care provider may prescribe medicine if lifestyle changes are not enough to get your blood pressure under control and if: Your systolic blood pressure is above 130. Your diastolic blood pressure is above 80. Your personal target blood pressure may vary depending on your medical conditions, your age, and other factors. Follow these instructions at home: Eating and drinking  Eat a diet that is high in fiber and potassium, and low in sodium, added sugar, and  fat. An example of this eating plan is called the DASH diet. DASH stands for Dietary Approaches to Stop Hypertension. To eat this way: Eat plenty of fresh fruits and vegetables. Try to fill one half of your plate at each meal with fruits and vegetables. Eat whole grains, such as whole-wheat pasta, brown rice, or whole-grain bread. Fill about one fourth of your plate with whole grains. Eat or drink low-fat dairy products, such as skim milk or low-fat yogurt. Avoid fatty cuts of meat, processed or cured meats, and poultry with skin. Fill about one fourth of your plate with lean proteins, such as fish, chicken without skin, beans, eggs, or tofu. Avoid pre-made and processed foods. These tend to be higher in sodium, added sugar, and fat. Reduce your daily sodium intake. Many people with hypertension should eat less than 1,500 mg of sodium a day. Do not drink alcohol if: Your health care provider tells you not to drink. You are pregnant, may be pregnant, or are planning to become pregnant. If you drink alcohol: Limit how much you have to: 0-1 drink a day for women. 0-2 drinks a day for men. Know how much alcohol is in your drink. In the [place] one drink equals one 12 oz bottle of beer (355 mL), one 5 oz glass of wine (148 mL), or one 1 oz glass of hard liquor (44 mL). Lifestyle  Work with your health care provider to maintain a healthy body weight or to lose weight. Ask what an ideal weight is for you. Get at least 30 minutes of exercise that causes your heart to beat faster (aerobic exercise) most days of the week. Activities may include walking, swimming, or biking. Include exercise to strengthen your muscles (resistance exercise), such as Pilates or lifting weights, as part of your weekly exercise routine. Try to do these types of exercises for 30 minutes at  least 3 days a week. Do not use any products that contain nicotine or tobacco. These products include cigarettes, chewing tobacco, and vaping  devices, such as e-cigarettes. If you need help quitting, ask your health care provider. Monitor your blood pressure at home as told by your health care provider. Keep all follow-up visits. This is important. Medicines Take over-the-counter and prescription medicines only as told by your health care provider. Follow directions carefully. Blood pressure medicines must be taken as prescribed. Do not skip doses of blood pressure medicine. Doing this puts you at risk for problems and can make the medicine less effective. Ask your health care provider about side effects or reactions to medicines that you should watch for. Contact a health care provider if you: Think you are having a reaction to a medicine you are taking. Have headaches that keep coming back (recurring). Feel dizzy. Have swelling in your ankles. Have trouble with your vision. Get help right away if you: Develop a severe headache or confusion. Have unusual weakness or numbness. Feel faint. Have severe pain in your chest or abdomen. Vomit repeatedly. Have trouble breathing. These symptoms may be an emergency. Get help right away. Call 911. Do not wait to see if the symptoms will go away. Do not drive yourself to the hospital. Summary Hypertension is when the force of blood pumping through your arteries is too strong. If this condition is not controlled, it may put you at risk for serious complications. Your personal target blood pressure may vary depending on your medical conditions, your age, and other factors. For most people, a normal blood pressure is less than 120/80. Hypertension is treated with lifestyle changes, medicines, or a combination of both. Lifestyle changes include losing weight, eating a healthy, low-sodium diet, exercising more, and limiting alcohol. This information is not intended to replace advice given to you by your health care provider. Make sure you discuss any questions you have with your health care  provider. Document Revised: 05/18/2021 Document Reviewed: 05/18/2021 Elsevier Patient Education  Tool, MD [facility]

## 2022-05-10 NOTE — Assessment & Plan Note (Signed)
Diet and nutrition discussed. Lipid profile done today. Continue rosuvastatin 20 mg daily.

## 2022-05-10 NOTE — Patient Instructions (Signed)
Continue amlodipine 5 mg daily Stop hydrochlorothiazide 25 mg. Start valsartan HCTZ 160-12.5 mg daily Follow-up in 3 months  Hypertension, Adult High blood pressure (hypertension) is when the force of blood pumping through the arteries is too strong. The arteries are the blood vessels that carry blood from the heart throughout the body. Hypertension forces the heart to work harder to pump blood and may cause arteries to become narrow or stiff. Untreated or uncontrolled hypertension can lead to a heart attack, heart failure, a stroke, kidney disease, and other problems. A blood pressure reading consists of a higher number over a lower number. Ideally, your blood pressure should be below 120/80. The first ("top") number is called the systolic pressure. It is a measure of the pressure in your arteries as your heart beats. The second ("bottom") number is called the diastolic pressure. It is a measure of the pressure in your arteries as the heart relaxes. What are the causes? The exact cause of this condition is not known. There are some conditions that result in high blood pressure. What increases the risk? Certain factors may make you more likely to develop high blood pressure. Some of these risk factors are under your control, including: Smoking. Not getting enough exercise or physical activity. Being overweight. Having too much fat, sugar, calories, or salt (sodium) in your diet. Drinking too much alcohol. Other risk factors include: Having a personal history of heart disease, diabetes, high cholesterol, or kidney disease. Stress. Having a family history of high blood pressure and high cholesterol. Having obstructive sleep apnea. Age. The risk increases with age. What are the signs or symptoms? High blood pressure may not cause symptoms. Very high blood pressure (hypertensive crisis) may cause: Headache. Fast or irregular heartbeats (palpitations). Shortness of breath. Nosebleed. Nausea  and vomiting. Vision changes. Severe chest pain, dizziness, and seizures. How is this diagnosed? This condition is diagnosed by measuring your blood pressure while you are seated, with your arm resting on a flat surface, your legs uncrossed, and your feet flat on the floor. The cuff of the blood pressure monitor will be placed directly against the skin of your upper arm at the level of your heart. Blood pressure should be measured at least twice using the same arm. Certain conditions can cause a difference in blood pressure between your right and left arms. If you have a high blood pressure reading during one visit or you have normal blood pressure with other risk factors, you may be asked to: Return on a different day to have your blood pressure checked again. Monitor your blood pressure at home for 1 week or longer. If you are diagnosed with hypertension, you may have other blood or imaging tests to help your health care provider understand your overall risk for other conditions. How is this treated? This condition is treated by making healthy lifestyle changes, such as eating healthy foods, exercising more, and reducing your alcohol intake. You may be referred for counseling on a healthy diet and physical activity. Your health care provider may prescribe medicine if lifestyle changes are not enough to get your blood pressure under control and if: Your systolic blood pressure is above 130. Your diastolic blood pressure is above 80. Your personal target blood pressure may vary depending on your medical conditions, your age, and other factors. Follow these instructions at home: Eating and drinking  Eat a diet that is high in fiber and potassium, and low in sodium, added sugar, and fat. An example of this  eating plan is called the DASH diet. DASH stands for Dietary Approaches to Stop Hypertension. To eat this way: Eat plenty of fresh fruits and vegetables. Try to fill one half of your plate at each  meal with fruits and vegetables. Eat whole grains, such as whole-wheat pasta, brown rice, or whole-grain bread. Fill about one fourth of your plate with whole grains. Eat or drink low-fat dairy products, such as skim milk or low-fat yogurt. Avoid fatty cuts of meat, processed or cured meats, and poultry with skin. Fill about one fourth of your plate with lean proteins, such as fish, chicken without skin, beans, eggs, or tofu. Avoid pre-made and processed foods. These tend to be higher in sodium, added sugar, and fat. Reduce your daily sodium intake. Many people with hypertension should eat less than 1,500 mg of sodium a day. Do not drink alcohol if: Your health care provider tells you not to drink. You are pregnant, may be pregnant, or are planning to become pregnant. If you drink alcohol: Limit how much you have to: 0-1 drink a day for women. 0-2 drinks a day for men. Know how much alcohol is in your drink. In the [place] one drink equals one 12 oz bottle of beer (355 mL), one 5 oz glass of wine (148 mL), or one 1 oz glass of hard liquor (44 mL). Lifestyle  Work with your health care provider to maintain a healthy body weight or to lose weight. Ask what an ideal weight is for you. Get at least 30 minutes of exercise that causes your heart to beat faster (aerobic exercise) most days of the week. Activities may include walking, swimming, or biking. Include exercise to strengthen your muscles (resistance exercise), such as Pilates or lifting weights, as part of your weekly exercise routine. Try to do these types of exercises for 30 minutes at least 3 days a week. Do not use any products that contain nicotine or tobacco. These products include cigarettes, chewing tobacco, and vaping devices, such as e-cigarettes. If you need help quitting, ask your health care provider. Monitor your blood pressure at home as told by your health care provider. Keep all follow-up visits. This is  important. Medicines Take over-the-counter and prescription medicines only as told by your health care provider. Follow directions carefully. Blood pressure medicines must be taken as prescribed. Do not skip doses of blood pressure medicine. Doing this puts you at risk for problems and can make the medicine less effective. Ask your health care provider about side effects or reactions to medicines that you should watch for. Contact a health care provider if you: Think you are having a reaction to a medicine you are taking. Have headaches that keep coming back (recurring). Feel dizzy. Have swelling in your ankles. Have trouble with your vision. Get help right away if you: Develop a severe headache or confusion. Have unusual weakness or numbness. Feel faint. Have severe pain in your chest or abdomen. Vomit repeatedly. Have trouble breathing. These symptoms may be an emergency. Get help right away. Call 911. Do not wait to see if the symptoms will go away. Do not drive yourself to the hospital. Summary Hypertension is when the force of blood pumping through your arteries is too strong. If this condition is not controlled, it may put you at risk for serious complications. Your personal target blood pressure may vary depending on your medical conditions, your age, and other factors. For most people, a normal blood pressure is less than 120/80.  Hypertension is treated with lifestyle changes, medicines, or a combination of both. Lifestyle changes include losing weight, eating a healthy, low-sodium diet, exercising more, and limiting alcohol. This information is not intended to replace advice given to you by your health care provider. Make sure you discuss any questions you have with your health care provider. Document Revised: 05/18/2021 Document Reviewed: 05/18/2021 Elsevier Patient Education  [place].

## 2022-05-10 NOTE — Assessment & Plan Note (Signed)
Uncontrolled hypertension. Continue amlodipine 5 mg daily. Stop hydrochlorothiazide 25 mg Started valsartan HCTZ 160-12.5 mg daily. Cardiovascular risks associated with uncontrolled hypertension discussed. Diet approaches to stop hypertension discussed.

## 2022-05-10 NOTE — Assessment & Plan Note (Signed)
Stable.  Hemoglobin A1c done today. Diet and noted patient discussed.

## 2022-05-10 NOTE — Assessment & Plan Note (Addendum)
Work restrictions recommended. Work note printed. Avoid prolonged standing and avoid heavy lifting, pulling or pushing. May take Tylenol.  Avoid NSAIDs.

## 2022-05-10 NOTE — Assessment & Plan Note (Signed)
Cardiovascular and cancer risk associated with smoking discussed.  Contributing to elevated blood pressure readings. Smoking cessation advice given.

## 2022-05-11 ENCOUNTER — Other Ambulatory Visit: Payer: Self-pay | Admitting: Emergency Medicine

## 2022-05-11 DIAGNOSIS — E119 Type 2 diabetes mellitus without complications: Secondary | ICD-10-CM

## 2022-05-11 MED ORDER — EMPAGLIFLOZIN 10 MG PO TABS: 10.0000 mg | ORAL_TABLET | Freq: Every day | ORAL | 1 refills | Status: DC

## 2022-08-10 ENCOUNTER — Ambulatory Visit (INDEPENDENT_AMBULATORY_CARE_PROVIDER_SITE_OTHER): Payer: Medicare HMO | Admitting: Emergency Medicine

## 2022-08-10 ENCOUNTER — Encounter: Payer: Self-pay | Admitting: Emergency Medicine

## 2022-08-10 VITALS — BP 148/86 | HR 60 | Temp 98.0°F | Ht 70.0 in | Wt 207.5 lb

## 2022-08-10 DIAGNOSIS — E785 Hyperlipidemia, unspecified: Secondary | ICD-10-CM | POA: Diagnosis not present

## 2022-08-10 DIAGNOSIS — I152 Hypertension secondary to endocrine disorders: Secondary | ICD-10-CM | POA: Diagnosis not present

## 2022-08-10 DIAGNOSIS — F17200 Nicotine dependence, unspecified, uncomplicated: Secondary | ICD-10-CM

## 2022-08-10 DIAGNOSIS — E119 Type 2 diabetes mellitus without complications: Secondary | ICD-10-CM

## 2022-08-10 DIAGNOSIS — I10 Essential (primary) hypertension: Secondary | ICD-10-CM | POA: Diagnosis not present

## 2022-08-10 DIAGNOSIS — E1169 Type 2 diabetes mellitus with other specified complication: Secondary | ICD-10-CM | POA: Insufficient documentation

## 2022-08-10 DIAGNOSIS — E1159 Type 2 diabetes mellitus with other circulatory complications: Secondary | ICD-10-CM

## 2022-08-10 DIAGNOSIS — R69 Illness, unspecified: Secondary | ICD-10-CM | POA: Diagnosis not present

## 2022-08-10 DIAGNOSIS — F17210 Nicotine dependence, cigarettes, uncomplicated: Secondary | ICD-10-CM | POA: Diagnosis not present

## 2022-08-10 MED ORDER — AMLODIPINE BESYLATE 5 MG PO TABS: 5.0000 mg | ORAL_TABLET | Freq: Every day | ORAL | 3 refills | Status: DC

## 2022-08-10 MED ORDER — EMPAGLIFLOZIN 10 MG PO TABS: 10.0000 mg | ORAL_TABLET | Freq: Every day | ORAL | 1 refills | Status: AC

## 2022-08-10 NOTE — Patient Instructions (Signed)
Hypertension, Adult High blood pressure (hypertension) is when the force of blood pumping through the arteries is too strong. The arteries are the blood vessels that carry blood from the heart throughout the body. Hypertension forces the heart to work harder to pump blood and may cause arteries to become narrow or stiff. Untreated or uncontrolled hypertension can lead to a heart attack, heart failure, a stroke, kidney disease, and other problems. A blood pressure reading consists of a higher number over a lower number. Ideally, your blood pressure should be below 120/80. The first ("top") number is called the systolic pressure. It is a measure of the pressure in your arteries as your heart beats. The second ("bottom") number is called the diastolic pressure. It is a measure of the pressure in your arteries as the heart relaxes. What are the causes? The exact cause of this condition is not known. There are some conditions that result in high blood pressure. What increases the risk? Certain factors may make you more likely to develop high blood pressure. Some of these risk factors are under your control, including: Smoking. Not getting enough exercise or physical activity. Being overweight. Having too much fat, sugar, calories, or salt (sodium) in your diet. Drinking too much alcohol. Other risk factors include: Having a personal history of heart disease, diabetes, high cholesterol, or kidney disease. Stress. Having a family history of high blood pressure and high cholesterol. Having obstructive sleep apnea. Age. The risk increases with age. What are the signs or symptoms? High blood pressure may not cause symptoms. Very high blood pressure (hypertensive crisis) may cause: Headache. Fast or irregular heartbeats (palpitations). Shortness of breath. Nosebleed. Nausea and vomiting. Vision changes. Severe chest pain, dizziness, and seizures. How is this diagnosed? This condition is diagnosed by  measuring your blood pressure while you are seated, with your arm resting on a flat surface, your legs uncrossed, and your feet flat on the floor. The cuff of the blood pressure monitor will be placed directly against the skin of your upper arm at the level of your heart. Blood pressure should be measured at least twice using the same arm. Certain conditions can cause a difference in blood pressure between your right and left arms. If you have a high blood pressure reading during one visit or you have normal blood pressure with other risk factors, you may be asked to: Return on a different day to have your blood pressure checked again. Monitor your blood pressure at home for 1 week or longer. If you are diagnosed with hypertension, you may have other blood or imaging tests to help your health care provider understand your overall risk for other conditions. How is this treated? This condition is treated by making healthy lifestyle changes, such as eating healthy foods, exercising more, and reducing your alcohol intake. You may be referred for counseling on a healthy diet and physical activity. Your health care provider may prescribe medicine if lifestyle changes are not enough to get your blood pressure under control and if: Your systolic blood pressure is above 130. Your diastolic blood pressure is above 80. Your personal target blood pressure may vary depending on your medical conditions, your age, and other factors. Follow these instructions at home: Eating and drinking  Eat a diet that is high in fiber and potassium, and low in sodium, added sugar, and fat. An example of this eating plan is called the DASH diet. DASH stands for Dietary Approaches to Stop Hypertension. To eat this way: Eat   plenty of fresh fruits and vegetables. Try to fill one half of your plate at each meal with fruits and vegetables. Eat whole grains, such as whole-wheat pasta, brown rice, or whole-grain bread. Fill about one  fourth of your plate with whole grains. Eat or drink low-fat dairy products, such as skim milk or low-fat yogurt. Avoid fatty cuts of meat, processed or cured meats, and poultry with skin. Fill about one fourth of your plate with lean proteins, such as fish, chicken without skin, beans, eggs, or tofu. Avoid pre-made and processed foods. These tend to be higher in sodium, added sugar, and fat. Reduce your daily sodium intake. Many people with hypertension should eat less than 1,500 mg of sodium a day. Do not drink alcohol if: Your health care provider tells you not to drink. You are pregnant, may be pregnant, or are planning to become pregnant. If you drink alcohol: Limit how much you have to: 0-1 drink a day for women. 0-2 drinks a day for men. Know how much alcohol is in your drink. In the [place] one drink equals one 12 oz bottle of beer (355 mL), one 5 oz glass of wine (148 mL), or one 1 oz glass of hard liquor (44 mL). Lifestyle  Work with your health care provider to maintain a healthy body weight or to lose weight. Ask what an ideal weight is for you. Get at least 30 minutes of exercise that causes your heart to beat faster (aerobic exercise) most days of the week. Activities may include walking, swimming, or biking. Include exercise to strengthen your muscles (resistance exercise), such as Pilates or lifting weights, as part of your weekly exercise routine. Try to do these types of exercises for 30 minutes at least 3 days a week. Do not use any products that contain nicotine or tobacco. These products include cigarettes, chewing tobacco, and vaping devices, such as e-cigarettes. If you need help quitting, ask your health care provider. Monitor your blood pressure at home as told by your health care provider. Keep all follow-up visits. This is important. Medicines Take over-the-counter and prescription medicines only as told by your health care provider. Follow directions carefully. Blood  pressure medicines must be taken as prescribed. Do not skip doses of blood pressure medicine. Doing this puts you at risk for problems and can make the medicine less effective. Ask your health care provider about side effects or reactions to medicines that you should watch for. Contact a health care provider if you: Think you are having a reaction to a medicine you are taking. Have headaches that keep coming back (recurring). Feel dizzy. Have swelling in your ankles. Have trouble with your vision. Get help right away if you: Develop a severe headache or confusion. Have unusual weakness or numbness. Feel faint. Have severe pain in your chest or abdomen. Vomit repeatedly. Have trouble breathing. These symptoms may be an emergency. Get help right away. Call 911. Do not wait to see if the symptoms will go away. Do not drive yourself to the hospital. Summary Hypertension is when the force of blood pumping through your arteries is too strong. If this condition is not controlled, it may put you at risk for serious complications. Your personal target blood pressure may vary depending on your medical conditions, your age, and other factors. For most people, a normal blood pressure is less than 120/80. Hypertension is treated with lifestyle changes, medicines, or a combination of both. Lifestyle changes include losing weight, eating a healthy,   low-sodium diet, exercising more, and limiting alcohol. This information is not intended to replace advice given to you by your health care provider. Make sure you discuss any questions you have with your health care provider. Document Revised: 05/18/2021 Document Reviewed: 05/18/2021 Elsevier Patient Education  2023 [employer]  

## 2022-08-10 NOTE — Progress Notes (Signed)
David Jacobs 72 y.o.   Chief Complaint  Patient presents with   Follow-up    3 mnth f/u appt concerned about his BP    HISTORY OF PRESENT ILLNESS: This is a 72 y.o. male here for 6-monthfollow-up of hypertension and diabetes. Was confused about proper dosing.  Not taking medications as prescribed. Did not start Jardiance.  Not taking amlodipine Still smoking. BP Readings from Last 3 Encounters:  08/10/22 (!) 148/86  05/10/22 (!) 160/88  10/20/21 130/70     HPI   Prior to Admission medications   Medication Sig Start Date End Date Taking? Authorizing Provider  amLODipine (NORVASC) 5 MG tablet Take 1 tablet (5 mg total) by mouth daily. 05/10/22 05/05/23 Yes David Jacobs, David Bloomer Jacobs  empagliflozin (JARDIANCE) 10 MG TABS tablet Take 1 tablet (10 mg total) by mouth daily before breakfast. 05/11/22  Yes David Jacobs, David Bloomer Jacobs  rosuvastatin (CRESTOR) 20 MG tablet Take 1 tablet (20 mg total) by mouth daily. 05/10/22 05/05/23 Yes David Jacobs, David Bloomer Jacobs  valsartan-hydrochlorothiazide (DIOVAN-HCT) 160-12.5 MG tablet Take 1 tablet by mouth daily. 05/10/22  Yes David Pollen Jacobs    No Known Allergies  Patient Active Problem List   Diagnosis Date Noted   Current smoker 05/10/2022   Musculoskeletal back pain 10/20/2021   Atherosclerosis of aorta ([facility] 04/27/2020   Dyslipidemia 03/30/2018   Diet-controlled diabetes mellitus ([facility] 03/30/2018   [place] 12/07/2015   Essential hypertension 11/15/2015    Past Medical History:  Diagnosis Date   Hypertension     Past Surgical History:  Procedure Laterality Date   EUS N/A 12/09/2015   Procedure: LOWER ENDOSCOPIC ULTRASOUND (EUS);  Surgeon: David Jacobs;  Location: [facility];  Service: Endoscopy;  Laterality: N/A;   XI ROBOTIC ASSISTED LOWER ANTERIOR RESECTION N/A 03/25/2016   Procedure: XI ROBOTIC ASSISTED LOWER ANTERIOR RESECTION;  Surgeon: ALeighton Ruff Jacobs;  Location: [facility];  Service: General;   Laterality: N/A;    Social History   Socioeconomic History   Marital status: Married    Spouse name: Not on file   Number of children: 2   Years of education: Not on file   Highest education level: Not on file  Occupational History   Not on file  Tobacco Use   Smoking status: Every Day    Packs/day: 0.50    Years: 35.00    Total pack years: 17.50    Types: Cigarettes   Smokeless tobacco: Never  Substance and Sexual Activity   Alcohol use: No    Alcohol/week: 2.0 - 3.0 standard drinks of alcohol    Types: 2 - 3 Cans of beer per week   Drug use: No   Sexual activity: Not on file  Other Topics Concern   Not on file  Social History Narrative   Married, lives w/his wife and a son--minimal English   Works full time at [employer]  Current everyday smoker, with no plans to quit   Primary contact is daughter-in-law, David Jacobs(can interpret for him)   Social Determinants of Health   Financial Resource Strain: Not on file  Food Insecurity: Not on file  Transportation Needs: Not on file  Physical Activity: Not on file  Stress: Not on file  Social Connections: Not on file  Intimate Partner Violence: Not on file    Family History  Problem Relation Age of Onset   Cancer Mother 656      throid cancer    Hyperlipidemia  Mother    Diabetes Father      Review of Systems  Constitutional: Negative.  Negative for chills and fever.  HENT: Negative.  Negative for congestion and sore throat.   Respiratory: Negative.  Negative for cough and shortness of breath.   Cardiovascular: Negative.  Negative for chest pain and palpitations.  Gastrointestinal: Negative.  Negative for abdominal pain, diarrhea, nausea and vomiting.  Genitourinary: Negative.  Negative for dysuria and hematuria.  Skin: Negative.  Negative for rash.  Neurological: Negative.  Negative for dizziness and headaches.  All other systems reviewed and are negative.  Today's Vitals   08/10/22 1440  BP:  (!) 148/86  Pulse: 60  Temp: 98 F (36.7 C)  TempSrc: Oral  SpO2: 98%  Weight: 207 lb 8 oz (94.1 kg)  Height: '5\' 10"'$  (1.778 m)   Body mass index is 29.77 kg/m.   Physical Exam Vitals reviewed.  Constitutional:      Appearance: Normal appearance.  HENT:     Head: Normocephalic.  Eyes:     Extraocular Movements: Extraocular movements intact.     Pupils: Pupils are equal, round, and reactive to light.  Cardiovascular:     Rate and Rhythm: Normal rate and regular rhythm.     Pulses: Normal pulses.     Heart sounds: Normal heart sounds.  Pulmonary:     Effort: Pulmonary effort is normal.     Breath sounds: Normal breath sounds.  Musculoskeletal:     Cervical back: No tenderness.  Lymphadenopathy:     Cervical: No cervical adenopathy.  Skin:    General: Skin is warm and dry.  Neurological:     General: No focal deficit present.     Mental Status: He is alert and oriented to person, place, and time.  Psychiatric:        Mood and Affect: Mood normal.        Behavior: Behavior normal.      ASSESSMENT & PLAN: A total of 47 minutes was spent with the patient and counseling/coordination of care regarding preparing for this visit, review of most recent office visit notes, review of most recent blood work results, review of multiple chronic medical conditions and their management, review of all medications and changes made, cardiovascular risks associated with hypertension and diabetes, education on nutrition, smoking cessation advice, prognosis, documentation, and need for follow-up in 3 months  Problem List Items Addressed This Visit       Cardiovascular and Mediastinum   Hypertension associated with diabetes ([place]) - Primary    Still elevated readings in the office Not taking amlodipine.  Misunderstanding. Recommend to restart amlodipine 5 mg and continue valsartan hydrochlorothiazide 160-12.5 mg daily. Last hemoglobin A1c at 7.3.  Did not start medication as  prescribed. Recommend to start Jardiance 10 mg daily. Cardiovascular risks associated with hypertension and diabetes discussed. Follow-up in 3 months. Diet and nutrition discussed.       Relevant Medications   amLODipine (NORVASC) 5 MG tablet   empagliflozin (JARDIANCE) 10 MG TABS tablet     Endocrine   Diet-controlled diabetes mellitus (HCC)   Relevant Medications   empagliflozin (JARDIANCE) 10 MG TABS tablet   Dyslipidemia associated with type 2 diabetes mellitus (HCC)    Stable.  Diet and nutrition discussed. Continue rosuvastatin 20 mg daily. Cardiovascular risks associated with dyslipidemia discussed.      Relevant Medications   empagliflozin (JARDIANCE) 10 MG TABS tablet     Other   Current smoker  Still smoking with no desire to quit. Cardiovascular and cancer risk associated with smoking discussed.  Smoking cessation advice given.      Patient Instructions  Hypertension, Adult High blood pressure (hypertension) is when the force of blood pumping through the arteries is too strong. The arteries are the blood vessels that carry blood from the heart throughout the body. Hypertension forces the heart to work harder to pump blood and may cause arteries to become narrow or stiff. Untreated or uncontrolled hypertension can lead to a heart attack, heart failure, a stroke, kidney disease, and other problems. A blood pressure reading consists of a higher number over a lower number. Ideally, your blood pressure should be below 120/80. The first ("top") number is called the systolic pressure. It is a measure of the pressure in your arteries as your heart beats. The second ("bottom") number is called the diastolic pressure. It is a measure of the pressure in your arteries as the heart relaxes. What are the causes? The exact cause of this condition is not known. There are some conditions that result in high blood pressure. What increases the risk? Certain factors may make you more  likely to develop high blood pressure. Some of these risk factors are under your control, including: Smoking. Not getting enough exercise or physical activity. Being overweight. Having too much fat, sugar, calories, or salt (sodium) in your diet. Drinking too much alcohol. Other risk factors include: Having a personal history of heart disease, diabetes, high cholesterol, or kidney disease. Stress. Having a family history of high blood pressure and high cholesterol. Having obstructive sleep apnea. Age. The risk increases with age. What are the signs or symptoms? High blood pressure may not cause symptoms. Very high blood pressure (hypertensive crisis) may cause: Headache. Fast or irregular heartbeats (palpitations). Shortness of breath. Nosebleed. Nausea and vomiting. Vision changes. Severe chest pain, dizziness, and seizures. How is this diagnosed? This condition is diagnosed by measuring your blood pressure while you are seated, with your arm resting on a flat surface, your legs uncrossed, and your feet flat on the floor. The cuff of the blood pressure monitor will be placed directly against the skin of your upper arm at the level of your heart. Blood pressure should be measured at least twice using the same arm. Certain conditions can cause a difference in blood pressure between your right and left arms. If you have a high blood pressure reading during one visit or you have normal blood pressure with other risk factors, you may be asked to: Return on a different day to have your blood pressure checked again. Monitor your blood pressure at home for 1 week or longer. If you are diagnosed with hypertension, you may have other blood or imaging tests to help your health care provider understand your overall risk for other conditions. How is this treated? This condition is treated by making healthy lifestyle changes, such as eating healthy foods, exercising more, and reducing your alcohol  intake. You may be referred for counseling on a healthy diet and physical activity. Your health care provider may prescribe medicine if lifestyle changes are not enough to get your blood pressure under control and if: Your systolic blood pressure is above 130. Your diastolic blood pressure is above 80. Your personal target blood pressure may vary depending on your medical conditions, your age, and other factors. Follow these instructions at home: Eating and drinking  Eat a diet that is high in fiber and potassium, and low in  sodium, added sugar, and fat. An example of this eating plan is called the DASH diet. DASH stands for Dietary Approaches to Stop Hypertension. To eat this way: Eat plenty of fresh fruits and vegetables. Try to fill one half of your plate at each meal with fruits and vegetables. Eat whole grains, such as whole-wheat pasta, brown rice, or whole-grain bread. Fill about one fourth of your plate with whole grains. Eat or drink low-fat dairy products, such as skim milk or low-fat yogurt. Avoid fatty cuts of meat, processed or cured meats, and poultry with skin. Fill about one fourth of your plate with lean proteins, such as fish, chicken without skin, beans, eggs, or tofu. Avoid pre-made and processed foods. These tend to be higher in sodium, added sugar, and fat. Reduce your daily sodium intake. Many people with hypertension should eat less than 1,500 mg of sodium a day. Do not drink alcohol if: Your health care provider tells you not to drink. You are pregnant, may be pregnant, or are planning to become pregnant. If you drink alcohol: Limit how much you have to: 0-1 drink a day for women. 0-2 drinks a day for men. Know how much alcohol is in your drink. In the [place] one drink equals one 12 oz bottle of beer (355 mL), one 5 oz glass of wine (148 mL), or one 1 oz glass of hard liquor (44 mL). Lifestyle  Work with your health care provider to maintain a healthy body weight or  to lose weight. Ask what an ideal weight is for you. Get at least 30 minutes of exercise that causes your heart to beat faster (aerobic exercise) most days of the week. Activities may include walking, swimming, or biking. Include exercise to strengthen your muscles (resistance exercise), such as Pilates or lifting weights, as part of your weekly exercise routine. Try to do these types of exercises for 30 minutes at least 3 days a week. Do not use any products that contain nicotine or tobacco. These products include cigarettes, chewing tobacco, and vaping devices, such as e-cigarettes. If you need help quitting, ask your health care provider. Monitor your blood pressure at home as told by your health care provider. Keep all follow-up visits. This is important. Medicines Take over-the-counter and prescription medicines only as told by your health care provider. Follow directions carefully. Blood pressure medicines must be taken as prescribed. Do not skip doses of blood pressure medicine. Doing this puts you at risk for problems and can make the medicine less effective. Ask your health care provider about side effects or reactions to medicines that you should watch for. Contact a health care provider if you: Think you are having a reaction to a medicine you are taking. Have headaches that keep coming back (recurring). Feel dizzy. Have swelling in your ankles. Have trouble with your vision. Get help right away if you: Develop a severe headache or confusion. Have unusual weakness or numbness. Feel faint. Have severe pain in your chest or abdomen. Vomit repeatedly. Have trouble breathing. These symptoms may be an emergency. Get help right away. Call 911. Do not wait to see if the symptoms will go away. Do not drive yourself to the hospital. Summary Hypertension is when the force of blood pumping through your arteries is too strong. If this condition is not controlled, it may put you at risk for  serious complications. Your personal target blood pressure may vary depending on your medical conditions, your age, and other factors. For most  people, a normal blood pressure is less than 120/80. Hypertension is treated with lifestyle changes, medicines, or a combination of both. Lifestyle changes include losing weight, eating a healthy, low-sodium diet, exercising more, and limiting alcohol. This information is not intended to replace advice given to you by your health care provider. Make sure you discuss any questions you have with your health care provider. Document Revised: 05/18/2021 Document Reviewed: 05/18/2021 Elsevier Patient Education  [place], [facility]

## 2022-08-10 NOTE — Assessment & Plan Note (Signed)
Stable.  Diet and nutrition discussed. Continue rosuvastatin 20 mg daily. Cardiovascular risks associated with dyslipidemia discussed.

## 2022-08-10 NOTE — Assessment & Plan Note (Signed)
Still smoking with no desire to quit. Cardiovascular and cancer risk associated with smoking discussed.  Smoking cessation advice given.

## 2022-08-10 NOTE — Assessment & Plan Note (Addendum)
Still elevated readings in the office Not taking amlodipine.  Misunderstanding. Recommend to restart amlodipine 5 mg and continue valsartan hydrochlorothiazide 160-12.5 mg daily. Last hemoglobin A1c at 7.3.  Did not start medication as prescribed. Recommend to start Jardiance 10 mg daily. Cardiovascular risks associated with hypertension and diabetes discussed. Follow-up in 3 months. Diet and nutrition discussed.

## 2022-08-29 ENCOUNTER — Telehealth: Payer: Self-pay

## 2022-08-29 NOTE — Telephone Encounter (Signed)
Left message for patient to call back to schedule Medicare Annual Wellness Visit.  No hx of AWV eligible as of 12/23/20  Please schedule at anytime with [facility] if patient calls the office back.    30 minute appointment.  Any questions, please call me at [phone]

## 2022-10-12 DIAGNOSIS — E860 Dehydration: Secondary | ICD-10-CM | POA: Diagnosis not present

## 2022-11-10 ENCOUNTER — Encounter: Payer: Self-pay | Admitting: Emergency Medicine

## 2022-11-10 ENCOUNTER — Ambulatory Visit (INDEPENDENT_AMBULATORY_CARE_PROVIDER_SITE_OTHER): Payer: Medicare HMO | Admitting: Emergency Medicine

## 2022-11-10 VITALS — BP 128/68 | HR 56 | Temp 98.6°F | Ht 70.0 in | Wt 204.0 lb

## 2022-11-10 DIAGNOSIS — E785 Hyperlipidemia, unspecified: Secondary | ICD-10-CM | POA: Diagnosis not present

## 2022-11-10 DIAGNOSIS — I152 Hypertension secondary to endocrine disorders: Secondary | ICD-10-CM

## 2022-11-10 DIAGNOSIS — C20 Malignant neoplasm of rectum: Secondary | ICD-10-CM

## 2022-11-10 DIAGNOSIS — F17200 Nicotine dependence, unspecified, uncomplicated: Secondary | ICD-10-CM | POA: Diagnosis not present

## 2022-11-10 DIAGNOSIS — E1159 Type 2 diabetes mellitus with other circulatory complications: Secondary | ICD-10-CM | POA: Diagnosis not present

## 2022-11-10 DIAGNOSIS — E1169 Type 2 diabetes mellitus with other specified complication: Secondary | ICD-10-CM

## 2022-11-10 DIAGNOSIS — I700 Atherosclerosis of aorta: Secondary | ICD-10-CM | POA: Diagnosis not present

## 2022-11-10 DIAGNOSIS — R69 Illness, unspecified: Secondary | ICD-10-CM | POA: Diagnosis not present

## 2022-11-10 LAB — POCT GLYCOSYLATED HEMOGLOBIN (HGB A1C): Hemoglobin A1C: 6.4 % — AB (ref 4.0–5.6)

## 2022-11-10 NOTE — Assessment & Plan Note (Signed)
Cardiovascular/cancer risks associated with smoking discussed Smoking cessation advice given Not interested in quitting

## 2022-11-10 NOTE — Assessment & Plan Note (Signed)
History of rectal cancer.  No concerns.

## 2022-11-10 NOTE — Assessment & Plan Note (Signed)
BP Readings from Last 3 Encounters:  11/10/22 128/68  08/10/22 (!) 148/86  05/10/22 (!) 160/88  Well-controlled hypertension Continue amlodipine 5 mg and Diovan HCT 160-12.5 mg daily. Well-controlled diabetes with hemoglobin A1c of 6.4 Not taking Jardiance or any other medication. Cardiovascular risks associated with hypertension and diabetes discussed Diet and nutrition discussed Follow-up in 6 months.

## 2022-11-10 NOTE — Patient Instructions (Signed)
Health Maintenance After Age 73 After age 73, you are at a higher risk for certain long-term diseases and infections as well as injuries from falls. Falls are a major cause of broken bones and head injuries in people who are older than age 73. Getting regular preventive care can help to keep you healthy and well. Preventive care includes getting regular testing and making lifestyle changes as recommended by your health care provider. Talk with your health care provider about: Which screenings and tests you should have. A screening is a test that checks for a disease when you have no symptoms. A diet and exercise plan that is right for you. What should I know about screenings and tests to prevent falls? Screening and testing are the best ways to find a health problem early. Early diagnosis and treatment give you the best chance of managing medical conditions that are common after age 73. Certain conditions and lifestyle choices may make you more likely to have a fall. Your health care provider may recommend: Regular vision checks. Poor vision and conditions such as cataracts can make you more likely to have a fall. If you wear glasses, make sure to get your prescription updated if your vision changes. Medicine review. Work with your health care provider to regularly review all of the medicines you are taking, including over-the-counter medicines. Ask your health care provider about any side effects that may make you more likely to have a fall. Tell your health care provider if any medicines that you take make you feel dizzy or sleepy. Strength and balance checks. Your health care provider may recommend certain tests to check your strength and balance while standing, walking, or changing positions. Foot health exam. Foot pain and numbness, as well as not wearing proper footwear, can make you more likely to have a fall. Screenings, including: Osteoporosis screening. Osteoporosis is a condition that causes  the bones to get weaker and break more easily. Blood pressure screening. Blood pressure changes and medicines to control blood pressure can make you feel dizzy. Depression screening. You may be more likely to have a fall if you have a fear of falling, feel depressed, or feel unable to do activities that you used to do. Alcohol use screening. Using too much alcohol can affect your balance and may make you more likely to have a fall. Follow these instructions at home: Lifestyle Do not drink alcohol if: Your health care provider tells you not to drink. If you drink alcohol: Limit how much you have to: 0-1 drink a day for women. 0-2 drinks a day for men. Know how much alcohol is in your drink. In the [place] one drink equals one 12 oz bottle of beer (355 mL), one 5 oz glass of wine (148 mL), or one 1 oz glass of hard liquor (44 mL). Do not use any products that contain nicotine or tobacco. These products include cigarettes, chewing tobacco, and vaping devices, such as e-cigarettes. If you need help quitting, ask your health care provider. Activity  Follow a regular exercise program to stay fit. This will help you maintain your balance. Ask your health care provider what types of exercise are appropriate for you. If you need a cane or walker, use it as recommended by your health care provider. Wear supportive shoes that have nonskid soles. Safety  Remove any tripping hazards, such as rugs, cords, and clutter. Install safety equipment such as grab bars in bathrooms and safety rails on stairs. Keep rooms and walkways   well-lit. General instructions Talk with your health care provider about your risks for falling. Tell your health care provider if: You fall. Be sure to tell your health care provider about all falls, even ones that seem minor. You feel dizzy, tiredness (fatigue), or off-balance. Take over-the-counter and prescription medicines only as told by your health care provider. These include  supplements. Eat a healthy diet and maintain a healthy weight. A healthy diet includes low-fat dairy products, low-fat (lean) meats, and fiber from whole grains, beans, and lots of fruits and vegetables. Stay current with your vaccines. Schedule regular health, dental, and eye exams. Summary Having a healthy lifestyle and getting preventive care can help to protect your health and wellness after age 73. Screening and testing are the best way to find a health problem early and help you avoid having a fall. Early diagnosis and treatment give you the best chance for managing medical conditions that are more common for people who are older than age 73. Falls are a major cause of broken bones and head injuries in people who are older than age 73. Take precautions to prevent a fall at home. Work with your health care provider to learn what changes you can make to improve your health and wellness and to prevent falls. This information is not intended to replace advice given to you by your health care provider. Make sure you discuss any questions you have with your health care provider. Document Revised: 11/30/2020 Document Reviewed: 11/30/2020 Elsevier Patient Education  2023 [employer]  

## 2022-11-10 NOTE — Assessment & Plan Note (Signed)
Continues rosuvastatin 20 mg daily

## 2022-11-10 NOTE — Progress Notes (Signed)
Ravindra Calma 72 y.o.   Chief Complaint  Patient presents with   Medical Management of Chronic Issues    3 month f/u for DM. Pt hasn't started jardiance because it kept getting sent to the wrong pharmacy, she states that was the issue last time.     HISTORY OF PRESENT ILLNESS: This is a 72 y.o. male here for 56-month follow-up of diabetes and hypertension Doing well.  Has no complaints or medical concerns Accompanied by daughter who is helping with interpretation Patient still smoking.  HPI   Prior to Admission medications   Medication Sig Start Date End Date Taking? Authorizing Provider  amLODipine (NORVASC) 5 MG tablet Take 1 tablet (5 mg total) by mouth daily. 08/10/22 08/05/23 Yes Darien Kading, Eilleen Kempf, MD  rosuvastatin (CRESTOR) 20 MG tablet Take 1 tablet (20 mg total) by mouth daily. 05/10/22 05/05/23 Yes Iyanna Drummer, Eilleen Kempf, MD  valsartan-hydrochlorothiazide (DIOVAN-HCT) 160-12.5 MG tablet Take 1 tablet by mouth daily. 05/10/22  Yes Emilia Kayes, Eilleen Kempf, MD  empagliflozin (JARDIANCE) 10 MG TABS tablet Take 1 tablet (10 mg total) by mouth daily before breakfast. Patient not taking: Reported on 11/10/2022 08/10/22   Georgina Quint, MD    No Known Allergies  Patient Active Problem List   Diagnosis Date Noted   Dyslipidemia associated with type 2 diabetes mellitus 08/10/2022   Current smoker 05/10/2022   Atherosclerosis of aorta 04/27/2020   Dyslipidemia 03/30/2018   Diet-controlled diabetes mellitus 03/30/2018   Rectal cancer 12/07/2015   Hypertension associated with diabetes 11/15/2015    Past Medical History:  Diagnosis Date   Hypertension     Past Surgical History:  Procedure Laterality Date   EUS N/A 12/09/2015   Procedure: LOWER ENDOSCOPIC ULTRASOUND (EUS);  Surgeon: Willis Modena, MD;  Location: [facility];  Service: Endoscopy;  Laterality: N/A;   XI ROBOTIC ASSISTED LOWER ANTERIOR RESECTION N/A 03/25/2016   Procedure: XI ROBOTIC ASSISTED LOWER  ANTERIOR RESECTION;  Surgeon: Romie Levee, MD;  Location: [facility];  Service: General;  Laterality: N/A;    Social History   Socioeconomic History   Marital status: Married    Spouse name: Not on file   Number of children: 2   Years of education: Not on file   Highest education level: Not on file  Occupational History   Not on file  Tobacco Use   Smoking status: Every Day    Packs/day: 0.50    Years: 35.00    Additional pack years: 0.00    Total pack years: 17.50    Types: Cigarettes   Smokeless tobacco: Never  Substance and Sexual Activity   Alcohol use: No    Alcohol/week: 2.0 - 3.0 standard drinks of alcohol    Types: 2 - 3 Cans of beer per week   Drug use: No   Sexual activity: Not on file  Other Topics Concern   Not on file  Social History Narrative   Married, lives w/his wife and a son--minimal English   Works full time at [employer]   Current everyday smoker, with no plans to quit   Primary contact is daughter-in-law, Tarrell Debes (can interpret for him)   Social Determinants of Health   Financial Resource Strain: Not on file  Food Insecurity: Not on file  Transportation Needs: Not on file  Physical Activity: Not on file  Stress: Not on file  Social Connections: Not on file  Intimate Partner Violence: Not on file    Family History  Problem Relation Age of  Onset   Cancer Mother 46       throid cancer    Hyperlipidemia Mother    Diabetes Father      Review of Systems  Constitutional: Negative.  Negative for chills and fever.  HENT: Negative.  Negative for congestion and sore throat.   Respiratory: Negative.  Negative for cough and shortness of breath.   Cardiovascular: Negative.  Negative for chest pain.  Gastrointestinal:  Negative for abdominal pain, nausea and vomiting.  Genitourinary: Negative.  Negative for dysuria and hematuria.  Skin: Negative.  Negative for rash.  Neurological: Negative.  Negative for dizziness and headaches.   All other systems reviewed and are negative.   Vitals:   11/10/22 1106  BP: 128/68  Pulse: (!) 56  Temp: 98.6 F (37 C)  SpO2: 97%    Physical Exam Vitals reviewed.  Constitutional:      Appearance: Normal appearance.  HENT:     Head: Normocephalic.     Mouth/Throat:     Mouth: Mucous membranes are moist.     Pharynx: Oropharynx is clear.  Eyes:     Extraocular Movements: Extraocular movements intact.     Conjunctiva/sclera: Conjunctivae normal.     Pupils: Pupils are equal, round, and reactive to light.  Cardiovascular:     Rate and Rhythm: Normal rate and regular rhythm.     Pulses: Normal pulses.     Heart sounds: Normal heart sounds.  Pulmonary:     Effort: Pulmonary effort is normal.     Breath sounds: Normal breath sounds.  Musculoskeletal:     Cervical back: No tenderness.  Lymphadenopathy:     Cervical: No cervical adenopathy.  Skin:    General: Skin is warm and dry.     Capillary Refill: Capillary refill takes less than 2 seconds.  Neurological:     General: No focal deficit present.     Mental Status: He is alert and oriented to person, place, and time.  Psychiatric:        Mood and Affect: Mood normal.        Behavior: Behavior normal.    Results for orders placed or performed in visit on 11/10/22 (from the past 24 hour(s))  POCT HgB A1C     Status: Abnormal   Collection Time: 11/10/22 11:25 AM  Result Value Ref Range   Hemoglobin A1C 6.4 (A) 4.0 - 5.6 %   HbA1c POC (<> result, manual entry)     HbA1c, POC (prediabetic range)     HbA1c, POC (controlled diabetic range)       ASSESSMENT & PLAN: A total of 44 minutes was spent with the patient and counseling/coordination of care regarding preparing for this visit, review of most recent office visit notes, review of most recent blood work results including interpretation of today's hemoglobin A1c, review of multiple chronic medical conditions and their management, review of all medications,  education on nutrition, smoking cessation advice, prognosis, documentation and need for follow-up.  Problem List Items Addressed This Visit       Cardiovascular and Mediastinum   Hypertension associated with diabetes - Primary    BP Readings from Last 3 Encounters:  11/10/22 128/68  08/10/22 (!) 148/86  05/10/22 (!) 160/88  Well-controlled hypertension Continue amlodipine 5 mg and Diovan HCT 160-12.5 mg daily. Well-controlled diabetes with hemoglobin A1c of 6.4 Not taking Jardiance or any other medication. Cardiovascular risks associated with hypertension and diabetes discussed Diet and nutrition discussed Follow-up in 6 months.  Relevant Orders   POCT HgB A1C (Completed)   Atherosclerosis of aorta    Continues rosuvastatin 20 mg daily        Digestive   Rectal cancer    History of rectal cancer.  No concerns.        Endocrine   Dyslipidemia associated with type 2 diabetes mellitus    Chronic stable condition Continue rosuvastatin 20 mg daily         Other   Current smoker    Cardiovascular/cancer risks associated with smoking discussed Smoking cessation advice given Not interested in quitting        Patient Instructions  Health Maintenance After Age 31 After age 45, you are at a higher risk for certain long-term diseases and infections as well as injuries from falls. Falls are a major cause of broken bones and head injuries in people who are older than age 41. Getting regular preventive care can help to keep you healthy and well. Preventive care includes getting regular testing and making lifestyle changes as recommended by your health care provider. Talk with your health care provider about: Which screenings and tests you should have. A screening is a test that checks for a disease when you have no symptoms. A diet and exercise plan that is right for you. What should I know about screenings and tests to prevent falls? Screening and testing are the best  ways to find a health problem early. Early diagnosis and treatment give you the best chance of managing medical conditions that are common after age 27. Certain conditions and lifestyle choices may make you more likely to have a fall. Your health care provider may recommend: Regular vision checks. Poor vision and conditions such as cataracts can make you more likely to have a fall. If you wear glasses, make sure to get your prescription updated if your vision changes. Medicine review. Work with your health care provider to regularly review all of the medicines you are taking, including over-the-counter medicines. Ask your health care provider about any side effects that may make you more likely to have a fall. Tell your health care provider if any medicines that you take make you feel dizzy or sleepy. Strength and balance checks. Your health care provider may recommend certain tests to check your strength and balance while standing, walking, or changing positions. Foot health exam. Foot pain and numbness, as well as not wearing proper footwear, can make you more likely to have a fall. Screenings, including: Osteoporosis screening. Osteoporosis is a condition that causes the bones to get weaker and break more easily. Blood pressure screening. Blood pressure changes and medicines to control blood pressure can make you feel dizzy. Depression screening. You may be more likely to have a fall if you have a fear of falling, feel depressed, or feel unable to do activities that you used to do. Alcohol use screening. Using too much alcohol can affect your balance and may make you more likely to have a fall. Follow these instructions at home: Lifestyle Do not drink alcohol if: Your health care provider tells you not to drink. If you drink alcohol: Limit how much you have to: 0-1 drink a day for women. 0-2 drinks a day for men. Know how much alcohol is in your drink. In the [place] one drink equals one 12 oz  bottle of beer (355 mL), one 5 oz glass of wine (148 mL), or one 1 oz glass of hard liquor (44 mL). Do not use  any products that contain nicotine or tobacco. These products include cigarettes, chewing tobacco, and vaping devices, such as e-cigarettes. If you need help quitting, ask your health care provider. Activity  Follow a regular exercise program to stay fit. This will help you maintain your balance. Ask your health care provider what types of exercise are appropriate for you. If you need a cane or walker, use it as recommended by your health care provider. Wear supportive shoes that have nonskid soles. Safety  Remove any tripping hazards, such as rugs, cords, and clutter. Install safety equipment such as grab bars in bathrooms and safety rails on stairs. Keep rooms and walkways well-lit. General instructions Talk with your health care provider about your risks for falling. Tell your health care provider if: You fall. Be sure to tell your health care provider about all falls, even ones that seem minor. You feel dizzy, tiredness (fatigue), or off-balance. Take over-the-counter and prescription medicines only as told by your health care provider. These include supplements. Eat a healthy diet and maintain a healthy weight. A healthy diet includes low-fat dairy products, low-fat (lean) meats, and fiber from whole grains, beans, and lots of fruits and vegetables. Stay current with your vaccines. Schedule regular health, dental, and eye exams. Summary Having a healthy lifestyle and getting preventive care can help to protect your health and wellness after age 27. Screening and testing are the best way to find a health problem early and help you avoid having a fall. Early diagnosis and treatment give you the best chance for managing medical conditions that are more common for people who are older than age 84. Falls are a major cause of broken bones and head injuries in people who are older than  age 84. Take precautions to prevent a fall at home. Work with your health care provider to learn what changes you can make to improve your health and wellness and to prevent falls. This information is not intended to replace advice given to you by your health care provider. Make sure you discuss any questions you have with your health care provider. Document Revised: 11/30/2020 Document Reviewed: 11/30/2020 Elsevier Patient Education  2023 [employer]     Edwina Barth, MD [facility]

## 2022-11-10 NOTE — Assessment & Plan Note (Signed)
Chronic stable condition. Continue rosuvastatin 20 mg daily.  

## 2023-04-28 ENCOUNTER — Other Ambulatory Visit: Payer: Self-pay | Admitting: Emergency Medicine

## 2023-04-28 DIAGNOSIS — E785 Hyperlipidemia, unspecified: Secondary | ICD-10-CM

## 2023-04-28 DIAGNOSIS — I10 Essential (primary) hypertension: Secondary | ICD-10-CM

## 2023-05-11 ENCOUNTER — Ambulatory Visit: Payer: Medicare HMO | Admitting: Emergency Medicine

## 2023-05-15 ENCOUNTER — Ambulatory Visit: Payer: Medicare HMO | Admitting: Emergency Medicine

## 2023-05-17 ENCOUNTER — Ambulatory Visit: Payer: Medicare HMO | Admitting: Emergency Medicine

## 2023-05-29 ENCOUNTER — Ambulatory Visit: Payer: Medicare HMO | Admitting: Emergency Medicine

## 2023-05-29 ENCOUNTER — Encounter: Payer: Self-pay | Admitting: Emergency Medicine

## 2023-05-29 VITALS — BP 144/72 | HR 60 | Temp 98.7°F | Ht 70.0 in | Wt 204.8 lb

## 2023-05-29 DIAGNOSIS — I152 Hypertension secondary to endocrine disorders: Secondary | ICD-10-CM

## 2023-05-29 DIAGNOSIS — E1159 Type 2 diabetes mellitus with other circulatory complications: Secondary | ICD-10-CM

## 2023-05-29 DIAGNOSIS — Z23 Encounter for immunization: Secondary | ICD-10-CM | POA: Diagnosis not present

## 2023-05-29 DIAGNOSIS — E1169 Type 2 diabetes mellitus with other specified complication: Secondary | ICD-10-CM

## 2023-05-29 DIAGNOSIS — E785 Hyperlipidemia, unspecified: Secondary | ICD-10-CM | POA: Diagnosis not present

## 2023-05-29 DIAGNOSIS — F17200 Nicotine dependence, unspecified, uncomplicated: Secondary | ICD-10-CM | POA: Diagnosis not present

## 2023-05-29 DIAGNOSIS — I700 Atherosclerosis of aorta: Secondary | ICD-10-CM | POA: Diagnosis not present

## 2023-05-29 DIAGNOSIS — I10 Essential (primary) hypertension: Secondary | ICD-10-CM | POA: Diagnosis not present

## 2023-05-29 LAB — LIPID PANEL
Cholesterol: 115 mg/dL (ref 0–200)
HDL: 40.7 mg/dL (ref >39.00)
LDL Cholesterol: 48 mg/dL (ref 0–99)
NonHDL: 74.44
Total CHOL/HDL Ratio: 3
Triglycerides: 132 mg/dL (ref 0.0–149.0)
VLDL: 26.4 mg/dL (ref 0.0–40.0)

## 2023-05-29 LAB — COMPREHENSIVE METABOLIC PANEL
ALT: 18 U/L (ref 0–53)
AST: 17 U/L (ref 0–37)
Albumin: 4.4 g/dL (ref 3.5–5.2)
Alkaline Phosphatase: 46 U/L (ref 39–117)
BUN: 13 mg/dL (ref 6–23)
CO2: 30 meq/L (ref 19–32)
Calcium: 9.6 mg/dL (ref 8.4–10.5)
Chloride: 104 meq/L (ref 96–112)
Creatinine, Ser: 0.99 mg/dL (ref 0.40–1.50)
GFR: 75.74 mL/min (ref >60.00)
Glucose, Bld: 110 mg/dL — ABNORMAL HIGH (ref 70–99)
Potassium: 4.3 meq/L (ref 3.5–5.1)
Sodium: 140 meq/L (ref 135–145)
Total Bilirubin: 0.5 mg/dL (ref 0.2–1.2)
Total Protein: 7.1 g/dL (ref 6.0–8.3)

## 2023-05-29 LAB — MICROALBUMIN / CREATININE URINE RATIO
Creatinine,U: 90.9 mg/dL
Microalb Creat Ratio: 0.8 mg/g (ref 0.0–30.0)
Microalb, Ur: <0.7 mg/dL (ref 0.0–1.9)

## 2023-05-29 LAB — CBC WITH DIFFERENTIAL/PLATELET
Basophils Absolute: 0.1 10*3/uL (ref 0.0–0.1)
Basophils Relative: 0.8 % (ref 0.0–3.0)
Eosinophils Absolute: 0.5 10*3/uL (ref 0.0–0.7)
Eosinophils Relative: 5.4 % — ABNORMAL HIGH (ref 0.0–5.0)
HCT: 48.8 % (ref 39.0–52.0)
Hemoglobin: 16 g/dL (ref 13.0–17.0)
Lymphocytes Relative: 32.6 % (ref 12.0–46.0)
Lymphs Abs: 3.2 10*3/uL (ref 0.7–4.0)
MCHC: 32.8 g/dL (ref 30.0–36.0)
MCV: 95.4 fL (ref 78.0–100.0)
Monocytes Absolute: 0.7 10*3/uL (ref 0.1–1.0)
Monocytes Relative: 6.7 % (ref 3.0–12.0)
Neutro Abs: 5.4 10*3/uL (ref 1.4–7.7)
Neutrophils Relative %: 54.5 % (ref 43.0–77.0)
Platelets: 278 10*3/uL (ref 150.0–400.0)
RBC: 5.11 Mil/uL (ref 4.22–5.81)
RDW: 13.6 % (ref 11.5–15.5)
WBC: 9.9 10*3/uL (ref 4.0–10.5)

## 2023-05-29 LAB — POCT GLYCOSYLATED HEMOGLOBIN (HGB A1C): Hemoglobin A1C: 6.6 % — AB (ref 4.0–5.6)

## 2023-05-29 MED ORDER — ROSUVASTATIN CALCIUM 20 MG PO TABS: 20.0000 mg | ORAL_TABLET | Freq: Every day | ORAL | 3 refills | Status: DC

## 2023-05-29 MED ORDER — VALSARTAN-HYDROCHLOROTHIAZIDE 160-12.5 MG PO TABS: 1.0000 | ORAL_TABLET | Freq: Every day | ORAL | 3 refills | Status: DC

## 2023-05-29 MED ORDER — AMLODIPINE BESYLATE 5 MG PO TABS: 5.0000 mg | ORAL_TABLET | Freq: Every day | ORAL | 3 refills | Status: DC

## 2023-05-29 NOTE — Assessment & Plan Note (Signed)
Cardiovascular and cancer risks associated with smoking discussed Smoking cessation advice given Still smoking and not motivated to quit.

## 2023-05-29 NOTE — Assessment & Plan Note (Signed)
Well-controlled hypertension with normal blood pressure readings at home Continue amlodipine 5 mg daily and valsartan HCT 160-12.5 mg daily Well-controlled diabetes with hemoglobin A1c of 6.6, off medications Cardiovascular risks associated with both hypertension and diabetes discussed Diet and nutrition discussed Blood work done today We will follow-up in 6 months

## 2023-05-29 NOTE — Progress Notes (Signed)
David Jacobs 72 y.o.   Chief Complaint  Patient presents with   Medical Management of Chronic Issues    6 month f/u for HTN and DM     HISTORY OF PRESENT ILLNESS: This is a 72 y.o. male here for 6-month follow-up of chronic medical conditions including hypertension and diabetes Accompanied by daughter who is helping with translation Still smoking. Overall feeling well.  Has no complaints or medical concerns today. BP Readings from Last 3 Encounters:  11/10/22 128/68  08/10/22 (!) 148/86  05/10/22 (!) 160/88   Lab Results  Component Value Date   HGBA1C 6.4 (A) 11/10/2022   Wt Readings from Last 3 Encounters:  05/29/23 204 lb 12.8 oz (92.9 kg)  11/10/22 204 lb (92.5 kg)  08/10/22 207 lb 8 oz (94.1 kg)     HPI   Prior to Admission medications   Medication Sig Start Date End Date Taking? Authorizing Provider  amLODipine (NORVASC) 5 MG tablet Take 1 tablet (5 mg total) by mouth daily. 08/10/22 08/05/23 Yes Elois Averitt, Eilleen Kempf, MD  rosuvastatin (CRESTOR) 20 MG tablet TAKE 1 TABLET BY MOUTH EVERY DAY 04/28/23  Yes Leydi Winstead, Eilleen Kempf, MD  valsartan-hydrochlorothiazide (DIOVAN-HCT) 160-12.5 MG tablet TAKE 1 TABLET BY MOUTH EVERY DAY 04/28/23  Yes Wilgus Deyton, Eilleen Kempf, MD  empagliflozin (JARDIANCE) 10 MG TABS tablet Take 1 tablet (10 mg total) by mouth daily before breakfast. Patient not taking: Reported on 11/10/2022 08/10/22   Georgina Quint, MD    No Known Allergies  Patient Active Problem List   Diagnosis Date Noted   Dyslipidemia associated with type 2 diabetes mellitus (HCC) 08/10/2022   Current smoker 05/10/2022   Atherosclerosis of aorta (HCC) 04/27/2020   Dyslipidemia 03/30/2018   Diet-controlled diabetes mellitus (HCC) 03/30/2018   Rectal cancer (HCC) 12/07/2015   Hypertension associated with diabetes (HCC) 11/15/2015    Past Medical History:  Diagnosis Date   Hypertension     Past Surgical History:  Procedure Laterality Date   EUS N/A 12/09/2015    Procedure: LOWER ENDOSCOPIC ULTRASOUND (EUS);  Surgeon: Willis Modena, MD;  Location: [facility];  Service: Endoscopy;  Laterality: N/A;   XI ROBOTIC ASSISTED LOWER ANTERIOR RESECTION N/A 03/25/2016   Procedure: XI ROBOTIC ASSISTED LOWER ANTERIOR RESECTION;  Surgeon: Romie Levee, MD;  Location: [facility];  Service: General;  Laterality: N/A;    Social History   Socioeconomic History   Marital status: Married    Spouse name: Not on file   Number of children: 2   Years of education: Not on file   Highest education level: Not on file  Occupational History   Not on file  Tobacco Use   Smoking status: Every Day    Current packs/day: 0.50    Average packs/day: 0.5 packs/day for 35.0 years (17.5 ttl pk-yrs)    Types: Cigarettes   Smokeless tobacco: Never  Substance and Sexual Activity   Alcohol use: No    Alcohol/week: 2.0 - 3.0 standard drinks of alcohol    Types: 2 - 3 Cans of beer per week   Drug use: No   Sexual activity: Not on file  Other Topics Concern   Not on file  Social History Narrative   Married, lives w/his wife and a son--minimal English   Works full time at [employer]   Current everyday smoker, with no plans to quit   Primary contact is daughter-in-law, David Jacobs (can interpret for him)   Social Determinants of Health   Financial Resource Strain:  Not on file  Food Insecurity: Not on file  Transportation Needs: Not on file  Physical Activity: Not on file  Stress: Not on file  Social Connections: Not on file  Intimate Partner Violence: Not on file    Family History  Problem Relation Age of Onset   Cancer Mother 40       throid cancer    Hyperlipidemia Mother    Diabetes Father      Review of Systems  Constitutional: Negative.  Negative for chills and fever.  HENT: Negative.  Negative for congestion and sore throat.   Respiratory: Negative.  Negative for cough and shortness of breath.   Cardiovascular: Negative.  Negative for chest  pain and palpitations.  Gastrointestinal:  Negative for abdominal pain, diarrhea, nausea and vomiting.  Genitourinary: Negative.  Negative for dysuria and hematuria.  Skin: Negative.  Negative for rash.  Neurological: Negative.  Negative for dizziness and headaches.  All other systems reviewed and are negative.   Today's Vitals   05/29/23 1104  BP: (!) 144/72  Pulse: 60  Temp: 98.7 F (37.1 C)  TempSrc: Oral  SpO2: 97%  Weight: 204 lb 12.8 oz (92.9 kg)  Height: 5\' 10"  (1.778 m)   Body mass index is 29.39 kg/m.   Physical Exam Vitals reviewed.  Constitutional:      Appearance: Normal appearance.  HENT:     Head: Normocephalic.  Eyes:     Extraocular Movements: Extraocular movements intact.  Cardiovascular:     Rate and Rhythm: Normal rate.  Pulmonary:     Effort: Pulmonary effort is normal.  Skin:    General: Skin is warm and dry.  Neurological:     Mental Status: He is alert and oriented to person, place, and time.  Psychiatric:        Mood and Affect: Mood normal.        Behavior: Behavior normal.    Results for orders placed or performed in visit on 05/29/23 (from the past 24 hour(s))  POCT HgB A1C     Status: Abnormal   Collection Time: 05/29/23 11:19 AM  Result Value Ref Range   Hemoglobin A1C 6.6 (A) 4.0 - 5.6 %   HbA1c POC (<> result, manual entry)     HbA1c, POC (prediabetic range)     HbA1c, POC (controlled diabetic range)       ASSESSMENT & PLAN: A total of 43 minutes was spent with the patient and counseling/coordination of care regarding preparing for this visit, review of most recent office visit notes, review of multiple chronic medical conditions under management, review of most recent blood work results including interpretation of today's hemoglobin A1c, review of all medications, education on nutrition, review of health maintenance items, smoking cessation advised, cardiovascular risks associated with hypertension and diabetes, prognosis,  documentation, and need for follow-up.  Problem List Items Addressed This Visit       Cardiovascular and Mediastinum   Hypertension associated with diabetes (HCC) - Primary    Well-controlled hypertension with normal blood pressure readings at home Continue amlodipine 5 mg daily and valsartan HCT 160-12.5 mg daily Well-controlled diabetes with hemoglobin A1c of 6.6, off medications Cardiovascular risks associated with both hypertension and diabetes discussed Diet and nutrition discussed Blood work done today We will follow-up in 6 months      Relevant Medications   amLODipine (NORVASC) 5 MG tablet   valsartan-hydrochlorothiazide (DIOVAN-HCT) 160-12.5 MG tablet   rosuvastatin (CRESTOR) 20 MG tablet   Other Relevant Orders  Urine Microalbumin w/creat. ratio   CBC with Differential/Platelet   Comprehensive metabolic panel   Lipid panel   Atherosclerosis of aorta (HCC)    Diet and nutrition discussed Continue rosuvastatin 20 mg daily.      Relevant Medications   amLODipine (NORVASC) 5 MG tablet   valsartan-hydrochlorothiazide (DIOVAN-HCT) 160-12.5 MG tablet   rosuvastatin (CRESTOR) 20 MG tablet     Endocrine   Dyslipidemia associated with type 2 diabetes mellitus (HCC)    Chronic stable conditions Well-controlled diabetes with hemoglobin A1c of 6.6 Continue rosuvastatin 20 mg daily      Relevant Medications   valsartan-hydrochlorothiazide (DIOVAN-HCT) 160-12.5 MG tablet   rosuvastatin (CRESTOR) 20 MG tablet   Other Relevant Orders   Urine Microalbumin w/creat. ratio   CBC with Differential/Platelet   Comprehensive metabolic panel   Lipid panel   POCT HgB A1C (Completed)     Other   Dyslipidemia   Relevant Medications   rosuvastatin (CRESTOR) 20 MG tablet   Current smoker    Cardiovascular and cancer risks associated with smoking discussed Smoking cessation advice given Still smoking and not motivated to quit.      Other Visit Diagnoses     Need for  vaccination       Relevant Orders   Flu Vaccine Trivalent High Dose (Fluad) (Completed)   Essential hypertension       Relevant Medications   amLODipine (NORVASC) 5 MG tablet   valsartan-hydrochlorothiazide (DIOVAN-HCT) 160-12.5 MG tablet   rosuvastatin (CRESTOR) 20 MG tablet      Patient Instructions  Hypertension, Adult High blood pressure (hypertension) is when the force of blood pumping through the arteries is too strong. The arteries are the blood vessels that carry blood from the heart throughout the body. Hypertension forces the heart to work harder to pump blood and may cause arteries to become narrow or stiff. Untreated or uncontrolled hypertension can lead to a heart attack, heart failure, a stroke, kidney disease, and other problems. A blood pressure reading consists of a higher number over a lower number. Ideally, your blood pressure should be below 120/80. The first ("top") number is called the systolic pressure. It is a measure of the pressure in your arteries as your heart beats. The second ("bottom") number is called the diastolic pressure. It is a measure of the pressure in your arteries as the heart relaxes. What are the causes? The exact cause of this condition is not known. There are some conditions that result in high blood pressure. What increases the risk? Certain factors may make you more likely to develop high blood pressure. Some of these risk factors are under your control, including: Smoking. Not getting enough exercise or physical activity. Being overweight. Having too much fat, sugar, calories, or salt (sodium) in your diet. Drinking too much alcohol. Other risk factors include: Having a personal history of heart disease, diabetes, high cholesterol, or kidney disease. Stress. Having a family history of high blood pressure and high cholesterol. Having obstructive sleep apnea. Age. The risk increases with age. What are the signs or symptoms? High blood  pressure may not cause symptoms. Very high blood pressure (hypertensive crisis) may cause: Headache. Fast or irregular heartbeats (palpitations). Shortness of breath. Nosebleed. Nausea and vomiting. Vision changes. Severe chest pain, dizziness, and seizures. How is this diagnosed? This condition is diagnosed by measuring your blood pressure while you are seated, with your arm resting on a flat surface, your legs uncrossed, and your feet flat on the floor. The  cuff of the blood pressure monitor will be placed directly against the skin of your upper arm at the level of your heart. Blood pressure should be measured at least twice using the same arm. Certain conditions can cause a difference in blood pressure between your right and left arms. If you have a high blood pressure reading during one visit or you have normal blood pressure with other risk factors, you may be asked to: Return on a different day to have your blood pressure checked again. Monitor your blood pressure at home for 1 week or longer. If you are diagnosed with hypertension, you may have other blood or imaging tests to help your health care provider understand your overall risk for other conditions. How is this treated? This condition is treated by making healthy lifestyle changes, such as eating healthy foods, exercising more, and reducing your alcohol intake. You may be referred for counseling on a healthy diet and physical activity. Your health care provider may prescribe medicine if lifestyle changes are not enough to get your blood pressure under control and if: Your systolic blood pressure is above 130. Your diastolic blood pressure is above 80. Your personal target blood pressure may vary depending on your medical conditions, your age, and other factors. Follow these instructions at home: Eating and drinking  Eat a diet that is high in fiber and potassium, and low in sodium, added sugar, and fat. An example of this eating  plan is called the DASH diet. DASH stands for Dietary Approaches to Stop Hypertension. To eat this way: Eat plenty of fresh fruits and vegetables. Try to fill one half of your plate at each meal with fruits and vegetables. Eat whole grains, such as whole-wheat pasta, brown rice, or whole-grain bread. Fill about one fourth of your plate with whole grains. Eat or drink low-fat dairy products, such as skim milk or low-fat yogurt. Avoid fatty cuts of meat, processed or cured meats, and poultry with skin. Fill about one fourth of your plate with lean proteins, such as fish, chicken without skin, beans, eggs, or tofu. Avoid pre-made and processed foods. These tend to be higher in sodium, added sugar, and fat. Reduce your daily sodium intake. Many people with hypertension should eat less than 1,500 mg of sodium a day. Do not drink alcohol if: Your health care provider tells you not to drink. You are pregnant, may be pregnant, or are planning to become pregnant. If you drink alcohol: Limit how much you have to: 0-1 drink a day for women. 0-2 drinks a day for men. Know how much alcohol is in your drink. In the [place] one drink equals one 12 oz bottle of beer (355 mL), one 5 oz glass of wine (148 mL), or one 1 oz glass of hard liquor (44 mL). Lifestyle  Work with your health care provider to maintain a healthy body weight or to lose weight. Ask what an ideal weight is for you. Get at least 30 minutes of exercise that causes your heart to beat faster (aerobic exercise) most days of the week. Activities may include walking, swimming, or biking. Include exercise to strengthen your muscles (resistance exercise), such as Pilates or lifting weights, as part of your weekly exercise routine. Try to do these types of exercises for 30 minutes at least 3 days a week. Do not use any products that contain nicotine or tobacco. These products include cigarettes, chewing tobacco, and vaping devices, such as e-cigarettes.  If you need help quitting,  ask your health care provider. Monitor your blood pressure at home as told by your health care provider. Keep all follow-up visits. This is important. Medicines Take over-the-counter and prescription medicines only as told by your health care provider. Follow directions carefully. Blood pressure medicines must be taken as prescribed. Do not skip doses of blood pressure medicine. Doing this puts you at risk for problems and can make the medicine less effective. Ask your health care provider about side effects or reactions to medicines that you should watch for. Contact a health care provider if you: Think you are having a reaction to a medicine you are taking. Have headaches that keep coming back (recurring). Feel dizzy. Have swelling in your ankles. Have trouble with your vision. Get help right away if you: Develop a severe headache or confusion. Have unusual weakness or numbness. Feel faint. Have severe pain in your chest or abdomen. Vomit repeatedly. Have trouble breathing. These symptoms may be an emergency. Get help right away. Call 911. Do not wait to see if the symptoms will go away. Do not drive yourself to the hospital. Summary Hypertension is when the force of blood pumping through your arteries is too strong. If this condition is not controlled, it may put you at risk for serious complications. Your personal target blood pressure may vary depending on your medical conditions, your age, and other factors. For most people, a normal blood pressure is less than 120/80. Hypertension is treated with lifestyle changes, medicines, or a combination of both. Lifestyle changes include losing weight, eating a healthy, low-sodium diet, exercising more, and limiting alcohol. This information is not intended to replace advice given to you by your health care provider. Make sure you discuss any questions you have with your health care provider. Document Revised:  05/18/2021 Document Reviewed: 05/18/2021 Elsevier Patient Education  2024 Elsevier Inc.      Edwina Barth, MD [facility]

## 2023-05-29 NOTE — Assessment & Plan Note (Signed)
Chronic stable conditions Well-controlled diabetes with hemoglobin A1c of 6.6 Continue rosuvastatin 20 mg daily

## 2023-05-29 NOTE — Patient Instructions (Signed)
Hypertension, Adult High blood pressure (hypertension) is when the force of blood pumping through the arteries is too strong. The arteries are the blood vessels that carry blood from the heart throughout the body. Hypertension forces the heart to work harder to pump blood and may cause arteries to become narrow or stiff. Untreated or uncontrolled hypertension can lead to a heart attack, heart failure, a stroke, kidney disease, and other problems. A blood pressure reading consists of a higher number over a lower number. Ideally, your blood pressure should be below 120/80. The first ("top") number is called the systolic pressure. It is a measure of the pressure in your arteries as your heart beats. The second ("bottom") number is called the diastolic pressure. It is a measure of the pressure in your arteries as the heart relaxes. What are the causes? The exact cause of this condition is not known. There are some conditions that result in high blood pressure. What increases the risk? Certain factors may make you more likely to develop high blood pressure. Some of these risk factors are under your control, including: Smoking. Not getting enough exercise or physical activity. Being overweight. Having too much fat, sugar, calories, or salt (sodium) in your diet. Drinking too much alcohol. Other risk factors include: Having a personal history of heart disease, diabetes, high cholesterol, or kidney disease. Stress. Having a family history of high blood pressure and high cholesterol. Having obstructive sleep apnea. Age. The risk increases with age. What are the signs or symptoms? High blood pressure may not cause symptoms. Very high blood pressure (hypertensive crisis) may cause: Headache. Fast or irregular heartbeats (palpitations). Shortness of breath. Nosebleed. Nausea and vomiting. Vision changes. Severe chest pain, dizziness, and seizures. How is this diagnosed? This condition is diagnosed by  measuring your blood pressure while you are seated, with your arm resting on a flat surface, your legs uncrossed, and your feet flat on the floor. The cuff of the blood pressure monitor will be placed directly against the skin of your upper arm at the level of your heart. Blood pressure should be measured at least twice using the same arm. Certain conditions can cause a difference in blood pressure between your right and left arms. If you have a high blood pressure reading during one visit or you have normal blood pressure with other risk factors, you may be asked to: Return on a different day to have your blood pressure checked again. Monitor your blood pressure at home for 1 week or longer. If you are diagnosed with hypertension, you may have other blood or imaging tests to help your health care provider understand your overall risk for other conditions. How is this treated? This condition is treated by making healthy lifestyle changes, such as eating healthy foods, exercising more, and reducing your alcohol intake. You may be referred for counseling on a healthy diet and physical activity. Your health care provider may prescribe medicine if lifestyle changes are not enough to get your blood pressure under control and if: Your systolic blood pressure is above 130. Your diastolic blood pressure is above 80. Your personal target blood pressure may vary depending on your medical conditions, your age, and other factors. Follow these instructions at home: Eating and drinking  Eat a diet that is high in fiber and potassium, and low in sodium, added sugar, and fat. An example of this eating plan is called the DASH diet. DASH stands for Dietary Approaches to Stop Hypertension. To eat this way: Eat   plenty of fresh fruits and vegetables. Try to fill one half of your plate at each meal with fruits and vegetables. Eat whole grains, such as whole-wheat pasta, brown rice, or whole-grain bread. Fill about one  fourth of your plate with whole grains. Eat or drink low-fat dairy products, such as skim milk or low-fat yogurt. Avoid fatty cuts of meat, processed or cured meats, and poultry with skin. Fill about one fourth of your plate with lean proteins, such as fish, chicken without skin, beans, eggs, or tofu. Avoid pre-made and processed foods. These tend to be higher in sodium, added sugar, and fat. Reduce your daily sodium intake. Many people with hypertension should eat less than 1,500 mg of sodium a day. Do not drink alcohol if: Your health care provider tells you not to drink. You are pregnant, may be pregnant, or are planning to become pregnant. If you drink alcohol: Limit how much you have to: 0-1 drink a day for women. 0-2 drinks a day for men. Know how much alcohol is in your drink. In the [place] one drink equals one 12 oz bottle of beer (355 mL), one 5 oz glass of wine (148 mL), or one 1 oz glass of hard liquor (44 mL). Lifestyle  Work with your health care provider to maintain a healthy body weight or to lose weight. Ask what an ideal weight is for you. Get at least 30 minutes of exercise that causes your heart to beat faster (aerobic exercise) most days of the week. Activities may include walking, swimming, or biking. Include exercise to strengthen your muscles (resistance exercise), such as Pilates or lifting weights, as part of your weekly exercise routine. Try to do these types of exercises for 30 minutes at least 3 days a week. Do not use any products that contain nicotine or tobacco. These products include cigarettes, chewing tobacco, and vaping devices, such as e-cigarettes. If you need help quitting, ask your health care provider. Monitor your blood pressure at home as told by your health care provider. Keep all follow-up visits. This is important. Medicines Take over-the-counter and prescription medicines only as told by your health care provider. Follow directions carefully. Blood  pressure medicines must be taken as prescribed. Do not skip doses of blood pressure medicine. Doing this puts you at risk for problems and can make the medicine less effective. Ask your health care provider about side effects or reactions to medicines that you should watch for. Contact a health care provider if you: Think you are having a reaction to a medicine you are taking. Have headaches that keep coming back (recurring). Feel dizzy. Have swelling in your ankles. Have trouble with your vision. Get help right away if you: Develop a severe headache or confusion. Have unusual weakness or numbness. Feel faint. Have severe pain in your chest or abdomen. Vomit repeatedly. Have trouble breathing. These symptoms may be an emergency. Get help right away. Call 911. Do not wait to see if the symptoms will go away. Do not drive yourself to the hospital. Summary Hypertension is when the force of blood pumping through your arteries is too strong. If this condition is not controlled, it may put you at risk for serious complications. Your personal target blood pressure may vary depending on your medical conditions, your age, and other factors. For most people, a normal blood pressure is less than 120/80. Hypertension is treated with lifestyle changes, medicines, or a combination of both. Lifestyle changes include losing weight, eating a healthy,   low-sodium diet, exercising more, and limiting alcohol. This information is not intended to replace advice given to you by your health care provider. Make sure you discuss any questions you have with your health care provider. Document Revised: 05/18/2021 Document Reviewed: 05/18/2021 Elsevier Patient Education  2024 Elsevier Inc.  

## 2023-05-29 NOTE — Assessment & Plan Note (Signed)
Diet and nutrition discussed.  Continue rosuvastatin 20 mg daily.

## 2023-11-27 ENCOUNTER — Encounter: Payer: Self-pay | Admitting: Emergency Medicine

## 2023-11-27 ENCOUNTER — Ambulatory Visit (INDEPENDENT_AMBULATORY_CARE_PROVIDER_SITE_OTHER): Payer: Medicare HMO | Admitting: Emergency Medicine

## 2023-11-27 VITALS — BP 152/74 | HR 66 | Temp 97.7°F | Ht 70.0 in | Wt 203.0 lb

## 2023-11-27 DIAGNOSIS — E1169 Type 2 diabetes mellitus with other specified complication: Secondary | ICD-10-CM

## 2023-11-27 DIAGNOSIS — E785 Hyperlipidemia, unspecified: Secondary | ICD-10-CM

## 2023-11-27 DIAGNOSIS — C20 Malignant neoplasm of rectum: Secondary | ICD-10-CM | POA: Diagnosis not present

## 2023-11-27 DIAGNOSIS — I700 Atherosclerosis of aorta: Secondary | ICD-10-CM

## 2023-11-27 DIAGNOSIS — F17200 Nicotine dependence, unspecified, uncomplicated: Secondary | ICD-10-CM | POA: Diagnosis not present

## 2023-11-27 DIAGNOSIS — E1159 Type 2 diabetes mellitus with other circulatory complications: Secondary | ICD-10-CM | POA: Diagnosis not present

## 2023-11-27 DIAGNOSIS — I152 Hypertension secondary to endocrine disorders: Secondary | ICD-10-CM | POA: Diagnosis not present

## 2023-11-27 LAB — POCT GLYCOSYLATED HEMOGLOBIN (HGB A1C): Hemoglobin A1C: 6.6 % — AB (ref 4.0–5.6)

## 2023-11-27 NOTE — Assessment & Plan Note (Signed)
History of rectal cancer.  No concerns.

## 2023-11-27 NOTE — Assessment & Plan Note (Signed)
 Diet and nutrition discussed.  Continue rosuvastatin 20 mg daily.

## 2023-11-27 NOTE — Assessment & Plan Note (Signed)
 Cardiovascular and cancer risks associated with smoking discussed Smoking cessation advice given Still smoking and not motivated to quit.

## 2023-11-27 NOTE — Patient Instructions (Signed)
 Health Maintenance After Age 74 After age 4, you are at a higher risk for certain long-term diseases and infections as well as injuries from falls. Falls are a major cause of broken bones and head injuries in people who are older than age 47. Getting regular preventive care can help to keep you healthy and well. Preventive care includes getting regular testing and making lifestyle changes as recommended by your health care provider. Talk with your health care provider about: Which screenings and tests you should have. A screening is a test that checks for a disease when you have no symptoms. A diet and exercise plan that is right for you. What should I know about screenings and tests to prevent falls? Screening and testing are the best ways to find a health problem early. Early diagnosis and treatment give you the best chance of managing medical conditions that are common after age 37. Certain conditions and lifestyle choices may make you more likely to have a fall. Your health care provider may recommend: Regular vision checks. Poor vision and conditions such as cataracts can make you more likely to have a fall. If you wear glasses, make sure to get your prescription updated if your vision changes. Medicine review. Work with your health care provider to regularly review all of the medicines you are taking, including over-the-counter medicines. Ask your health care provider about any side effects that may make you more likely to have a fall. Tell your health care provider if any medicines that you take make you feel dizzy or sleepy. Strength and balance checks. Your health care provider may recommend certain tests to check your strength and balance while standing, walking, or changing positions. Foot health exam. Foot pain and numbness, as well as not wearing proper footwear, can make you more likely to have a fall. Screenings, including: Osteoporosis screening. Osteoporosis is a condition that causes  the bones to get weaker and break more easily. Blood pressure screening. Blood pressure changes and medicines to control blood pressure can make you feel dizzy. Depression screening. You may be more likely to have a fall if you have a fear of falling, feel depressed, or feel unable to do activities that you used to do. Alcohol use screening. Using too much alcohol can affect your balance and may make you more likely to have a fall. Follow these instructions at home: Lifestyle Do not drink alcohol if: Your health care provider tells you not to drink. If you drink alcohol: Limit how much you have to: 0-1 drink a day for women. 0-2 drinks a day for men. Know how much alcohol is in your drink. In the [place] one drink equals one 12 oz bottle of beer (355 mL), one 5 oz glass of wine (148 mL), or one 1 oz glass of hard liquor (44 mL). Do not use any products that contain nicotine or tobacco. These products include cigarettes, chewing tobacco, and vaping devices, such as e-cigarettes. If you need help quitting, ask your health care provider. Activity  Follow a regular exercise program to stay fit. This will help you maintain your balance. Ask your health care provider what types of exercise are appropriate for you. If you need a cane or walker, use it as recommended by your health care provider. Wear supportive shoes that have nonskid soles. Safety  Remove any tripping hazards, such as rugs, cords, and clutter. Install safety equipment such as grab bars in bathrooms and safety rails on stairs. Keep rooms and walkways  well-lit. General instructions Talk with your health care provider about your risks for falling. Tell your health care provider if: You fall. Be sure to tell your health care provider about all falls, even ones that seem minor. You feel dizzy, tiredness (fatigue), or off-balance. Take over-the-counter and prescription medicines only as told by your health care provider. These include  supplements. Eat a healthy diet and maintain a healthy weight. A healthy diet includes low-fat dairy products, low-fat (lean) meats, and fiber from whole grains, beans, and lots of fruits and vegetables. Stay current with your vaccines. Schedule regular health, dental, and eye exams. Summary Having a healthy lifestyle and getting preventive care can help to protect your health and wellness after age 11. Screening and testing are the best way to find a health problem early and help you avoid having a fall. Early diagnosis and treatment give you the best chance for managing medical conditions that are more common for people who are older than age 28. Falls are a major cause of broken bones and head injuries in people who are older than age 48. Take precautions to prevent a fall at home. Work with your health care provider to learn what changes you can make to improve your health and wellness and to prevent falls. This information is not intended to replace advice given to you by your health care provider. Make sure you discuss any questions you have with your health care provider. Document Revised: 11/30/2020 Document Reviewed: 11/30/2020 Elsevier Patient Education  2024 [employer].

## 2023-11-27 NOTE — Progress Notes (Signed)
 David Jacobs 73 y.o.   Chief Complaint  Patient presents with   Follow-up    6 month f/u for HTN / DM. No other concerns     HISTORY OF PRESENT ILLNESS: This is a 73 y.o. male here for 37-month follow-up of diabetes and hypertension Overall doing well.  Has no complaints or medical concerns today. Wt Readings from Last 3 Encounters:  11/27/23 203 lb (92.1 kg)  05/29/23 204 lb 12.8 oz (92.9 kg)  11/10/22 204 lb (92.5 kg)     HPI   Prior to Admission medications   Medication Sig Start Date End Date Taking? Authorizing Provider  amLODipine  (NORVASC ) 5 MG tablet Take 1 tablet (5 mg total) by mouth daily. 05/29/23 05/23/24 Yes Opie Maclaughlin, Isidro Margo, MD  rosuvastatin  (CRESTOR ) 20 MG tablet Take 1 tablet (20 mg total) by mouth daily. 05/29/23  Yes Aine Strycharz, Isidro Margo, MD  valsartan -hydrochlorothiazide  (DIOVAN -HCT) 160-12.5 MG tablet Take 1 tablet by mouth daily. 05/29/23  Yes Cheryln Balcom, Isidro Margo, MD  empagliflozin  (JARDIANCE ) 10 MG TABS tablet Take 1 tablet (10 mg total) by mouth daily before breakfast. Patient not taking: Reported on 11/10/2022 08/10/22   Teyanna Thielman Jose, MD    No Known Allergies  Patient Active Problem List   Diagnosis Date Noted   Dyslipidemia associated with type 2 diabetes mellitus (HCC) 08/10/2022   Current smoker 05/10/2022   Atherosclerosis of aorta (HCC) 04/27/2020   Dyslipidemia 03/30/2018   Diet-controlled diabetes mellitus (HCC) 03/30/2018   Rectal cancer (HCC) 12/07/2015   Hypertension associated with diabetes (HCC) 11/15/2015    Past Medical History:  Diagnosis Date   Hypertension     Past Surgical History:  Procedure Laterality Date   EUS N/A 12/09/2015   Procedure: LOWER ENDOSCOPIC ULTRASOUND (EUS);  Surgeon: Evangeline Hilts, MD;  Location: [facility];  Service: Endoscopy;  Laterality: N/A;   XI ROBOTIC ASSISTED LOWER ANTERIOR RESECTION N/A 03/25/2016   Procedure: XI ROBOTIC ASSISTED LOWER ANTERIOR RESECTION;  Surgeon: Joyce Nixon,  MD;  Location: [facility];  Service: General;  Laterality: N/A;    Social History   Socioeconomic History   Marital status: Married    Spouse name: Not on file   Number of children: 2   Years of education: Not on file   Highest education level: Not on file  Occupational History   Not on file  Tobacco Use   Smoking status: Every Day    Current packs/day: 0.50    Average packs/day: 0.5 packs/day for 35.0 years (17.5 ttl pk-yrs)    Types: Cigarettes   Smokeless tobacco: Never  Substance and Sexual Activity   Alcohol use: No    Alcohol/week: 2.0 - 3.0 standard drinks of alcohol    Types: 2 - 3 Cans of beer per week   Drug use: No   Sexual activity: Not on file  Other Topics Concern   Not on file  Social History Narrative   Married, lives w/his wife and a son--minimal English   Works full time at [employer]   Current everyday smoker, with no plans to quit   Primary contact is daughter-in-law, Star Corless (can interpret for him)   Social Drivers of Corporate investment banker Strain: Not on file  Food Insecurity: Not on file  Transportation Needs: Not on file  Physical Activity: Not on file  Stress: Not on file  Social Connections: Not on file  Intimate Partner Violence: Not on file    Family History  Problem Relation Age of  Onset   Cancer Mother 25       throid cancer    Hyperlipidemia Mother    Diabetes Father      Review of Systems  Constitutional: Negative.  Negative for chills and fever.  HENT: Negative.  Negative for congestion and sore throat.   Respiratory: Negative.  Negative for cough and shortness of breath.   Cardiovascular: Negative.  Negative for chest pain and palpitations.  Gastrointestinal:  Negative for abdominal pain, diarrhea, nausea and vomiting.  Genitourinary: Negative.  Negative for dysuria and hematuria.  Skin: Negative.  Negative for rash.  Neurological: Negative.  Negative for dizziness and headaches.  All other systems  reviewed and are negative.   Today's Vitals   11/27/23 1100  BP: (!) 152/74  Pulse: 66  Temp: 97.7 F (36.5 C)  TempSrc: Oral  SpO2: 98%  Weight: 203 lb (92.1 kg)  Height: 5\' 10"  (1.778 m)   Body mass index is 29.13 kg/m.   Physical Exam Vitals reviewed.  Constitutional:      Appearance: Normal appearance.  HENT:     Head: Normocephalic.     Mouth/Throat:     Mouth: Mucous membranes are moist.     Pharynx: Oropharynx is clear.  Eyes:     Extraocular Movements: Extraocular movements intact.     Pupils: Pupils are equal, round, and reactive to light.  Cardiovascular:     Rate and Rhythm: Normal rate and regular rhythm.     Pulses: Normal pulses.     Heart sounds: Normal heart sounds.  Pulmonary:     Effort: Pulmonary effort is normal.     Breath sounds: Normal breath sounds.  Abdominal:     Palpations: Abdomen is soft.     Tenderness: There is no abdominal tenderness.  Musculoskeletal:     Right lower leg: No edema.     Left lower leg: No edema.  Skin:    General: Skin is warm and dry.  Neurological:     Mental Status: He is alert and oriented to person, place, and time.  Psychiatric:        Mood and Affect: Mood normal.        Behavior: Behavior normal.    Results for orders placed or performed in visit on 11/27/23 (from the past 24 hours)  POCT HgB A1C     Status: Abnormal   Collection Time: 11/27/23  1:14 PM  Result Value Ref Range   Hemoglobin A1C 6.6 (A) 4.0 - 5.6 %   HbA1c POC (<> result, manual entry)     HbA1c, POC (prediabetic range)     HbA1c, POC (controlled diabetic range)       ASSESSMENT & PLAN: A total of 43 minutes was spent with the patient and counseling/coordination of care regarding preparing for this visit, review of most recent office visit notes, review of multiple chronic medical conditions and their management, review of all medications, review of most recent bloodwork results, review of health maintenance items, education on  nutrition, prognosis, documentation, and need for follow up.   Problem List Items Addressed This Visit       Cardiovascular and Mediastinum   Hypertension associated with diabetes (HCC) - Primary   Well-controlled hypertension with normal blood pressure readings at home Continue amlodipine  5 mg daily and valsartan  HCT 160-12.5 mg daily Well-controlled diabetes with hemoglobin A1c of 6.6, off medications Cardiovascular risks associated with both hypertension and diabetes discussed Diet and nutrition discussed We will follow-up in 6  months      Relevant Orders   POCT HgB A1C   Atherosclerosis of aorta (HCC)   Diet and nutrition discussed Continue rosuvastatin  20 mg daily.        Digestive   Rectal cancer [facility])   History of rectal cancer. No concerns         Endocrine   Dyslipidemia associated with type 2 diabetes mellitus (HCC)   Chronic stable conditions Well-controlled diabetes with hemoglobin A1c of 6.6 Continue rosuvastatin  20 mg daily        Other   Current smoker   Cardiovascular and cancer risks associated with smoking discussed Smoking cessation advice given Still smoking and not motivated to quit.      Patient Instructions  Health Maintenance After Age 69 After age 62, you are at a higher risk for certain long-term diseases and infections as well as injuries from falls. Falls are a major cause of broken bones and head injuries in people who are older than age 53. Getting regular preventive care can help to keep you healthy and well. Preventive care includes getting regular testing and making lifestyle changes as recommended by your health care provider. Talk with your health care provider about: Which screenings and tests you should have. A screening is a test that checks for a disease when you have no symptoms. A diet and exercise plan that is right for you. What should I know about screenings and tests to prevent falls? Screening and testing are the best  ways to find a health problem early. Early diagnosis and treatment give you the best chance of managing medical conditions that are common after age 45. Certain conditions and lifestyle choices may make you more likely to have a fall. Your health care provider may recommend: Regular vision checks. Poor vision and conditions such as cataracts can make you more likely to have a fall. If you wear glasses, make sure to get your prescription updated if your vision changes. Medicine review. Work with your health care provider to regularly review all of the medicines you are taking, including over-the-counter medicines. Ask your health care provider about any side effects that may make you more likely to have a fall. Tell your health care provider if any medicines that you take make you feel dizzy or sleepy. Strength and balance checks. Your health care provider may recommend certain tests to check your strength and balance while standing, walking, or changing positions. Foot health exam. Foot pain and numbness, as well as not wearing proper footwear, can make you more likely to have a fall. Screenings, including: Osteoporosis screening. Osteoporosis is a condition that causes the bones to get weaker and break more easily. Blood pressure screening. Blood pressure changes and medicines to control blood pressure can make you feel dizzy. Depression screening. You may be more likely to have a fall if you have a fear of falling, feel depressed, or feel unable to do activities that you used to do. Alcohol use screening. Using too much alcohol can affect your balance and may make you more likely to have a fall. Follow these instructions at home: Lifestyle Do not drink alcohol if: Your health care provider tells you not to drink. If you drink alcohol: Limit how much you have to: 0-1 drink a day for women. 0-2 drinks a day for men. Know how much alcohol is in your drink. In the [place] one drink equals one 12 oz  bottle of beer (355 mL), one 5 oz glass  of wine (148 mL), or one 1 oz glass of hard liquor (44 mL). Do not use any products that contain nicotine or tobacco. These products include cigarettes, chewing tobacco, and vaping devices, such as e-cigarettes. If you need help quitting, ask your health care provider. Activity  Follow a regular exercise program to stay fit. This will help you maintain your balance. Ask your health care provider what types of exercise are appropriate for you. If you need a cane or walker, use it as recommended by your health care provider. Wear supportive shoes that have nonskid soles. Safety  Remove any tripping hazards, such as rugs, cords, and clutter. Install safety equipment such as grab bars in bathrooms and safety rails on stairs. Keep rooms and walkways well-lit. General instructions Talk with your health care provider about your risks for falling. Tell your health care provider if: You fall. Be sure to tell your health care provider about all falls, even ones that seem minor. You feel dizzy, tiredness (fatigue), or off-balance. Take over-the-counter and prescription medicines only as told by your health care provider. These include supplements. Eat a healthy diet and maintain a healthy weight. A healthy diet includes low-fat dairy products, low-fat (lean) meats, and fiber from whole grains, beans, and lots of fruits and vegetables. Stay current with your vaccines. Schedule regular health, dental, and eye exams. Summary Having a healthy lifestyle and getting preventive care can help to protect your health and wellness after age 54. Screening and testing are the best way to find a health problem early and help you avoid having a fall. Early diagnosis and treatment give you the best chance for managing medical conditions that are more common for people who are older than age 28. Falls are a major cause of broken bones and head injuries in people who are older than  age 76. Take precautions to prevent a fall at home. Work with your health care provider to learn what changes you can make to improve your health and wellness and to prevent falls. This information is not intended to replace advice given to you by your health care provider. Make sure you discuss any questions you have with your health care provider. Document Revised: 11/30/2020 Document Reviewed: 11/30/2020 Elsevier Patient Education  2024 [employer]    Maryagnes Small, MD [facility]

## 2023-11-27 NOTE — Assessment & Plan Note (Signed)
 Well-controlled hypertension with normal blood pressure readings at home Continue amlodipine  5 mg daily and valsartan  HCT 160-12.5 mg daily Well-controlled diabetes with hemoglobin A1c of 6.6, off medications Cardiovascular risks associated with both hypertension and diabetes discussed Diet and nutrition discussed We will follow-up in 6 months

## 2023-11-27 NOTE — Assessment & Plan Note (Signed)
 Chronic stable conditions Well-controlled diabetes with hemoglobin A1c of 6.6 Continue rosuvastatin 20 mg daily

## 2024-02-09 DIAGNOSIS — H524 Presbyopia: Secondary | ICD-10-CM | POA: Diagnosis not present

## 2024-02-09 DIAGNOSIS — H52223 Regular astigmatism, bilateral: Secondary | ICD-10-CM | POA: Diagnosis not present

## 2024-02-09 DIAGNOSIS — H5203 Hypermetropia, bilateral: Secondary | ICD-10-CM | POA: Diagnosis not present

## 2024-02-09 DIAGNOSIS — H40051 Ocular hypertension, right eye: Secondary | ICD-10-CM | POA: Diagnosis not present

## 2024-02-29 ENCOUNTER — Encounter: Payer: Self-pay | Admitting: Registered Nurse

## 2024-02-29 ENCOUNTER — Ambulatory Visit: Admitting: Registered Nurse

## 2024-02-29 VITALS — BP 122/60 | HR 56 | Temp 98.2°F

## 2024-02-29 DIAGNOSIS — M25522 Pain in left elbow: Secondary | ICD-10-CM

## 2024-02-29 MED ORDER — IBUPROFEN 800 MG PO TABS: 800.0000 mg | ORAL_TABLET | Freq: Three times a day (TID) | ORAL | 0 refills | Status: AC | PRN

## 2024-02-29 NOTE — Progress Notes (Signed)
 Established Patient Office Visit  Subjective   Patient ID: David Jacobs, male    DOB: 05-05-1951  Age: 73 y.o. MRN: [id number]  Chief Complaint  Patient presents with   Pain    Right elbow     72y/o caucasian male established patient here for evaluation right elbow pain started at work denied known injury/trauma/rash/bruising.  Has not started any new hobbies/yard projects/exercise programs.  Works as silver polisher [employer]  Requested Jovan interpret for him  Supervisor was notified that he was having pain and going to clinic  7/10 pain right elbow right hand dominant      Review of Systems  Constitutional:  Negative for chills and fever.  Respiratory:  Negative for cough.   Cardiovascular:  Negative for chest pain.  Gastrointestinal:  Negative for nausea and vomiting.  Musculoskeletal:  Positive for joint pain and myalgias. Negative for back pain and neck pain.  Skin:  Negative for itching and rash.  Neurological:  Negative for dizziness, tingling, tremors, sensory change, speech change, focal weakness, seizures, loss of consciousness, weakness and headaches.  Psychiatric/Behavioral:  The patient does not have insomnia.       Objective:     BP 122/60 (BP Location: Left Arm, Patient Position: Sitting, Cuff Size: Normal)   Pulse (!) 56   Temp 98.2 F (36.8 C)   SpO2 98%    Physical Exam Vitals and nursing note reviewed.  Constitutional:      General: He is awake. He is not in acute distress.    Appearance: Normal appearance. He is well-developed, well-groomed and normal weight. He is not ill-appearing, toxic-appearing or diaphoretic.  HENT:     Head: Normocephalic and atraumatic.     Jaw: There is normal jaw occlusion.     Salivary Glands: Right salivary gland is not diffusely enlarged. Left salivary gland is not diffusely enlarged.     Right Ear: Hearing and external ear normal.     Left Ear: Hearing and external ear normal.     Nose: Nose normal. No  congestion or rhinorrhea.     Mouth/Throat:     Lips: Pink. No lesions.     Mouth: Mucous membranes are moist.     Pharynx: Oropharynx is clear.  Eyes:     General: Lids are normal. Vision grossly intact. Gaze aligned appropriately. No scleral icterus.       Right eye: No discharge.        Left eye: No discharge.     Extraocular Movements: Extraocular movements intact.     Conjunctiva/sclera: Conjunctivae normal.     Pupils: Pupils are equal, round, and reactive to light.  Neck:     Trachea: Trachea normal.  Cardiovascular:     Rate and Rhythm: Normal rate and regular rhythm.     Pulses: Normal pulses.          Radial pulses are 2+ on the right side and 2+ on the left side.  Pulmonary:     Effort: Pulmonary effort is normal. No respiratory distress.     Breath sounds: Normal breath sounds and air entry. No stridor or transmitted upper airway sounds. No wheezing.     Comments: Spoke full sentences without difficulty; no cough observed in exam room Abdominal:     General: Abdomen is flat.  Musculoskeletal:        General: Tenderness present. No swelling, deformity or signs of injury. Normal range of motion.     Right shoulder: No swelling, deformity,  effusion, laceration, tenderness, bony tenderness or crepitus. Normal range of motion. Normal strength.     Left shoulder: No swelling, deformity, effusion, laceration, tenderness, bony tenderness or crepitus. Normal range of motion. Normal strength.     Right upper arm: No swelling, edema, deformity, lacerations, tenderness or bony tenderness.     Left upper arm: No swelling, edema, deformity, lacerations, tenderness or bony tenderness.     Right elbow: No swelling, deformity, effusion or lacerations. Normal range of motion. Tenderness present in medial epicondyle and lateral epicondyle. No radial head or olecranon process tenderness.     Left elbow: No swelling, deformity, effusion or lacerations. Normal range of motion. No tenderness. No  lateral epicondyle or olecranon process tenderness.     Right forearm: Tenderness present. No swelling, edema, deformity, lacerations or bony tenderness.     Left forearm: No swelling, edema, deformity, lacerations, tenderness or bony tenderness.     Right wrist: No swelling, deformity, effusion, lacerations, tenderness, bony tenderness or crepitus. Normal range of motion.     Left wrist: No swelling, deformity, effusion, lacerations, tenderness, bony tenderness or crepitus. Normal range of motion.     Right hand: No swelling, deformity or lacerations. Normal range of motion. Normal strength. Normal capillary refill.     Left hand: No swelling, deformity or lacerations. Normal range of motion. Normal strength. Normal capillary refill.       Arms:     Cervical back: Normal range of motion and neck supple. No swelling, edema, deformity, erythema, signs of trauma, lacerations, rigidity, torticollis or crepitus. Normal range of motion.     Thoracic back: No swelling, edema, deformity, signs of trauma or lacerations. Normal range of motion.     Right lower leg: No edema.     Left lower leg: No edema.     Comments: Medial greater than lateral epicondyle soft tissue TTP no palpable defect; full arom shoulder/elbow/wrist/fingers bilaterally equal; bilateral hand grasp equal 5/5  Lymphadenopathy:     Head:     Right side of head: No submandibular or preauricular adenopathy.     Left side of head: No submandibular or preauricular adenopathy.     Cervical: No cervical adenopathy.     Right cervical: No superficial cervical adenopathy.    Left cervical: No superficial cervical adenopathy.  Skin:    General: Skin is warm and dry.     Capillary Refill: Capillary refill takes less than 2 seconds.     Coloration: Skin is not ashen, cyanotic, jaundiced, mottled, pale or sallow.     Findings: No abrasion, abscess, acne, bruising, burn, ecchymosis, erythema, signs of injury, laceration, lesion, petechiae, rash  or wound.     Nails: There is no clubbing.     Comments: Callous bilateral hands and silver polish staining  Neurological:     General: No focal deficit present.     Mental Status: He is alert and oriented to person, place, and time. Mental status is at baseline.     GCS: GCS eye subscore is 4. GCS verbal subscore is 5. GCS motor subscore is 6.     Cranial Nerves: No cranial nerve deficit, dysarthria or facial asymmetry.     Sensory: Sensation is intact.     Motor: Motor function is intact. No weakness, tremor, atrophy, abnormal muscle tone or seizure activity.     Coordination: Coordination is intact. Coordination normal.     Gait: Gait is intact. Gait normal.     Comments: In/out of chair without  difficulty; gait sure and steady in clinic; bilateral hand grasp equal 5/5  Psychiatric:        Attention and Perception: Attention and perception normal.        Mood and Affect: Mood and affect normal.        Speech: Speech normal.        Behavior: Behavior normal. Behavior is cooperative.        Thought Content: Thought content normal.        Cognition and Memory: Cognition and memory normal.        Judgment: Judgment normal.    1300 patient reported elbow feeling better in [facility]center with strap use  Discussed hand wash and air dry only do not put in washing machine/dryer  Remove to sleep Supervisor/HR Tonya and safety officer Alm Mems notified patient was seen in clinic today and treated with conservative care no know injury most likely related to repetitive motions of silver polishing.  No results found for any visits on 02/29/24.    The ASCVD Risk score (Arnett DK, et al., 2019) failed to calculate for the following reasons:   The valid total cholesterol range is 130 to 320 mg/dL    Assessment & Plan:   Problem List Items Addressed This Visit   None Visit Diagnoses       Left elbow pain    -  Primary      Patient given [employer] handouts on (golf and tennis-refused  printed version) elbow tendonitis lateral with rehab exercises.  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 Discussed rest, nsaid x 2 weeks, stretching, lateral epicondylitis strap fitted and given 1 from clinic stock today.  He has ice pack  at home and icing 15 minutesTID.  Refused [facility] appt today stated able to perform his duties at this time.  Discussed with patient takes longer to resolve when  ongoing for months consider orthopedics and steroid injection if no relief with NSAID,  rest, ice, strap. Discussed motrin /ibuprofen  can counteract blood pressure  medication if headache, hand/ankle/feet swelling, visual changes to stop motrin /ibuprofen .  Hydrate, hydrate, hydrate, avoid dehydration when taking NSAID. Follow up with NP if  no improvement in symptoms with plan of care. Will consider  [facility]/orthopedics and formal PT  if fails conservative treatment.  Unable to write work restrictions in this clinic if unable to perform duties will need appt with [facility]  Patient verbalized understanding of instructions, agreed  with plan of care and had no further questions at this time.  Return in about 2 weeks (around 03/14/2024), or if symptoms worsen or fail to improve, for re-evaluation sooner if worsening.    Ellouise DELENA Hope, NP

## 2024-02-29 NOTE — Patient Instructions (Addendum)
 Exercises for Golfer's Elbow Elbow exercises can help you get better if you have golfer's elbow. Only do the exercises you were told to do. Make sure you know how to do the exercises safely. Follow the steps below. It's normal to feel mild discomfort. Stop if you feel pain or your pain gets worse. Do not start these exercises until told by your health care provider. Stretching and range-of-motion exercises These exercises warm up your muscles and joints. They can help your elbow move better and be more flexible. Wrist extension, assisted  Straighten your left / right elbow in front of you with your palm facing up toward the ceiling. If told by your provider, bend your left / right elbow to a 90-degree angle (right angle) at your side. Do this instead of holding it straight. With your other hand, gently pull your left / right hand and fingers toward the floor. Stop when you feel a gentle stretch on the palm side of your forearm. Hold this position for ____30______ seconds. Repeat ______3____ times. Do this exercise ___2_______ times a day. Wrist flexion, assisted  Straighten your left / right elbow in front of you with your palm facing down toward the floor. If told by your provider, bend your left / right elbow to a 90-degree angle at your side. Do this instead of holding it straight. With your other hand, gently push over the back of your left / right hand so your fingers point toward the floor. Stop when you feel a gentle stretch on the back of your forearm. Hold this position for ____30______ seconds. Repeat ___3_______ times. Do this exercise ____2______ times a day. Assisted forearm rotation, supination  Sit or stand with your elbows at your side. Bend your left / right elbow to a 90-degree angle. Using your uninjured hand, turn your left / right palm up toward the ceiling. Stop when you feel a gentle stretch along the inside of your forearm. Hold this position for ____30______  seconds. Repeat ____3______ times. Do this exercise ___2_______ times a day. Assisted forearm rotation, pronation  Sit or stand with your elbows at your side. Bend your left / right elbow to a 90-degree angle. Using your uninjured hand, turn your left / right palm down toward the floor. Stop when you feel a gentle stretch along the top of your forearm. Hold this position for _____30_____ seconds. Repeat ______3____ times. Do this exercise ___2_______ times a day. Strengthening exercises These exercises build strength and endurance in your elbow. Endurance is the ability to use your muscles for a long time, even after they get tired. Wrist flexion  Sit with your left / right forearm supported on a table or other surface. Turn your palm up toward the ceiling. Let your left / right wrist extend over the edge of the surface. Hold a _____none_____ weight or a piece of rubber exercise band or tubing. If using a rubber exercise band or tubing, hold the other end of the tubing with your other hand. Slowly bend your wrist so your hand moves up toward the ceiling. Try to only move your wrist. Keep the rest of your arm still. Hold this position for ____30______ seconds. Slowly go back to the starting position. Repeat ____3______ times. Do this exercise ___2_______ times a day. Wrist flexion, eccentric  Sit with your left / right forearm supported on a table or other surface. Turn your palm up toward the ceiling. Let your left / right wrist extend over the edge of the  surface. Hold a _____none_____ weight or a piece of rubber exercise band or tubing in your left / right hand. If using a rubber exercise band or tubing, hold the other end of the tubing with your other hand. Use your uninjured hand to move your left / right hand up toward the ceiling. Take your other hand away. Slowly go back to the starting position using only your left / right hand. Repeat __3________ times. Do this exercise  ___2_______ times a day. Forearm rotation, pronation To do this exercise, you'll need a lightweight hammer or rubber mallet. Sit with your left / right forearm supported on a table or other surface. Bend your elbow to a 90-degree angle. Have your forearm so that your palm is facing up toward the ceiling, with your hand resting over the edge of the table. Hold a hammer in your left / right hand. To make this exercise easier, hold the hammer near the head of the hammer. To make this exercise harder, hold the hammer near the end of the handle. Without moving your elbow, slowly turn your forearm so your palm faces down toward the floor. Hold this position for ___30_______ seconds. Slowly return to the starting position. Repeat _____3_____ times. Do this exercise ____2______ times a day. Shoulder blade squeeze     Sit in a stable chair or stand with good posture. If you're sitting down, don't let your back touch the back of the chair. Your arms should be at your sides with your elbows bent to a 90-degree angle. Position your forearms so that your thumbs are facing the ceiling. Without lifting your shoulders up, squeeze your shoulder blades tightly together. Hold this position for ____30______ seconds. Slowly release. Go back to the starting position. Repeat ____3______ times. Do this exercise ____2______ times a day. This information is not intended to replace advice given to you by your health care provider. Make sure you discuss any questions you have with your health care provider. Document Revised: 02/02/2023 Document Reviewed: 02/02/2023 Else[employer] Patient Education  2024 [employer]Exercises for Tennis Elbow Elbow exercises can help you get better if you have tennis elbow. Only do the exercises you were told to do. Make sure you know how to do the exercises safely. Follow the steps below. It's normal to feel mild discomfort. Stop if you feel pain or your pain gets worse. Do not start these  exercises until told by your health care provider. Stretching and range-of-motion exercises These exercises warm up your muscles and joints. They can help your elbow move better and be more flexible. Wrist flexion, assisted  Straighten your left / right elbow in front of you with your palm facing down toward the floor. If told by your provider, bend your left / right elbow to a 90-degree angle (right angle) at your side. Do this instead of holding it straight. With your other hand, gently push over the back of your left / right hand so your fingers point toward the floor. Stop when you feel a gentle stretch on the back of your forearm. Hold this position for _____30_____ seconds. Repeat ______3____ times. Do this exercise ____2______ times a day. Wrist extension, assisted  Straighten your left / right elbow in front of you with your palm facing up toward the ceiling. If told by your provider, bend your left / right elbow to a 90-degree angle at your side. Do this instead of holding it straight. With your other hand, gently pull your left / right hand  and fingers toward the floor. Stop when you feel a gentle stretch on the palm side of your forearm. Hold this position for ______30____ seconds. Repeat ______3____ times. Do this exercise _____2_____ times a day. Assisted forearm rotation, supination  Sit or stand with your elbows at your side. Bend your left / right elbow to a 90-degree angle. Using your uninjured hand, turn your left / right palm up toward the ceiling. Stop when you feel a gentle stretch along the inside of your forearm. Hold this position for ____30______ seconds. Repeat ____3______ times. Do this exercise _______2___ times a day. Assisted forearm rotation, pronation  Sit or stand with your elbows at your side. Bend your left / right elbow to a 90-degree angle. Using your uninjured hand, turn your left / right palm down toward the floor. Stop when you feel a gentle stretch  along the outside of your forearm. Hold this position for ____30______ seconds. Repeat _____3_____ times. Do this exercise __2________ times a day. Strengthening exercises These exercises build strength and endurance in your forearm and elbow. Endurance is the ability to use your muscles for a long time, even after they get tired. Radial deviation  Stand with a ____none______ weight or a hammer in your left / right hand. Or, sit while holding a rubber exercise band or tubing, with your left / right forearm supported on a table or counter. Position your forearm so your thumb faces the ceiling, as if you're going to clap your hands. Raise your hand up in front of you so your thumb moves toward the ceiling, or pull up on the rubber tubing. Keep your forearm and elbow still. Only move your wrist. Hold this position for ____30______ seconds. Slowly go back to the starting position. Repeat _____3_____ times. Do this exercise ____2______ times a day. Wrist extension, eccentric  Sit with your left / right forearm palm-down and supported on a table or other surface. Let your left / right wrist extend over the edge of the surface. Hold a ____none______ weight or a piece of exercise band or tubing in your left / right hand. If using a rubber exercise band or tubing, hold the other end of the tubing with your other hand. Use your uninjured hand to move your left / right hand up toward the ceiling. Take your uninjured hand away. Slowly go back to the starting position using only your left / right hand. Repeat ____3______ times. Do this exercise ___2_______ times a day. Wrist extension Do not do this exercise if it causes pain at the outside of your elbow. Only do this exercise if told. Sit with your left / right forearm supported on a table or other surface and your palm turned down toward the floor. Let your left / right wrist extend over the edge of the surface. Hold a ____none______ weight or a piece  of rubber exercise band or tubing. If using a rubber exercise band or tubing, hold the band or tubing in place with your other hand to provide resistance. Slowly bend your wrist so your hand moves up toward the ceiling. Move only your wrist. Keep your forearm and elbow still. Hold this position for _____30_____ seconds. Slowly go back to the starting position. Repeat _____3_____ times. Do this exercise ____2______ times a day. Forearm rotation, supination To do this exercise, you'll need a lightweight hammer or rubber mallet. Sit with your left / right forearm supported on a table or other surface. Bend your elbow to a 90-degree angle.  Position your forearm so that your palm faces down toward the floor, with your hand resting over the edge of the table. Hold a hammer in your left / right hand. To make this exercise easier, hold the hammer near the head of the hammer. To make this exercise harder, hold the hammer near the end of the handle. Without moving your wrist or elbow, slowly turn your forearm so your palm faces up toward the ceiling. Hold this position for ____30______ seconds. Slowly go back to the starting position. Repeat ____3______ times. Do this exercise _____2_____ times a day. Shoulder blade squeeze  Sit in a stable chair or stand with good posture. If you're sitting down, don't let your back touch the back of the chair. Your arms should be at your sides with your elbows bent to a 90-degree angle. Position your forearms so that your thumbs face the ceiling. Without lifting your shoulders up, squeeze your shoulder blades tightly together. Hold this position for _____30_____ seconds. Slowly release. Go back to the starting position. Repeat ______3____ times. Do this exercise ______2____ times a day. This information is not intended to replace advice given to you by your health care provider. Make sure you discuss any questions you have with your health care provider. Document  Revised: 02/02/2023 Document Reviewed: 02/02/2023 Elsevier Patient Education  2024 [employer]Tennis Elbow: What to Know  Tennis elbow, also called lateral epicondylitis, is the inflammation of the tendons in your outer forearm, near your elbow. Tendons are tissues that connect muscle to bone. When you have tennis elbow, the tendons you use to bend your wrist and hand backward get inflamed. Tennis elbow often affects people who play tennis, but it can happen to anyone who moves their wrist, arm, or hands in the same way over and over. What are the causes? Tennis elbow is often caused by: Repeatedly extending the wrist, turning the forearm, or using the hands. Playing sports or doing tasks that involve repetitive forearm movements. Sudden injury. What increases the risk? You're more likely to get tennis elbow if you play tennis or another racket sport. But, it can affect anyone who uses their hands a lot. Other people at greater risk include: People who use computers. Holiday representative workers. Factory workers. Musicians. Cooks. Cashiers. What are the signs or symptoms? Pain and tenderness along the outside of your forearm and elbow. It might hurt all of the time or only when you use your arm. A burning feeling that starts in the elbow and spreads down the arm. A weak grip. How is this diagnosed? This condition is diagnosed based on your symptoms, medical history, and a physical exam. You may also have X-rays or an MRI to: Confirm the diagnosis. Look for other issues. Check for tears in the ligaments, muscles, or tendons. How is this treated? Treatment may include: Resting and icing the arm. Taking NSAIDs, such as ibuprofen . Getting steroid shots to lessen pain and swelling. Doing physical therapy exercises to improve movement and strength in your arm. Wearing an elbow brace or wrist strap to limit movements that cause symptoms. Surgery, if other treatments don't work. This is  rare. Follow these instructions at home: If you have a brace or strap: Wear the brace or strap as told. Take it off only if your health care provider says you can. Check the skin around it every day. Tell your provider if you see problems. Loosen the brace or strap if your fingers tingle, become numb, or turn cold and blue.  Keep the brace clean. If the brace or strap isn't waterproof: Do not let it get wet. Cover it when you take a bath or a shower. Use a cover that doesn't let any water in. Managing pain, stiffness, and swelling  Use ice or an ice pack as told. If you have a brace or strap that you can take off, remove it only as told. Put ice in a plastic bag. Place a towel between your skin and the ice. Leave the ice on for 20 minutes, 2-3 times a day. If your skin turns red, take off the ice right away to prevent skin damage. The risk of damage is higher if you can't feel pain, heat, or cold. Move your fingers often to reduce stiffness and swelling. Raise your arm above the level of your heart while you're sitting or lying down. Use pillows as needed. Activity Rest your elbow and wrist. Avoid the activity that causes your symptoms. Do physical therapy exercises as told by your provider. Lift objects with the palm of your hand facing up. This lessens stress on your elbow. Lifestyle If your tennis elbow is caused by sports, check your sports equipment. Make sure you're using it right and it fits well. If your tennis elbow is caused by work or computer use, take breaks often to stretch your arm. Talk with your employer about how to make your condition better at work. General instructions Take your medicines only as told. Do not smoke, vape, or use nicotine or tobacco. Doing this can slow down healing. How is this prevented? Warm up and stretch before being active. Cool down and stretch after being active. Give your body time to rest. Use equipment that fits you. If you play  tennis, put power in your stroke with your lower body. Avoid using your arm only. Stay fit. Keep your body strong and flexible. Do exercises to strengthen the forearm muscles. Contact a health care provider if: You have pain that gets worse. Your pain doesn't get better with treatment. You have numbness or weakness in your forearm, hand, or fingers. Get help right away if: Your pain is very bad. You can't move your wrist. This information is not intended to replace advice given to you by your health care provider. Make sure you discuss any questions you have with your health care provider. Document Revised: 03/06/2023 Document Reviewed: 03/06/2023 Elsevier Patient Education  2024 [employer]Golfer's Elbow: What to Know  Golfer's elbow, or medial epicondylitis, is caused by swelling or damage to some of the tendons in your elbow. Tendons are the strong tissues that connect muscles to bones. Golfer's elbow affects the tendons that connect to the muscles that help you bend your palm toward your wrist. These tendons get less flexible as you get older. Golfer's elbow is more common among people who bend and twist their wrists a lot, such as golfers. What are the causes? Golfer's elbow is caused by: Flexing, turning, or twisting your wrist over and over. Often gripping things with your hands. Sudden injury. What increases the risk? You're more likely to get golfer's elbow if: You play certain sports, such as: Systems analyst. Baseball. Tennis. You have to use your hands a lot at your job. This includes if you use computers at work or if you're: A Music therapist. A butcher. A musician. What are the signs or symptoms? Symptoms include: Pain in: Your elbow. Your forearm. Your wrist. A weak grip in the hand. The pain may get worse when you bend  your wrist downward. How is this diagnosed? Golfer's elbow is diagnosed based on your symptoms, your medical history, and an exam. During the exam, your  health care provider may: Test your grip strength. Move your wrist to check for pain. You may also have an MRI to: See if you have golfer's elbow. Look for other issues. Check for tears in your muscles or tendons. How is this treated? You may need to: Stop doing things that make you bend or twist your elbow or wrist. Wait until the pain is all gone before doing those things again. Wear an elbow brace or wrist splint. Ice your inner elbow, forearm, or wrist. Take NSAIDs, such as ibuprofen , or get steroid shots. These can help with pain and swelling. Do physical therapy as told by your provider. In rare cases, you may need surgery if you aren't getting better. Follow these instructions at home: If you have a brace or splint: Wear the brace or splint as told. Take it off only if your provider says you can. Check the skin around it every day. Tell your provider if you see problems. Loosen the brace or splint if your fingers tingle, are numb, or turn cold and blue. Keep the brace or splint clean. If the brace or splint isn't waterproof: Do not let it get wet. Cover it when you take a bath or shower. Use a cover that doesn't let any water in. Managing pain, stiffness, and swelling  Use ice or an ice pack as told. If you have a splint or brace that you can take off, remove it only as told. Place a towel between your skin and the ice. Leave the ice on for 20 minutes, 2-3 times a day. If your skin turns red, take off the ice right away to prevent skin damage. The risk of damage is higher if you can't feel pain, heat, or cold. Move your fingers often to avoid stiffness and swelling. Raise your arm above the level of your heart while you're sitting or lying down. Use pillows as needed. Activity Rest your arm as told. Exercise as told. Ask what things are safe for you to do at home. Ask when you can go back to work or school. Lifestyle If you got golfer's elbow from playing sports, work  with a Psychologist, educational. They can make sure that you: Use the right technique. Use the right equipment. If you got golfer's elbow from working, talk with your work about how to manage your condition. General instructions Take your medicines only as told. Do not smoke, vape, or use nicotine or tobacco. How is this prevented? Warm up and stretch before being active. Cool down and stretch after being active. Give your body time to rest. Stay fit. Keep your body strong and flexible. Use sports equipment that fits you. If you play golf, slow your golf swing. This can help reduce shock in the arm when making contact with the ball. Contact a health care provider if: Your pain doesn't get better. Your pain gets worse. Your hand goes numb. Get help right away if: You have very bad pain. You can't move your wrist. This information is not intended to replace advice given to you by your health care provider. Make sure you discuss any questions you have with your health care provider. Document Revised: 03/06/2023 Document Reviewed: 03/06/2023 Elsevier Patient Education  2024 [employer].

## 2024-06-03 ENCOUNTER — Ambulatory Visit: Admitting: Emergency Medicine

## 2024-06-03 ENCOUNTER — Encounter: Payer: Self-pay | Admitting: Emergency Medicine

## 2024-06-03 ENCOUNTER — Ambulatory Visit: Payer: Self-pay | Admitting: Emergency Medicine

## 2024-06-03 ENCOUNTER — Ambulatory Visit (INDEPENDENT_AMBULATORY_CARE_PROVIDER_SITE_OTHER)

## 2024-06-03 VITALS — BP 160/80 | HR 65 | Temp 98.1°F | Ht 70.0 in | Wt 194.0 lb

## 2024-06-03 DIAGNOSIS — Z23 Encounter for immunization: Secondary | ICD-10-CM | POA: Diagnosis not present

## 2024-06-03 DIAGNOSIS — I152 Hypertension secondary to endocrine disorders: Secondary | ICD-10-CM | POA: Diagnosis not present

## 2024-06-03 DIAGNOSIS — F17200 Nicotine dependence, unspecified, uncomplicated: Secondary | ICD-10-CM | POA: Diagnosis not present

## 2024-06-03 DIAGNOSIS — E1169 Type 2 diabetes mellitus with other specified complication: Secondary | ICD-10-CM | POA: Diagnosis not present

## 2024-06-03 DIAGNOSIS — I10 Essential (primary) hypertension: Secondary | ICD-10-CM

## 2024-06-03 DIAGNOSIS — C20 Malignant neoplasm of rectum: Secondary | ICD-10-CM

## 2024-06-03 DIAGNOSIS — E785 Hyperlipidemia, unspecified: Secondary | ICD-10-CM | POA: Diagnosis not present

## 2024-06-03 DIAGNOSIS — E1159 Type 2 diabetes mellitus with other circulatory complications: Secondary | ICD-10-CM | POA: Diagnosis not present

## 2024-06-03 DIAGNOSIS — R918 Other nonspecific abnormal finding of lung field: Secondary | ICD-10-CM | POA: Diagnosis not present

## 2024-06-03 LAB — MICROALBUMIN / CREATININE URINE RATIO
Creatinine,U: 75.1 mg/dL
Microalb Creat Ratio: 9.8 mg/g (ref 0.0–30.0)
Microalb, Ur: 0.7 mg/dL (ref 0.0–1.9)

## 2024-06-03 LAB — CBC WITH DIFFERENTIAL/PLATELET
Basophils Absolute: 0.1 K/uL (ref 0.0–0.1)
Basophils Relative: 0.9 % (ref 0.0–3.0)
Eosinophils Absolute: 0.5 K/uL (ref 0.0–0.7)
Eosinophils Relative: 5.1 % — ABNORMAL HIGH (ref 0.0–5.0)
HCT: 48.9 % (ref 39.0–52.0)
Hemoglobin: 16.5 g/dL (ref 13.0–17.0)
Lymphocytes Relative: 31.1 % (ref 12.0–46.0)
Lymphs Abs: 2.9 K/uL (ref 0.7–4.0)
MCHC: 33.7 g/dL (ref 30.0–36.0)
MCV: 94 fl (ref 78.0–100.0)
Monocytes Absolute: 0.6 K/uL (ref 0.1–1.0)
Monocytes Relative: 5.8 % (ref 3.0–12.0)
Neutro Abs: 5.4 K/uL (ref 1.4–7.7)
Neutrophils Relative %: 57.1 % (ref 43.0–77.0)
Platelets: 279 K/uL (ref 150.0–400.0)
RBC: 5.2 Mil/uL (ref 4.22–5.81)
RDW: 14 % (ref 11.5–15.5)
WBC: 9.4 K/uL (ref 4.0–10.5)

## 2024-06-03 LAB — COMPREHENSIVE METABOLIC PANEL WITH GFR
ALT: 20 U/L (ref 0–53)
AST: 18 U/L (ref 0–37)
Albumin: 4.7 g/dL (ref 3.5–5.2)
Alkaline Phosphatase: 51 U/L (ref 39–117)
BUN: 16 mg/dL (ref 6–23)
CO2: 29 meq/L (ref 19–32)
Calcium: 9.9 mg/dL (ref 8.4–10.5)
Chloride: 103 meq/L (ref 96–112)
Creatinine, Ser: 1.04 mg/dL (ref 0.40–1.50)
GFR: 70.89 mL/min (ref >60.00)
Glucose, Bld: 110 mg/dL — ABNORMAL HIGH (ref 70–99)
Potassium: 4.5 meq/L (ref 3.5–5.1)
Sodium: 138 meq/L (ref 135–145)
Total Bilirubin: 0.5 mg/dL (ref 0.2–1.2)
Total Protein: 7.3 g/dL (ref 6.0–8.3)

## 2024-06-03 LAB — LIPID PANEL
Cholesterol: 120 mg/dL (ref 0–200)
HDL: 42.7 mg/dL (ref >39.00)
LDL Cholesterol: 54 mg/dL (ref 0–99)
NonHDL: 77.05
Total CHOL/HDL Ratio: 3
Triglycerides: 114 mg/dL (ref 0.0–149.0)
VLDL: 22.8 mg/dL (ref 0.0–40.0)

## 2024-06-03 MED ORDER — AMLODIPINE BESYLATE 5 MG PO TABS
5.0000 mg | ORAL_TABLET | Freq: Every day | ORAL | 3 refills | Status: AC
Start: 2024-06-03 — End: 2025-05-29

## 2024-06-03 MED ORDER — ROSUVASTATIN CALCIUM 20 MG PO TABS: 20.0000 mg | ORAL_TABLET | Freq: Every day | ORAL | 3 refills | Status: AC

## 2024-06-03 MED ORDER — VALSARTAN-HYDROCHLOROTHIAZIDE 160-12.5 MG PO TABS: 1.0000 | ORAL_TABLET | Freq: Every day | ORAL | 3 refills | Status: AC

## 2024-06-03 NOTE — Assessment & Plan Note (Signed)
 BP Readings from Last 3 Encounters:  06/03/24 (!) 160/80  02/29/24 122/60  11/27/23 (!) 152/74  Elevated blood pressure reading in the office Advised to monitor blood pressure at home daily for the next couple of weeks and contact the office if numbers persistently abnormal Cardiovascular risks associated with hypertension and diabetes discussed Continue amlodipine  5 mg daily and valsartan  HCT 160-12.5 mg daily May need to adjust doses Hemoglobin A1c done today Not taking Jardiance  at present time Diet and nutrition discussed

## 2024-06-03 NOTE — Patient Instructions (Signed)
 Health Maintenance After Age 74 After age 27, you are at a higher risk for certain long-term diseases and infections as well as injuries from falls. Falls are a major cause of broken bones and head injuries in people who are older than age 73. Getting regular preventive care can help to keep you healthy and well. Preventive care includes getting regular testing and making lifestyle changes as recommended by your health care provider. Talk with your health care provider about: Which screenings and tests you should have. A screening is a test that checks for a disease when you have no symptoms. A diet and exercise plan that is right for you. What should I know about screenings and tests to prevent falls? Screening and testing are the best ways to find a health problem early. Early diagnosis and treatment give you the best chance of managing medical conditions that are common after age 90. Certain conditions and lifestyle choices may make you more likely to have a fall. Your health care provider may recommend: Regular vision checks. Poor vision and conditions such as cataracts can make you more likely to have a fall. If you wear glasses, make sure to get your prescription updated if your vision changes. Medicine review. Work with your health care provider to regularly review all of the medicines you are taking, including over-the-counter medicines. Ask your health care provider about any side effects that may make you more likely to have a fall. Tell your health care provider if any medicines that you take make you feel dizzy or sleepy. Strength and balance checks. Your health care provider may recommend certain tests to check your strength and balance while standing, walking, or changing positions. Foot health exam. Foot pain and numbness, as well as not wearing proper footwear, can make you more likely to have a fall. Screenings, including: Osteoporosis screening. Osteoporosis is a condition that causes  the bones to get weaker and break more easily. Blood pressure screening. Blood pressure changes and medicines to control blood pressure can make you feel dizzy. Depression screening. You may be more likely to have a fall if you have a fear of falling, feel depressed, or feel unable to do activities that you used to do. Alcohol  use screening. Using too much alcohol  can affect your balance and may make you more likely to have a fall. Follow these instructions at home: Lifestyle Do not drink alcohol  if: Your health care provider tells you not to drink. If you drink alcohol : Limit how much you have to: 0-1 drink a day for women. 0-2 drinks a day for men. Know how much alcohol  is in your drink. In the [place] one drink equals one 12 oz bottle of beer (355 mL), one 5 oz glass of wine (148 mL), or one 1 oz glass of hard liquor (44 mL). Do not use any products that contain nicotine or tobacco. These products include cigarettes, chewing tobacco, and vaping devices, such as e-cigarettes. If you need help quitting, ask your health care provider. Activity  Follow a regular exercise program to stay fit. This will help you maintain your balance. Ask your health care provider what types of exercise are appropriate for you. If you need a cane or walker, use it as recommended by your health care provider. Wear supportive shoes that have nonskid soles. Safety  Remove any tripping hazards, such as rugs, cords, and clutter. Install safety equipment such as grab bars in bathrooms and safety rails on stairs. Keep rooms and walkways  well-lit. General instructions Talk with your health care provider about your risks for falling. Tell your health care provider if: You fall. Be sure to tell your health care provider about all falls, even ones that seem minor. You feel dizzy, tiredness (fatigue), or off-balance. Take over-the-counter and prescription medicines only as told by your health care provider. These include  supplements. Eat a healthy diet and maintain a healthy weight. A healthy diet includes low-fat dairy products, low-fat (lean) meats, and fiber from whole grains, beans, and lots of fruits and vegetables. Stay current with your vaccines. Schedule regular health, dental, and eye exams. Summary Having a healthy lifestyle and getting preventive care can help to protect your health and wellness after age 15. Screening and testing are the best way to find a health problem early and help you avoid having a fall. Early diagnosis and treatment give you the best chance for managing medical conditions that are more common for people who are older than age 42. Falls are a major cause of broken bones and head injuries in people who are older than age 64. Take precautions to prevent a fall at home. Work with your health care provider to learn what changes you can make to improve your health and wellness and to prevent falls. This information is not intended to replace advice given to you by your health care provider. Make sure you discuss any questions you have with your health care provider. Document Revised: 11/30/2020 Document Reviewed: 11/30/2020 Elsevier Patient Education  2024 [employer].

## 2024-06-03 NOTE — Progress Notes (Signed)
 David Jacobs 73 y.o.   Chief Complaint  Patient presents with   Follow-up    HISTORY OF PRESENT ILLNESS: This is a 73 y.o. male here for follow-up of multiple chronic medical conditions Accompanied by daughter who is helping with translation Overall doing well. Eating better and losing weight Still smoking No other complaints or medical concerns today.  HPI   Prior to Admission medications   Medication Sig Start Date End Date Taking? Authorizing Provider  amLODipine  (NORVASC ) 5 MG tablet Take 1 tablet (5 mg total) by mouth daily. 05/29/23 06/03/24 Yes Darcella Shiffman, Emil Schanz, MD  rosuvastatin  (CRESTOR ) 20 MG tablet Take 1 tablet (20 mg total) by mouth daily. 05/29/23  Yes Kalmen Lollar, Emil Schanz, MD  valsartan -hydrochlorothiazide  (DIOVAN -HCT) 160-12.5 MG tablet Take 1 tablet by mouth daily. 05/29/23  Yes Sereniti Wan, Emil Schanz, MD  empagliflozin  (JARDIANCE ) 10 MG TABS tablet Take 1 tablet (10 mg total) by mouth daily before breakfast. Patient not taking: Reported on 06/03/2024 08/10/22   Johnna Bollier Jose, MD    No Known Allergies  Patient Active Problem List   Diagnosis Date Noted   Dyslipidemia associated with type 2 diabetes mellitus (HCC) 08/10/2022   Current smoker 05/10/2022   Atherosclerosis of aorta 04/27/2020   Dyslipidemia 03/30/2018   Diet-controlled diabetes mellitus (HCC) 03/30/2018   Rectal cancer (HCC) 12/07/2015   Hypertension associated with diabetes (HCC) 11/15/2015    Past Medical History:  Diagnosis Date   Hypertension     Past Surgical History:  Procedure Laterality Date   EUS N/A 12/09/2015   Procedure: LOWER ENDOSCOPIC ULTRASOUND (EUS);  Surgeon: Elsie Cree, MD;  Location: [facility];  Service: Endoscopy;  Laterality: N/A;   XI ROBOTIC ASSISTED LOWER ANTERIOR RESECTION N/A 03/25/2016   Procedure: XI ROBOTIC ASSISTED LOWER ANTERIOR RESECTION;  Surgeon: Bernarda Ned, MD;  Location: [facility];  Service: General;  Laterality: N/A;    Social  History   Socioeconomic History   Marital status: Married    Spouse name: Not on file   Number of children: 2   Years of education: Not on file   Highest education level: Not on file  Occupational History   Not on file  Tobacco Use   Smoking status: Every Day    Current packs/day: 0.50    Average packs/day: 0.5 packs/day for 35.0 years (17.5 ttl pk-yrs)    Types: Cigarettes   Smokeless tobacco: Never  Substance and Sexual Activity   Alcohol use: No    Alcohol/week: 2.0 - 3.0 standard drinks of alcohol    Types: 2 - 3 Cans of beer per week   Drug use: No   Sexual activity: Not on file  Other Topics Concern   Not on file  Social History Narrative   Married, lives w/his wife and a son--minimal English   Works full time at [employer]   Current everyday smoker, with no plans to quit   Primary contact is daughter-in-law, Ridwan Bondy (can interpret for him)   Social Drivers of Corporate Investment Banker Strain: Not on file  Food Insecurity: Not on file  Transportation Needs: Not on file  Physical Activity: Not on file  Stress: Not on file  Social Connections: Not on file  Intimate Partner Violence: Not on file    Family History  Problem Relation Age of Onset   Cancer Mother 88       throid cancer    Hyperlipidemia Mother    Diabetes Father      Review  of Systems  Constitutional: Negative.  Negative for chills and fever.  HENT: Negative.  Negative for congestion and sore throat.   Respiratory: Negative.  Negative for cough and shortness of breath.   Cardiovascular: Negative.  Negative for chest pain and palpitations.  Gastrointestinal:  Negative for abdominal pain, diarrhea, nausea and vomiting.  Genitourinary: Negative.  Negative for dysuria and hematuria.  Skin: Negative.  Negative for rash.  Neurological: Negative.  Negative for dizziness and headaches.  All other systems reviewed and are negative.   Vitals:   06/03/24 1110 06/03/24 1114  BP:  (!) 160/80 (!) 160/80  Pulse: 65   Temp: 98.1 F (36.7 C)   SpO2: 97%     Physical Exam Vitals reviewed.  Constitutional:      Appearance: Normal appearance.  HENT:     Head: Normocephalic.     Right Ear: Tympanic membrane, ear canal and external ear normal.     Left Ear: Tympanic membrane, ear canal and external ear normal.     Mouth/Throat:     Mouth: Mucous membranes are moist.     Pharynx: Oropharynx is clear.  Eyes:     Extraocular Movements: Extraocular movements intact.     Pupils: Pupils are equal, round, and reactive to light.  Cardiovascular:     Rate and Rhythm: Normal rate and regular rhythm.     Pulses: Normal pulses.     Heart sounds: Normal heart sounds.  Pulmonary:     Effort: Pulmonary effort is normal.     Breath sounds: Normal breath sounds.  Abdominal:     Palpations: Abdomen is soft.     Tenderness: There is no abdominal tenderness.  Musculoskeletal:     Cervical back: No tenderness.  Lymphadenopathy:     Cervical: No cervical adenopathy.  Skin:    General: Skin is warm and dry.     Capillary Refill: Capillary refill takes less than 2 seconds.  Neurological:     General: No focal deficit present.     Mental Status: He is alert and oriented to person, place, and time.  Psychiatric:        Mood and Affect: Mood normal.        Behavior: Behavior normal.    DG Chest 2 View Result Date: 06/03/2024 CLINICAL DATA:  Chronic smoker. EXAM: CHEST - 2 VIEW COMPARISON:  11/15/2015 FINDINGS: Lungs are hyperexpanded. Subtle ground-glass opacity identified left base. No dense focal consolidative opacity. No pulmonary edema or pleural effusion. The cardiopericardial silhouette is within normal limits for size. No acute bony abnormality. IMPRESSION: Subtle ground-glass opacity at the left base. This could be atelectasis or pneumonia. Follow-up recommended to ensure resolution. Electronically Signed   By: Camellia Candle M.D.   On: 06/03/2024 12:46     ASSESSMENT &  PLAN: A total of 42 minutes was spent with the patient and counseling/coordination of care regarding preparing for this visit, review of most recent office visit notes, review of multiple chronic medical conditions and their management, review of all medications, review of most recent bloodwork results, review of health maintenance items, education on nutrition, prognosis, documentation, and need for follow up.   Problem List Items Addressed This Visit       Cardiovascular and Mediastinum   Hypertension associated with diabetes (HCC) - Primary   BP Readings from Last 3 Encounters:  06/03/24 (!) 160/80  02/29/24 122/60  11/27/23 (!) 152/74  Elevated blood pressure reading in the office Advised to monitor blood pressure at  home daily for the next couple of weeks and contact the office if numbers persistently abnormal Cardiovascular risks associated with hypertension and diabetes discussed Continue amlodipine  5 mg daily and valsartan  HCT 160-12.5 mg daily May need to adjust doses Hemoglobin A1c done today Not taking Jardiance  at present time Diet and nutrition discussed       Relevant Medications   amLODipine  (NORVASC ) 5 MG tablet   rosuvastatin  (CRESTOR ) 20 MG tablet   valsartan -hydrochlorothiazide  (DIOVAN -HCT) 160-12.5 MG tablet   Other Relevant Orders   Comprehensive metabolic panel with GFR   CBC with Differential/Platelet   Lipid panel     Digestive   Rectal cancer (HCC)   History of rectal cancer. No concerns         Endocrine   Dyslipidemia associated with type 2 diabetes mellitus (HCC)   Chronic stable conditions Will do hemoglobin A1c today Continue rosuvastatin  20 mg daily      Relevant Medications   rosuvastatin  (CRESTOR ) 20 MG tablet   valsartan -hydrochlorothiazide  (DIOVAN -HCT) 160-12.5 MG tablet   Other Relevant Orders   Comprehensive metabolic panel with GFR   CBC with Differential/Platelet   Lipid panel     Other   Dyslipidemia   Relevant  Medications   rosuvastatin  (CRESTOR ) 20 MG tablet   Current smoker   Cardiovascular and cancer risks associated with smoking discussed Smoking cessation advice given Still smoking and not motivated to quit. Recommend chest x-ray today      Relevant Orders   DG Chest 2 View   Other Visit Diagnoses       Essential hypertension       Relevant Medications   amLODipine  (NORVASC ) 5 MG tablet   rosuvastatin  (CRESTOR ) 20 MG tablet   valsartan -hydrochlorothiazide  (DIOVAN -HCT) 160-12.5 MG tablet      Patient Instructions  Health Maintenance After Age 67 After age 80, you are at a higher risk for certain long-term diseases and infections as well as injuries from falls. Falls are a major cause of broken bones and head injuries in people who are older than age 22. Getting regular preventive care can help to keep you healthy and well. Preventive care includes getting regular testing and making lifestyle changes as recommended by your health care provider. Talk with your health care provider about: Which screenings and tests you should have. A screening is a test that checks for a disease when you have no symptoms. A diet and exercise plan that is right for you. What should I know about screenings and tests to prevent falls? Screening and testing are the best ways to find a health problem early. Early diagnosis and treatment give you the best chance of managing medical conditions that are common after age 46. Certain conditions and lifestyle choices may make you more likely to have a fall. Your health care provider may recommend: Regular vision checks. Poor vision and conditions such as cataracts can make you more likely to have a fall. If you wear glasses, make sure to get your prescription updated if your vision changes. Medicine review. Work with your health care provider to regularly review all of the medicines you are taking, including over-the-counter medicines. Ask your health care provider  about any side effects that may make you more likely to have a fall. Tell your health care provider if any medicines that you take make you feel dizzy or sleepy. Strength and balance checks. Your health care provider may recommend certain tests to check your strength and balance while standing, walking, or  changing positions. Foot health exam. Foot pain and numbness, as well as not wearing proper footwear, can make you more likely to have a fall. Screenings, including: Osteoporosis screening. Osteoporosis is a condition that causes the bones to get weaker and break more easily. Blood pressure screening. Blood pressure changes and medicines to control blood pressure can make you feel dizzy. Depression screening. You may be more likely to have a fall if you have a fear of falling, feel depressed, or feel unable to do activities that you used to do. Alcohol use screening. Using too much alcohol can affect your balance and may make you more likely to have a fall. Follow these instructions at home: Lifestyle Do not drink alcohol if: Your health care provider tells you not to drink. If you drink alcohol: Limit how much you have to: 0-1 drink a day for women. 0-2 drinks a day for men. Know how much alcohol is in your drink. In the [place] one drink equals one 12 oz bottle of beer (355 mL), one 5 oz glass of wine (148 mL), or one 1 oz glass of hard liquor (44 mL). Do not use any products that contain nicotine or tobacco. These products include cigarettes, chewing tobacco, and vaping devices, such as e-cigarettes. If you need help quitting, ask your health care provider. Activity  Follow a regular exercise program to stay fit. This will help you maintain your balance. Ask your health care provider what types of exercise are appropriate for you. If you need a cane or walker, use it as recommended by your health care provider. Wear supportive shoes that have nonskid soles. Safety  Remove any tripping  hazards, such as rugs, cords, and clutter. Install safety equipment such as grab bars in bathrooms and safety rails on stairs. Keep rooms and walkways well-lit. General instructions Talk with your health care provider about your risks for falling. Tell your health care provider if: You fall. Be sure to tell your health care provider about all falls, even ones that seem minor. You feel dizzy, tiredness (fatigue), or off-balance. Take over-the-counter and prescription medicines only as told by your health care provider. These include supplements. Eat a healthy diet and maintain a healthy weight. A healthy diet includes low-fat dairy products, low-fat (lean) meats, and fiber from whole grains, beans, and lots of fruits and vegetables. Stay current with your vaccines. Schedule regular health, dental, and eye exams. Summary Having a healthy lifestyle and getting preventive care can help to protect your health and wellness after age 23. Screening and testing are the best way to find a health problem early and help you avoid having a fall. Early diagnosis and treatment give you the best chance for managing medical conditions that are more common for people who are older than age 57. Falls are a major cause of broken bones and head injuries in people who are older than age 75. Take precautions to prevent a fall at home. Work with your health care provider to learn what changes you can make to improve your health and wellness and to prevent falls. This information is not intended to replace advice given to you by your health care provider. Make sure you discuss any questions you have with your health care provider. Document Revised: 11/30/2020 Document Reviewed: 11/30/2020 Elsevier Patient Education  2024 [employer]       Emil Schaumann, MD [facility]

## 2024-06-03 NOTE — Assessment & Plan Note (Signed)
History of rectal cancer.  No concerns.

## 2024-06-03 NOTE — Assessment & Plan Note (Signed)
 Cardiovascular and cancer risks associated with smoking discussed Smoking cessation advice given Still smoking and not motivated to quit. Recommend chest x-ray today

## 2024-06-03 NOTE — Assessment & Plan Note (Signed)
 Chronic stable conditions Will do hemoglobin A1c today Continue rosuvastatin  20 mg daily

## 2024-07-26 ENCOUNTER — Encounter: Payer: Self-pay | Admitting: *Deleted

## 2024-07-26 NOTE — Progress Notes (Signed)
 David Jacobs                                          MRN: [id number]   07/26/2024   The [facility] Quality Team Specialist reviewed this patient medical record for the purposes of chart review for care gap closure. The following were reviewed: abstraction for care gap closure-kidney health evaluation for diabetes:eGFR  and uACR.    [facility] Quality Team

## 2024-10-08 ENCOUNTER — Ambulatory Visit

## 2024-12-02 ENCOUNTER — Ambulatory Visit: Admitting: Emergency Medicine
